# Patient Record
Sex: Female | Born: 1940 | Race: White | Hispanic: No | Marital: Single | Smoking: Never smoker
Health system: Southern US, Community
[De-identification: ages and names within clinical notes are randomized; demographics above are authoritative.]

## PROBLEM LIST (undated history)

## (undated) DIAGNOSIS — H9190 Unspecified hearing loss, unspecified ear: Secondary | ICD-10-CM

## (undated) DIAGNOSIS — R7989 Other specified abnormal findings of blood chemistry: Secondary | ICD-10-CM

## (undated) DIAGNOSIS — E785 Hyperlipidemia, unspecified: Secondary | ICD-10-CM

## (undated) DIAGNOSIS — R0609 Other forms of dyspnea: Secondary | ICD-10-CM

## (undated) DIAGNOSIS — I83813 Varicose veins of bilateral lower extremities with pain: Secondary | ICD-10-CM

## (undated) DIAGNOSIS — N183 Chronic kidney disease, stage 3 unspecified: Secondary | ICD-10-CM

## (undated) DIAGNOSIS — E559 Vitamin D deficiency, unspecified: Secondary | ICD-10-CM

## (undated) DIAGNOSIS — M8580 Other specified disorders of bone density and structure, unspecified site: Secondary | ICD-10-CM

## (undated) DIAGNOSIS — G4762 Sleep related leg cramps: Secondary | ICD-10-CM

## (undated) DIAGNOSIS — I10 Essential (primary) hypertension: Secondary | ICD-10-CM

## (undated) DIAGNOSIS — E2839 Other primary ovarian failure: Secondary | ICD-10-CM

## (undated) DIAGNOSIS — F4320 Adjustment disorder, unspecified: Secondary | ICD-10-CM

## (undated) DIAGNOSIS — G479 Sleep disorder, unspecified: Secondary | ICD-10-CM

## (undated) DIAGNOSIS — E538 Deficiency of other specified B group vitamins: Secondary | ICD-10-CM

## (undated) DIAGNOSIS — M19049 Primary osteoarthritis, unspecified hand: Secondary | ICD-10-CM

## (undated) DIAGNOSIS — Z91018 Allergy to other foods: Secondary | ICD-10-CM

## (undated) DIAGNOSIS — K219 Gastro-esophageal reflux disease without esophagitis: Secondary | ICD-10-CM

## (undated) DIAGNOSIS — K589 Irritable bowel syndrome without diarrhea: Secondary | ICD-10-CM

## (undated) DIAGNOSIS — M1990 Unspecified osteoarthritis, unspecified site: Secondary | ICD-10-CM

## (undated) DIAGNOSIS — K52831 Collagenous colitis: Secondary | ICD-10-CM

## (undated) HISTORY — DX: Hyperlipidemia, unspecified: E78.5

## (undated) HISTORY — DX: Deficiency of other specified B group vitamins: E53.8

## (undated) HISTORY — DX: Sleep related leg cramps: G47.62

## (undated) HISTORY — DX: Other forms of dyspnea: R06.09

## (undated) HISTORY — DX: Irritable bowel syndrome, unspecified: K58.9

## (undated) HISTORY — DX: Gastro-esophageal reflux disease without esophagitis: K21.9

## (undated) HISTORY — DX: Other specified abnormal findings of blood chemistry: R79.89

## (undated) HISTORY — DX: Sleep disorder, unspecified: G47.9

## (undated) HISTORY — PX: VAGINAL HYSTERECTOMY: SUR661

## (undated) HISTORY — DX: Adjustment disorder, unspecified: F43.20

## (undated) HISTORY — DX: Other primary ovarian failure: E28.39

## (undated) HISTORY — DX: Collagenous colitis: K52.831

## (undated) HISTORY — DX: Other specified disorders of bone density and structure, unspecified site: M85.80

## (undated) HISTORY — DX: Celiac disease: K90.0

## (undated) HISTORY — DX: Vitamin D deficiency, unspecified: E55.9

## (undated) HISTORY — DX: Chronic kidney disease, stage 3 unspecified: N18.30

## (undated) HISTORY — DX: Essential (primary) hypertension: I10

## (undated) HISTORY — DX: Primary osteoarthritis, unspecified hand: M19.049

## (undated) HISTORY — DX: Allergy to other foods: Z91.018

## (undated) HISTORY — DX: Varicose veins of bilateral lower extremities with pain: I83.813

## (undated) HISTORY — DX: Unspecified hearing loss, unspecified ear: H91.90

---

## 1998-03-17 ENCOUNTER — Observation Stay (HOSPITAL_COMMUNITY): Admission: RE | Admit: 1998-03-17 | Discharge: 1998-03-18 | Payer: Self-pay | Admitting: General Surgery

## 1999-02-06 HISTORY — PX: CATARACT EXTRACTION W/ INTRAOCULAR LENS  IMPLANT, BILATERAL: SHX1307

## 1999-02-06 HISTORY — PX: CHOLECYSTECTOMY: SHX55

## 2000-04-27 ENCOUNTER — Other Ambulatory Visit: Admission: RE | Admit: 2000-04-27 | Discharge: 2000-04-27 | Payer: Self-pay | Admitting: Obstetrics and Gynecology

## 2001-06-19 ENCOUNTER — Other Ambulatory Visit: Admission: RE | Admit: 2001-06-19 | Discharge: 2001-06-19 | Payer: Self-pay | Admitting: Obstetrics and Gynecology

## 2002-06-18 ENCOUNTER — Ambulatory Visit (HOSPITAL_COMMUNITY): Admission: RE | Admit: 2002-06-18 | Discharge: 2002-06-18 | Payer: Self-pay | Admitting: Obstetrics and Gynecology

## 2002-06-18 ENCOUNTER — Encounter: Payer: Self-pay | Admitting: Obstetrics and Gynecology

## 2002-06-26 ENCOUNTER — Encounter: Payer: Self-pay | Admitting: Obstetrics and Gynecology

## 2002-06-26 ENCOUNTER — Ambulatory Visit (HOSPITAL_COMMUNITY): Admission: RE | Admit: 2002-06-26 | Discharge: 2002-06-26 | Payer: Self-pay | Admitting: Obstetrics and Gynecology

## 2002-08-06 ENCOUNTER — Encounter: Payer: Self-pay | Admitting: General Surgery

## 2002-08-06 ENCOUNTER — Encounter: Payer: Self-pay | Admitting: Emergency Medicine

## 2002-08-06 ENCOUNTER — Inpatient Hospital Stay (HOSPITAL_COMMUNITY): Admission: EM | Admit: 2002-08-06 | Discharge: 2002-08-09 | Payer: Self-pay | Admitting: Emergency Medicine

## 2006-10-17 ENCOUNTER — Ambulatory Visit (HOSPITAL_COMMUNITY): Admission: RE | Admit: 2006-10-17 | Discharge: 2006-10-17 | Payer: Self-pay | Admitting: Internal Medicine

## 2007-09-04 ENCOUNTER — Ambulatory Visit: Payer: Self-pay | Admitting: Gastroenterology

## 2007-09-12 ENCOUNTER — Ambulatory Visit: Payer: Self-pay | Admitting: Internal Medicine

## 2007-10-31 ENCOUNTER — Telehealth: Payer: Self-pay | Admitting: Internal Medicine

## 2007-11-03 ENCOUNTER — Ambulatory Visit: Payer: Self-pay | Admitting: Internal Medicine

## 2007-11-03 LAB — CONVERTED CEMR LAB: BUN: 15 mg/dL (ref 6–23)

## 2007-11-06 ENCOUNTER — Ambulatory Visit: Payer: Self-pay | Admitting: Cardiovascular Disease

## 2007-11-13 ENCOUNTER — Telehealth: Payer: Self-pay | Admitting: Internal Medicine

## 2007-12-21 DIAGNOSIS — K589 Irritable bowel syndrome without diarrhea: Secondary | ICD-10-CM

## 2007-12-21 DIAGNOSIS — I10 Essential (primary) hypertension: Secondary | ICD-10-CM | POA: Insufficient documentation

## 2007-12-21 DIAGNOSIS — K5730 Diverticulosis of large intestine without perforation or abscess without bleeding: Secondary | ICD-10-CM

## 2007-12-21 DIAGNOSIS — E782 Mixed hyperlipidemia: Secondary | ICD-10-CM | POA: Insufficient documentation

## 2007-12-26 ENCOUNTER — Ambulatory Visit: Payer: Self-pay | Admitting: Internal Medicine

## 2007-12-26 LAB — HM COLONOSCOPY

## 2008-06-25 ENCOUNTER — Encounter: Payer: Self-pay | Admitting: Nurse Practitioner

## 2008-07-02 ENCOUNTER — Telehealth: Payer: Self-pay | Admitting: Internal Medicine

## 2008-07-04 ENCOUNTER — Ambulatory Visit: Payer: Self-pay | Admitting: Internal Medicine

## 2008-07-05 ENCOUNTER — Encounter: Payer: Self-pay | Admitting: Nurse Practitioner

## 2008-07-16 ENCOUNTER — Telehealth: Payer: Self-pay | Admitting: Nurse Practitioner

## 2008-07-22 ENCOUNTER — Ambulatory Visit: Payer: Self-pay | Admitting: Internal Medicine

## 2008-08-26 ENCOUNTER — Encounter: Admission: RE | Admit: 2008-08-26 | Discharge: 2008-08-26 | Payer: Self-pay | Admitting: Internal Medicine

## 2009-05-07 HISTORY — PX: ANTERIOR AND POSTERIOR REPAIR: SHX5121

## 2009-05-07 HISTORY — PX: BLADDER SUSPENSION: SHX72

## 2009-06-04 ENCOUNTER — Encounter (INDEPENDENT_AMBULATORY_CARE_PROVIDER_SITE_OTHER): Payer: Self-pay | Admitting: Obstetrics and Gynecology

## 2009-06-04 ENCOUNTER — Ambulatory Visit (HOSPITAL_COMMUNITY): Admission: RE | Admit: 2009-06-04 | Discharge: 2009-06-05 | Payer: Self-pay | Admitting: Obstetrics and Gynecology

## 2010-02-24 LAB — HM DEXA SCAN

## 2010-09-07 LAB — COMPREHENSIVE METABOLIC PANEL
CO2: 27 mEq/L (ref 19–32)
Calcium: 9.2 mg/dL (ref 8.4–10.5)
Creatinine, Ser: 0.7 mg/dL (ref 0.4–1.2)
GFR calc Af Amer: >60 mL/min (ref >60)
GFR calc non Af Amer: >60 mL/min (ref >60)
Glucose, Bld: 86 mg/dL (ref 70–99)
Total Protein: 6.4 g/dL (ref 6.0–8.3)

## 2010-09-07 LAB — DIFFERENTIAL
Lymphocytes Relative: 26 % (ref 12–46)
Lymphs Abs: 1.3 10*3/uL (ref 0.7–4.0)
Monocytes Relative: 8 % (ref 3–12)
Neutrophils Relative %: 60 % (ref 43–77)

## 2010-09-07 LAB — CBC
HCT: 31 % — ABNORMAL LOW (ref 36.0–46.0)
Hemoglobin: 10.6 g/dL — ABNORMAL LOW (ref 12.0–15.0)
Hemoglobin: 12 g/dL (ref 12.0–15.0)
MCHC: 34.7 g/dL (ref 30.0–36.0)
MCV: 96.8 fL (ref 78.0–100.0)
MCV: 99.1 fL (ref 78.0–100.0)
RBC: 3.13 MIL/uL — ABNORMAL LOW (ref 3.87–5.11)
RBC: 3.57 MIL/uL — ABNORMAL LOW (ref 3.87–5.11)
RDW: 12.6 % (ref 11.5–15.5)
WBC: 10.4 10*3/uL (ref 4.0–10.5)

## 2010-10-20 NOTE — Assessment & Plan Note (Signed)
[facility]                         GASTROENTEROLOGY OFFICE NOTE   NAME:Mayer, Ashley BANGERT                   MRN:          [id number]  DATE:09/04/2007                            DOB:          Jan 03, 1941    PRIMARY PHYSICIAN:  Lovenia Kim, D.O.   PROBLEM:  Rectal bleeding.   HISTORY:  Ashley Mayer is a pleasant 70 year old white female, known  remotely to Dr. Juanda Chance from screening colonoscopy which was apparently  done around 9 years ago.  The patient relates that this was a normal  exam.  I do not have that report available at the time of this office  visit.  The patient says she has been doing well, perhaps having an occasional  episode of urgency over the past few months and has noticed some  increase in mucus in her stools.  This past Friday, March 27th, she had  an episode of urgency followed by explosive diarrhea which was a stool  mixed with bright red blood.  She had a second episode about 1 hour  later which also contained blood but in a smaller amount.  She had no  associated pain, nausea, vomiting, diaphoresis, etcetera.  She denies  any recent rectal pain or hemorrhoid type symptoms.  She did not have a  bowel movement the following day nor yesterday.  Today, she has had one  bowel movement associated with some urgency but no obvious blood.  She  says she may have very mild lower abdominal soreness.  The patient had been taking a fairly high dose of ibuprofen the past  couple of weeks as she had some varicose vein surgery done.  She says  she was taking 6-7 ibuprofen per day but had stopped those late last  week.   CURRENT MEDICATIONS:  1. Quinapril 20 mg daily.  2. Estradiol 1 mg daily.  3. Simvastatin 20 mg daily.  4. Vitamin D supplement daily.  5. Magnesium 250 mg daily.  6. Omega-3 supplement daily.  7. Calcium plus D 1200 mg daily.  8. Something called a super shot which she is using for cholesterol      reduction.   ALLERGIES/INTOLERANCES:  No known drug allergies.   PAST MEDICAL HISTORY:  1. Hypertension.  2. Hyperlipidemia.  3. She is status post cholecystectomy 6-7 years ago.   FAMILY HISTORY:  Grandfather possibly with colon cancer.  Mother with  history of colon polyps.  Sister with diabetes.  There is heart disease  in her mother and a brother.   SOCIAL HISTORY:  The patient is divorced, has 2 children.  One of her  children is still at home.  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  She is a  nonsmoker, nondrinker.   REVIEW OF SYSTEMS:  Entirely negative other than GI as outlined above.   PHYSICAL EXAMINATION:  GENERAL:  Well developed white female in no acute  distress.  VITAL SIGNS:  Height is 5 feet 3 inches, weight is 132.6, blood pressure  148/66, pulse in the 80s.  HEENT:  Nontraumatic, normocephalic.  EOMI.  PERRLA.  Sclerae anicteric.  BACK:  She does have some  kyphosis.  CARDIOVASCULAR:  Regular rate and rhythm with S1 and S2.  No murmur,  rub, or gallop.  PULMONARY:  Clear to A&P.  ABDOMEN:  Soft.  She is minimally tender just to the right of midline in  the lower abdomen.  There is no palpable mass or hepatosplenomegaly.  RECTAL:  Negative.  Stool is Hemoccult negative.  There is no palpable  lesion.   IMPRESSION:  A 70 year old white female with recent urgency for bowel  movements and single episode of large volume hematochezia, etiology is  not clear.  She really has not had any associated pain making segmental  ischemic colitis less likely, though not impossible.  Rule out occult  colon lesion, internal hemorrhoid or polyp.   PLAN:  1. Schedule colonoscopy with Dr. Juanda Chance within the next couple of      weeks.  2. The patient has discontinued the ibuprofen and I have advised her      to remain off of this.      Mike Gip, PA-C  Electronically Signed      Rachael Fee, MD  Electronically Signed   AE/MedQ  DD: 09/04/2007  DT: 09/04/2007  Job #: [id number]    cc:   Hedwig Morton. Juanda Chance, MD

## 2010-10-23 NOTE — Discharge Summary (Signed)
NAME:  Ashley Mayer, Ashley Mayer                      ACCOUNT NO.:  [id number]   MEDICAL RECORD NO.:  [id number]                   PATIENT TYPE:  INP   LOCATION:  5016                                 FACILITY:  [facility]   PHYSICIAN:  Doug Sou, M.D.                DATE OF BIRTH:  02-22-1941   DATE OF ADMISSION:  08/06/2002  DATE OF DISCHARGE:  08/09/2002                                 DISCHARGE SUMMARY   FINAL DIAGNOSES:  1. Fall.  2. Multiple bilateral posterior rib fractures.  3. Posterior scalp laceration.  4. T9 compression fracture.  5. Bruising of the spinous process at C7.   HISTORY:  This is a 70 year old white female who was sitting on a foot  stool, lost her balance and fell striking the back of her head and her back  on a dresser.  There was no loss of consciousness.  She did __________.  She  was brought to [facility] emergency room and was worked up for back pain  after this fall in the minor side emergency room.  Several rib fractures  were discovered and subsequently trauma I __________.  She was seen by Dr.  Violeta Gelinas.  The patient complained of pain in the lower thoracic area  of her back and of slight headache.  Work up was done.  She was noted to  have a T9 mild compression fracture.  She had a posterior scalp laceration  which was closed with staples.  She had multiple posterior rib fractures.  She was subsequently hospitalized.   HOSPITAL COURSE:  The patient was hospitalized.  The following day a Jewitt  brace was ordered for the patient to use while up.  This was done.  She was  told to put this on properly.  At this point, the following day, March 4,  she was ready for discharge.  At this point she noted to have some  tenderness of the C7 spinous process.  Subsequently a C-spine was done which  was negative and then she was discharged home.   DISCHARGE MEDICATIONS:  The patient was given Percocet one to two p.o. every  four to six hours p.r.n.  pain.   FOLLOW UP:  She is to follow up with [place] on Tuesday, March 9 for removal of  the staples.   DISCHARGE INSTRUCTIONS:  She is to continue to use her brace for  approximately six to eight weeks.  We will review films on her approximately  in six weeks to check out the healing of the T9 vertebral body.   CONDITION ON DISCHARGE:  She is subsequently discharged home in satisfactory  and stable condition on August 09, 2002.     Phineas Semen, P.A.                      Doug Sou, M.D.    CL/MEDQ  D:  08/09/2002  T:  08/10/2002  Job:  [id number]   cc:   Clement Sayres, M.D.

## 2010-10-23 NOTE — H&P (Signed)
NAME:  Ashley Mayer, Ashley Mayer                      ACCOUNT NO.:  [id number]   MEDICAL RECORD NO.:  [id number]                   PATIENT TYPE:  INP   LOCATION:  1830                                 FACILITY:  [facility]   PHYSICIAN:  Gabrielle Dare. Janee Morn, M.D.             DATE OF BIRTH:  Jul 04, 1940   DATE OF ADMISSION:  08/06/2002  DATE OF DISCHARGE:                                HISTORY & PHYSICAL   CHIEF COMPLAINT:  Back pain after a fall.   HISTORY OF PRESENT ILLNESS:  The patient is a 70 year old white female who  was standing on a foot stool, lost her balance and fell striking the back of  her head and her back on the dresser. There was no LOC.  She was not a  trauma activation. She was being worked up for back pain after this fall in  the minor side of the emergency department when several rib fractures were  discovered and we were consulted.  Currently the patient complains of pain  in the lower thoracic area of her back and of a slight headache. She has no  other complaints.   PAST MEDICAL HISTORY:  She denies family history of heart disease, COPD,  strokes, or diabetes run in her family.  She also has a brother with some  bladder cancer.   PRIMARY CARE PHYSICIAN:  Her primary care physician is Dr. Marina Goodell.   CURRENT MEDICATIONS:  Aspirin 81 mg daily   PAST SURGICAL HISTORY:  Significant for a cholecystectomy.   SOCIAL HISTORY:  She does not drink alcohol or smoke cigarettes.   ALLERGIES:  No known drug allergies.   PHYSICAL EXAMINATION:  VITAL SIGNS:  Pulse is 80, blood pressure 166, 67,  respirations 24 and temperature 97.2; 98% saturation on room air.  SKIN:  Her skin is dry.  HEENT:  Demonstrate a 4-cm laceration to the posterior scalp.  This had  already been cleaned and stapled by the emergency department personnel.  Her  face was atraumatic.  Eyes demonstrated intact extraocular movements.  Pupils are 2+ and reactive bilaterally.  Ears were normal externally and  tympanic membranes were intact with no hemotympanum bilaterally.  The mouth  showed no laceration or fracture.  NECK:  Her neck is nontender with no swelling or crepitus.  The trachea was  in the midline.  LUNGS:  Lungs were clear to auscultation bilaterally.  HEART:  Heart is regular rate and rhythm.  ABDOMEN:  Abdomen is soft, nontender and nondistended.  BACK:  Back demonstrated some tenderness over her mid-to-lower T-spine and  in the lateral paraspinous area.  No step-offs were palpable.  EXTREMITIES:  She is moving all extremities; all without edema.  NEUROLOGIC:  Orientation and memory are intact.  Cranial nerves II-XII are  intact.  Facial and motor are intact in upper and lower extremities.  Vascular exam demonstrates 2+ pulses throughout.   X-RAY AND LABORATORY DATA:  Sodium was  135, potassium 4.0, chloride 104, CO2  of 24, BUN 12, creatinine 0.6, and glucose 144.  Hemoglobin 13, hematocrit  39.  __________ ultrasound demonstrated a questionable small amount of fluid  in Morrison's pouch, none in her left upper quadrant.  Chest x-ray  demonstrated bilateral pertinently T11, T12, rib fractures of L1, transverse  process fracture and T9 compression fracture.  These were better visualized  on some thoracic spine films.  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  CT scan of the  abdomen and pelvis, again, showed the T9 compression fracture, but showed no  free fluid and no solid organ injuries.  l   IMPRESSION:  1. Fall with multiple bilateral posterior rib fractures.  2. Posterior scalp laceration.  3. T9 compression fracture.   PLAN:  The plan is to admit for observation.  We are going to have a biotech  evaluate her for a T-spine brace.  I gave her some pain medicine and muscle  relaxants. I also spoke with Dr. Eulah Pont with regards to her T9 compression  fracture.                                                Gabrielle Dare Janee Morn, M.D.    BET/MEDQ  D:  08/06/2002  T:  08/06/2002  Job:  [id number]

## 2011-06-08 LAB — HM DIABETES EYE EXAM

## 2012-05-22 ENCOUNTER — Encounter: Payer: Self-pay | Admitting: *Deleted

## 2012-05-22 ENCOUNTER — Encounter: Payer: Medicare HMO | Attending: Internal Medicine | Admitting: *Deleted

## 2012-05-22 VITALS — Ht 63.0 in | Wt 130.0 lb

## 2012-05-22 DIAGNOSIS — Z91018 Allergy to other foods: Secondary | ICD-10-CM

## 2012-05-22 DIAGNOSIS — Z713 Dietary counseling and surveillance: Secondary | ICD-10-CM | POA: Insufficient documentation

## 2012-05-22 NOTE — Progress Notes (Signed)
  Medical Nutrition Therapy:  Appt start time: 0930 end time:  1030.   Assessment:  Primary concerns today: food allergies.   MEDICATIONS: see list   DIETARY INTAKE:  Usual eating pattern includes 3 meals and 0-2 snacks per day.  Everyday foods include starches, proteins, fruits, and vegetables.  Avoided foods include allergy trigger foods.    24-hr recall:  B ( AM): waffle with syrup and hot tea or sausage burrito Snk ( AM): none usually  L ( PM): grilled chicken salad with tomatoes.  Sandwich or meat and vegetables Snk ( PM): may have some cookies and tea or fruit D ( PM): peanut butter and banana sandwich on white wheat bread Snk ( PM): none usually Beverages: tea, water, rarely soda  Usual physical activity: working still. Walks at work.  Likes to be busy- doesn't sit   Estimated energy needs: 1600 calories   Progress Towards Goal(s):  In progress.   Nutritional Diagnosis:  NB-1.1 Food and nutrition-related knowledge deficit As related to wheat and dairy food alternatives.  As evidenced by self-reported knowledge deficit about food allergies    Intervention:  Nutrition counseling provided.  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  She very seldom eats shrimp, nor does she drink milk or eat cheese.  However, she is concerned about the wheat allergy and she does like tomatoes and ice cream.  Discussed wheat alternatives like oat, corn, potato, rye, and barley.  Ashley Mayer has found that wheat in small amounts doesn't bother her, so a multigrain bread or cracker wouldn't cause problems.  Discussed alternatives for her ice cream like sorbet or fruit popsicles and suggested pesto sauce instead of tomato-based pasta sauce  Handouts given during visit include:  Celiac disease nutrition therapy ([name] doesn't have CD, but the wheat alternatives listed on this handout are applicable)  Monitoring/Evaluation:  Dietary intake, exercise,  allergic reactions, and body weight prn.

## 2012-05-22 NOTE — Patient Instructions (Addendum)
Try almond milk - vanilla flavor- for cooking or baking Try calcium fortified orange juice  Breads:  Potato or oatbran or rye or maybe multigrain  Cereal: Oatmeal Grits Chex: rice or corn Corn flakes Bran flakes, raisin bran Cracklin Oat Bran Cheerios  Other starches: Rice, potato  Fiber: Fruits, vegetables, beans, nuts  Crackers: multigrain or 5 grain  Pasta sauces:  pesto with rice noodles or gluten free pasta  Ice cream alternative: Sherbet, or popsicles or sorbet (totally milk free)

## 2012-10-02 LAB — FECAL OCCULT BLOOD, GUAIAC: Fecal Occult Blood: NEGATIVE

## 2013-04-16 ENCOUNTER — Encounter: Payer: Self-pay | Admitting: Internal Medicine

## 2013-04-17 ENCOUNTER — Encounter: Payer: Self-pay | Admitting: Emergency Medicine

## 2013-04-17 ENCOUNTER — Ambulatory Visit: Payer: Commercial Managed Care - HMO | Admitting: Emergency Medicine

## 2013-04-17 VITALS — BP 132/64 | HR 56 | Temp 97.8°F | Resp 16 | Ht 61.0 in | Wt 126.0 lb

## 2013-04-17 DIAGNOSIS — R7309 Other abnormal glucose: Secondary | ICD-10-CM

## 2013-04-17 DIAGNOSIS — B351 Tinea unguium: Secondary | ICD-10-CM

## 2013-04-17 DIAGNOSIS — R5381 Other malaise: Secondary | ICD-10-CM

## 2013-04-17 DIAGNOSIS — E782 Mixed hyperlipidemia: Secondary | ICD-10-CM

## 2013-04-17 DIAGNOSIS — I10 Essential (primary) hypertension: Secondary | ICD-10-CM

## 2013-04-17 DIAGNOSIS — E559 Vitamin D deficiency, unspecified: Secondary | ICD-10-CM

## 2013-04-17 DIAGNOSIS — Z0000 Encounter for general adult medical examination without abnormal findings: Secondary | ICD-10-CM

## 2013-04-17 DIAGNOSIS — E538 Deficiency of other specified B group vitamins: Secondary | ICD-10-CM

## 2013-04-17 DIAGNOSIS — R51 Headache: Secondary | ICD-10-CM

## 2013-04-17 LAB — CBC WITH DIFFERENTIAL/PLATELET
Basophils Absolute: 0 10*3/uL (ref 0.0–0.1)
Basophils Relative: 1 % (ref 0–1)
Eosinophils Relative: 3 % (ref 0–5)
HCT: 38.2 % (ref 36.0–46.0)
Lymphocytes Relative: 25 % (ref 12–46)
Lymphs Abs: 1.4 10*3/uL (ref 0.7–4.0)
MCV: 96.7 fL (ref 78.0–100.0)
Monocytes Absolute: 0.7 10*3/uL (ref 0.1–1.0)
RBC: 3.95 MIL/uL (ref 3.87–5.11)
RDW: 13.2 % (ref 11.5–15.5)
WBC: 5.5 10*3/uL (ref 4.0–10.5)

## 2013-04-17 LAB — HEPATIC FUNCTION PANEL
ALT: 18 U/L (ref 0–35)
AST: 21 U/L (ref 0–37)
Bilirubin, Direct: 0.1 mg/dL (ref 0.0–0.3)
Total Protein: 6.3 g/dL (ref 6.0–8.3)

## 2013-04-17 LAB — BASIC METABOLIC PANEL WITH GFR
BUN: 15 mg/dL (ref 6–23)
CO2: 28 mEq/L (ref 19–32)
Calcium: 9.3 mg/dL (ref 8.4–10.5)
Chloride: 104 mEq/L (ref 96–112)
Creat: 0.74 mg/dL (ref 0.50–1.10)
GFR, Est African American: >89 mL/min
GFR, Est Non African American: 81 mL/min
Glucose, Bld: 86 mg/dL (ref 70–99)
Potassium: 4.4 mEq/L (ref 3.5–5.3)
Sodium: 138 mEq/L (ref 135–145)

## 2013-04-17 LAB — LIPID PANEL
Cholesterol: 168 mg/dL (ref 0–200)
HDL: 60 mg/dL (ref >39)
LDL Cholesterol: 88 mg/dL (ref 0–99)
Total CHOL/HDL Ratio: 2.8 Ratio
Triglycerides: 102 mg/dL (ref <150)
VLDL: 20 mg/dL (ref 0–40)

## 2013-04-17 LAB — HEMOGLOBIN A1C
Hgb A1c MFr Bld: 5.1 % (ref <5.7)
Mean Plasma Glucose: 100 mg/dL (ref <117)

## 2013-04-17 NOTE — Patient Instructions (Signed)
Allergic Rhinitis Allergic rhinitis is when the mucous membranes in the nose respond to allergens. Allergens are particles in the air that cause your body to have an allergic reaction. This causes you to release allergic antibodies. Through a chain of events, these eventually cause you to release histamine into the blood stream (hence the use of antihistamines). Although meant to be protective to the body, it is this release that causes your discomfort, such as frequent sneezing, congestion and an itchy runny nose.  CAUSES  The pollen allergens may come from grasses, trees, and weeds. This is seasonal allergic rhinitis, or "hay fever." Other allergens cause year-round allergic rhinitis (perennial allergic rhinitis) such as house dust mite allergen, pet dander and mold spores.  SYMPTOMS   Nasal stuffiness (congestion).  Runny, itchy nose with sneezing and tearing of the eyes.  There is often an itching of the mouth, eyes and ears. It cannot be cured, but it can be controlled with medications. DIAGNOSIS  If you are unable to determine the offending allergen, skin or blood testing may find it. TREATMENT   Avoid the allergen.  Medications and allergy shots (immunotherapy) can help.  Hay fever may often be treated with antihistamines in pill or nasal spray forms. Antihistamines block the effects of histamine. There are over-the-counter medicines that may help with nasal congestion and swelling around the eyes. Check with your caregiver before taking or giving this medicine. If the treatment above does not work, there are many new medications your caregiver can prescribe. Stronger medications may be used if initial measures are ineffective. Desensitizing injections can be used if medications and avoidance fails. Desensitization is when a patient is given ongoing shots until the body becomes less sensitive to the allergen. Make sure you follow up with your caregiver if problems continue. SEEK MEDICAL  CARE IF:   You develop fever (more than 100.5 F (38.1 C).  You develop a cough that does not stop easily (persistent).  You have shortness of breath.  You start wheezing.  Symptoms interfere with normal daily activities. Document Released: 02/16/2001 Document Revised: 08/16/2011 Document Reviewed: 08/28/2008 [facility] Patient Information 2014 [place], [place]. Hypertension Hypertension is another name for high blood pressure. High blood pressure may mean that your heart needs to work harder to pump blood. Blood pressure consists of two numbers, which includes a higher number over a lower number (example: 110/72). HOME CARE   Make lifestyle changes as told by your doctor. This may include weight loss and exercise.  Take your blood pressure medicine every day.  Limit how much salt you use.  Stop smoking if you smoke.  Do not use drugs.  Talk to your doctor if you are using decongestants or birth control pills. These medicines might make blood pressure higher.  Females should not drink more than 1 alcoholic drink per day. Males should not drink more than 2 alcoholic drinks per day.  See your doctor as told. GET HELP RIGHT AWAY IF:   You have a blood pressure reading with a top number of 180 or higher.  You get a very bad headache.  You get blurred or changing vision.  You feel confused.  You feel weak, numb, or faint.  You get chest or belly (abdominal) pain.  You throw up (vomit).  You cannot breathe very well. MAKE SURE YOU:   Understand these instructions.  Will watch your condition.  Will get help right away if you are not doing well or get worse. Document Released: 11/10/2007  Document Revised: 08/16/2011 Document Reviewed: 11/10/2007 [facility] Patient Information 2014 [place], [place].

## 2013-04-18 ENCOUNTER — Telehealth: Payer: Self-pay | Admitting: *Deleted

## 2013-04-18 LAB — MICROALBUMIN / CREATININE URINE RATIO
Creatinine, Urine: 185.5 mg/dL
Microalb, Ur: 1.54 mg/dL (ref 0.00–1.89)

## 2013-04-18 LAB — URINALYSIS, MICROSCOPIC ONLY
Crystals: NONE SEEN
Squamous Epithelial / LPF: NONE SEEN

## 2013-04-18 LAB — URINALYSIS, ROUTINE W REFLEX MICROSCOPIC
Bilirubin Urine: NEGATIVE
Glucose, UA: NEGATIVE mg/dL
Hgb urine dipstick: NEGATIVE
Ketones, ur: NEGATIVE mg/dL
Protein, ur: NEGATIVE mg/dL
Specific Gravity, Urine: 1.02 (ref 1.005–1.030)
Urobilinogen, UA: 1 mg/dL (ref 0.0–1.0)

## 2013-04-18 LAB — INSULIN, FASTING: Insulin fasting, serum: 8 u[IU]/mL (ref 3–28)

## 2013-04-18 LAB — VITAMIN D 25 HYDROXY (VIT D DEFICIENCY, FRACTURES): Vit D, 25-Hydroxy: 72 ng/mL (ref 30–89)

## 2013-04-18 NOTE — Progress Notes (Signed)
Subjective:    Patient ID: Ashley Mayer, female    DOB: [birth year], 72 y.o.   MRN: [id number]  HPI Comments: 72 yo female for CPE and 3 month F/U for HTN, Cholesterol, Pre-Dm, D. Deficient. She has concerns with chronic fatigue with occasional insomnia, mild ST from yard work yesterday, and tenderness in her temple region bilaterally. She is eating healthy and exercising regularly with yard/ house work. BP is good at home. She wants to get moles removed that are irritated from clothing and shaving.    Current Outpatient Prescriptions on File Prior to Visit  Medication Sig Dispense Refill  . Calcium Carbonate-Vitamin D (CALCIUM + D PO) Take by mouth.      . estradiol (ESTRACE) 1 MG tablet       . fish oil-omega-3 fatty acids 1000 MG capsule Take 2 g by mouth daily.      . Multiple Vitamin (MULTIVITAMIN) tablet Take 1 tablet by mouth daily.      . multivitamin-lutein (OCUVITE-LUTEIN) CAPS Take 1 capsule by mouth daily.      . quinapril (ACCUPRIL) 20 MG tablet       . simvastatin (ZOCOR) 20 MG tablet        No current facility-administered medications on file prior to visit.  LAmisil 250 mg QD  (Restarted recently)  ALLERGIES Codeine and Food  Past Medical History  Diagnosis Date  . Multiple food allergies   . Hyperlipidemia   . Hypertension   . GERD (gastroesophageal reflux disease)   . IBS (irritable bowel syndrome)   . Unspecified vitamin D deficiency   . Osteopenia     Past Surgical History  Procedure Laterality Date  . Cholecystectomy    . Abdominal hysterectomy    . Eye surgery      cataract with lens repl  . Bladder suspension      History   Social History  . Marital Status: Single    Spouse Name: N/A    Number of Children: N/A  . Years of Education: N/A   Social History Main Topics  . Smoking status: Never Smoker   . Smokeless tobacco: None  . Alcohol Use: No  . Drug Use: No  . Sexual Activity: None   Other Topics Concern  . None   Social  History Narrative  . None   Family History  Problem Relation Age of Onset  . Stroke Other   . Hypertension Other   . Hyperlipidemia Other   . Cancer Other   . Asthma Other   . Heart attack Other   . Stroke Mother   . Hyperlipidemia Mother   . Cancer Father     mesothelioma  . Diabetes Sister   . Heart disease Brother   . Hyperlipidemia Brother   . Hypertension Brother   . Stroke Brother   . Hemochromatosis Son   . Cancer Brother     bladder with mets to kidneys     Review of Systems  Constitutional: Positive for fatigue.  HENT: Positive for postnasal drip and sore throat.   Musculoskeletal: Negative.   Skin: Positive for rash.  Psychiatric/Behavioral: Positive for sleep disturbance.  All other systems reviewed and are negative.   BP 132/64  Pulse 56  Temp(Src) 97.8 F (36.6 C) (Temporal)  Resp 16  Ht 5\' 1"  (1.549 m)  Wt 126 lb (57.153 kg)  BMI 23.82 kg/m2     Objective:   Physical Exam  Nursing note and vitals reviewed. Constitutional: She is  oriented to person, place, and time. She appears well-developed and well-nourished.  HENT:  Head: Normocephalic and atraumatic.  Right Ear: External ear normal.  Left Ear: External ear normal.  Nose: Nose normal.  Mouth/Throat: Oropharynx is clear and moist.  + popping with open/ close of TMJ + cloudy TM's  + Cobblestones P. Pharynx  Eyes: Conjunctivae and EOM are normal. Pupils are equal, round, and reactive to light.  Dr. Emmit Pomfret yearly eval  Neck: Normal range of motion. Neck supple. No JVD present. No thyromegaly present.  Cardiovascular: Normal rate, regular rhythm, normal heart sounds and intact distal pulses.   Varicose veins Bil LE soft, NT ND  Pulmonary/Chest: Effort normal and breath sounds normal.  Abdominal: Soft. Bowel sounds are normal. She exhibits no distension and no mass. There is no tenderness.  Dr. Juanda Chance PRN  Genitourinary:  Def Dr. Lang Snow  Musculoskeletal: Normal range of motion.   Lymphadenopathy:    She has no cervical adenopathy.  Neurological: She is alert and oriented to person, place, and time. She has normal reflexes. No cranial nerve deficit. Coordination normal.  Skin: Skin is warm and dry.  Ant. Chest 4mm scaling dark Upper back 5 mm scaling erythematous mild elevated Right post. Calf 5 mm elevated scaling with irritation Bilateral great toes with thickness distally Dr. Terri Piedra yearly eval  Psychiatric: She has a normal mood and affect. Her behavior is normal. Judgment and thought content normal.    EKG NSCSPT, WNL      Assessment & Plan:  1. CPE, 3 month F/U for HTN, Cholesterol, Pre-Dm, D. Deficient check labs 2. Irregular Nevi- schedule removal 3. Allergic Rhinitis- Add allegra AD 4. 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

## 2013-04-18 NOTE — Telephone Encounter (Signed)
Message copied by Nicholaus Corolla A on Wed Apr 18, 2013 10:07 AM ------      Message from: [place], [place] R      Created: Wed Apr 18, 2013  5:59 AM       All labs WNL continue the same. Call if TMJ does not improve. ------

## 2013-04-19 NOTE — Telephone Encounter (Signed)
Spoke with pt about recent lab results.  Advised to call if TMJ symptoms do not improve.

## 2013-04-26 ENCOUNTER — Other Ambulatory Visit: Payer: Self-pay | Admitting: Emergency Medicine

## 2013-05-29 ENCOUNTER — Other Ambulatory Visit: Payer: Self-pay | Admitting: Internal Medicine

## 2013-07-04 ENCOUNTER — Encounter: Payer: Self-pay | Admitting: Physician Assistant

## 2013-07-04 ENCOUNTER — Ambulatory Visit (INDEPENDENT_AMBULATORY_CARE_PROVIDER_SITE_OTHER): Payer: Medicare HMO | Admitting: Physician Assistant

## 2013-07-04 VITALS — BP 142/78 | HR 60 | Temp 98.1°F | Resp 16 | Ht 62.5 in | Wt 129.0 lb

## 2013-07-04 DIAGNOSIS — M7710 Lateral epicondylitis, unspecified elbow: Secondary | ICD-10-CM

## 2013-07-04 DIAGNOSIS — F411 Generalized anxiety disorder: Secondary | ICD-10-CM

## 2013-07-04 DIAGNOSIS — I10 Essential (primary) hypertension: Secondary | ICD-10-CM

## 2013-07-04 MED ORDER — QUINAPRIL HCL 40 MG PO TABS: 40.0000 mg | ORAL_TABLET | Freq: Every day | ORAL | Status: DC

## 2013-07-04 MED ORDER — ALPRAZOLAM 0.5 MG PO TABS: 0.5000 mg | ORAL_TABLET | Freq: Two times a day (BID) | ORAL | Status: DC | PRN

## 2013-07-04 NOTE — Progress Notes (Signed)
Subjective:    Patient ID: Ashley Mayer, female    DOB: 11/09/1940, 73 y.o.   MRN: [id number]  HPI Patient presents for BP follow up. She states at home and at work it has been running in the high 140's/78 with the highest being 162/73. She has not been sleeping well since Friday or Saturday, she has been waking up 2-3 times in the morning. For the past 2 nights she was having headaches, left arm pain and she took 1/2 of her quinapril, 3 low dose ASA, and tylenol. She is normally very active, walks every Wednesday and denies any CP, SOB, nausea, palpiations, dizziness, sweating, or fatigue. Left arm pain, feels like a pressure or pinch at her lateral epicondial with some pain at bicep and forearm. Increasing stress with her best friend having brain cancer and she has been at the hosptial several days.   Current Outpatient Prescriptions on File Prior to Visit  Medication Sig Dispense Refill  . aspirin 81 MG tablet Take 81 mg by mouth daily.      . benzonatate (TESSALON) 100 MG capsule TAKE TWO CAPSULES BY MOUTH THREE TIMES DAILY FOR COUGH  60 capsule  0  . Calcium Carbonate-Vitamin D (CALCIUM + D PO) Take by mouth.      . estradiol (ESTRACE) 1 MG tablet TAKE 1 TABLET DAILY  90 tablet  3  . fish oil-omega-3 fatty acids 1000 MG capsule Take 2 g by mouth daily.      . Multiple Vitamin (MULTIVITAMIN) tablet Take 1 tablet by mouth daily.      . multivitamin-lutein (OCUVITE-LUTEIN) CAPS Take 1 capsule by mouth daily.      . quinapril (ACCUPRIL) 20 MG tablet TAKE 1 TABLET DAILY FOR BLOOD PRESSURE  90 tablet  1  . simvastatin (ZOCOR) 20 MG tablet TAKE 1 TABLET DAILY FOR CHOLESTEROL  90 tablet  1  . terbinafine (LAMISIL) 250 MG tablet Take 250 mg by mouth daily.       No current facility-administered medications on file prior to visit.   Past Medical History  Diagnosis Date  . Multiple food allergies   . Hyperlipidemia   . Hypertension   . GERD (gastroesophageal reflux disease)   . IBS  (irritable bowel syndrome)   . Unspecified vitamin D deficiency   . Osteopenia    Review of Systems     Objective:   Physical Exam  Constitutional: She is oriented to person, place, and time. She appears well-developed and well-nourished.  HENT:  Head: Normocephalic and atraumatic.  Right Ear: External ear normal.  Left Ear: External ear normal.  Mouth/Throat: Oropharynx is clear and moist.  Eyes: Conjunctivae and EOM are normal. Pupils are equal, round, and reactive to light.  Neck: Normal range of motion. Neck supple. No thyromegaly present.  Cardiovascular: Normal rate, regular rhythm and normal heart sounds.  Exam reveals no gallop and no friction rub.   No murmur heard. Pulmonary/Chest: Effort normal and breath sounds normal. No respiratory distress. She has no wheezes.  Abdominal: Soft. Bowel sounds are normal. She exhibits no distension and no mass. There is no tenderness. There is no rebound and no guarding.  Musculoskeletal: Normal range of motion.  Left lateral epicondyl pain, full ROM, 5/5 strength, good pulses and sensation distal.   Lymphadenopathy:    She has no cervical adenopathy.  Neurological: She is alert and oriented to person, place, and time. She displays normal reflexes. No cranial nerve deficit. Coordination normal.  Skin: Skin  is warm and dry.  Psychiatric: She has a normal mood and affect.   Filed Vitals:   07/04/13 1611  BP: 142/78  Pulse: 60  Temp: 98.1 F (36.7 C)  Resp: 16      Assessment & Plan:  Hypertension- no changes in vision/speech, ha, dizziness- increase quinapril to 40mg  daily.  Left arm pain- nonexertional, no accompaniment- likely Left epicondylitis- ice, rest, brace- if not better will get a shot and if she has any chest pain, SOB, or change in exercise capacity she will go to the ER. Stress/decreased sleep- xanax 0.5mg  1/2-1 PRN # 60 NR.

## 2013-07-04 NOTE — Patient Instructions (Signed)
DASH Diet The DASH diet stands for "Dietary Approaches to Stop Hypertension." It is a healthy eating plan that has been shown to reduce high blood pressure (hypertension) in as little as 14 days, while also possibly providing other significant health benefits. These other health benefits include reducing the risk of breast cancer after menopause and reducing the risk of type 2 diabetes, heart disease, colon cancer, and stroke. Health benefits also include weight loss and slowing kidney failure in patients with chronic kidney disease.  DIET GUIDELINES  Limit salt (sodium). Your diet should contain less than 1500 mg of sodium daily.  Limit refined or processed carbohydrates. Your diet should include mostly whole grains. Desserts and added sugars should be used sparingly.  Include small amounts of heart-healthy fats. These types of fats include nuts, oils, and tub margarine. Limit saturated and trans fats. These fats have been shown to be harmful in the body. CHOOSING FOODS  The following food groups are based on a 2000 calorie diet. See your Registered Dietitian for individual calorie needs. Grains and Grain Products (6 to 8 servings daily)  Eat More Often: Whole-wheat bread, brown rice, whole-grain or wheat pasta, quinoa, popcorn without added fat or salt (air popped).  Eat Less Often: White bread, white pasta, white rice, cornbread. Vegetables (4 to 5 servings daily)  Eat More Often: Fresh, frozen, and canned vegetables. Vegetables may be raw, steamed, roasted, or grilled with a minimal amount of fat.  Eat Less Often/Avoid: Creamed or fried vegetables. Vegetables in a cheese sauce. Fruit (4 to 5 servings daily)  Eat More Often: All fresh, canned (in natural juice), or frozen fruits. Dried fruits without added sugar. One hundred percent fruit juice ( cup [237 mL] daily).  Eat Less Often: Dried fruits with added sugar. Canned fruit in light or heavy syrup. [employer], Fish, and Poultry (2  servings or less daily. One serving is 3 to 4 oz [85-114 g]).  Eat More Often: Ninety percent or leaner ground beef, tenderloin, sirloin. Round cuts of beef, chicken breast, [place] breast. All fish. Grill, bake, or broil your meat. Nothing should be fried.  Eat Less Often/Avoid: Fatty cuts of meat, [place], or chicken leg, thigh, or wing. Fried cuts of meat or fish. Dairy (2 to 3 servings)  Eat More Often: Low-fat or fat-free milk, low-fat plain or light yogurt, reduced-fat or part-skim cheese.  Eat Less Often/Avoid: Milk (whole, 2%).Whole milk yogurt. Full-fat cheeses. Nuts, Seeds, and Legumes (4 to 5 servings per week)  Eat More Often: All without added salt.  Eat Less Often/Avoid: Salted nuts and seeds, canned beans with added salt. Fats and Sweets (limited)  Eat More Often: Vegetable oils, tub margarines without trans fats, sugar-free gelatin. Mayonnaise and salad dressings.  Eat Less Often/Avoid: Coconut oils, palm oils, butter, stick margarine, cream, half and half, cookies, candy, pie. FOR MORE INFORMATION The Dash Diet Eating Plan: www.dashdiet.org Document Released: 05/13/2011 Document Revised: 08/16/2011 Document Reviewed: 05/13/2011 [facility] Patient Information 2014 [place], [place]. Lateral Epicondylitis (Tennis Elbow) with Rehab Lateral epicondylitis involves inflammation and pain around the outer portion of the elbow. The pain is caused by inflammation of the tendons in the forearm that bring back (extend) the wrist. Lateral epicondylittis is also called tennis elbow, because it is very common in tennis players. However, it may occur in any individual who extends the wrist repetitively. If lateral epicondylitis is left untreated, it may become a chronic problem. SYMPTOMS   Pain, tenderness, and inflammation on the outer (lateral) side  of the elbow.  Pain or weakness with gripping activities.  Pain that increases with wrist twisting motions (playing tennis, using a  screwdriver, opening a door or a jar).  Pain with lifting objects, including a coffee cup. CAUSES  Lateral epicondylitis is caused by inflammation of the tendons that extend the wrist. Causes of injury may include:  Repetitive stress and strain on the muscles and tendons that extend the wrist.  Sudden change in activity level or intensity.  Incorrect grip in racquet sports.  Incorrect grip size of racquet (often too large).  Incorrect hitting position or technique (usually backhand, leading with the elbow).  Using a racket that is too heavy. RISK INCREASES WITH:  Sports or occupations that require repetitive and/or strenuous forearm and wrist movements (tennis, squash, racquetball, carpentry).  Poor wrist and forearm strength and flexibility.  Failure to warm up properly before activity.  Resuming activity before healing, rehabilitation, and conditioning are complete. PREVENTION   Warm up and stretch properly before activity.  Maintain physical fitness:  Strength, flexibility, and endurance.  Cardiovascular fitness.  Wear and use properly fitted equipment.  Learn and use proper technique and have a coach correct improper technique.  Wear a tennis elbow (counterforce) brace. PROGNOSIS  The course of this condition depends on the degree of the injury. If treated properly, acute cases (symptoms lasting less than 4 weeks) are often resolved in 2 to 6 weeks. Chronic (longer lasting cases) often resolve in 3 to 6 months, but may require physical therapy. RELATED COMPLICATIONS   Frequently recurring symptoms, resulting in a chronic problem. Properly treating the problem the first time decreases frequency of recurrence.  Chronic inflammation, scarring tendon degeneration, and partial tendon tear, requiring surgery.  Delayed healing or resolution of symptoms. TREATMENT  Treatment first involves the use of ice and medicine, to reduce pain and inflammation. Strengthening and  stretching exercises may help reduce discomfort, if performed regularly. These exercises may be performed at home, if the condition is an acute injury. Chronic cases may require a referral to a physical therapist for evaluation and treatment. Your caregiver may advise a corticosteroid injection, to help reduce inflammation. Rarely, surgery is needed. MEDICATION  If pain medicine is needed, nonsteroidal anti-inflammatory medicines (aspirin and ibuprofen), or other minor pain relievers (acetaminophen), are often advised.  Do not take pain medicine for 7 days before surgery.  Prescription pain relievers may be given, if your caregiver thinks they are needed. Use only as directed and only as much as you need.  Corticosteroid injections may be recommended. These injections should be reserved only for the most severe cases, because they can only be given a certain number of times. HEAT AND COLD  Cold treatment (icing) should be applied for 10 to 15 minutes every 2 to 3 hours for inflammation and pain, and immediately after activity that aggravates your symptoms. Use ice packs or an ice massage.  Heat treatment may be used before performing stretching and strengthening activities prescribed by your caregiver, physical therapist, or athletic trainer. Use a heat pack or a warm water soak. SEEK MEDICAL CARE IF: Symptoms get worse or do not improve in 2 weeks, despite treatment. EXERCISES  RANGE OF MOTION (ROM) AND STRETCHING EXERCISES - Epicondylitis, Lateral (Tennis Elbow) These exercises may help you when beginning to rehabilitate your injury. Your symptoms may go away with or without further involvement from your physician, physical therapist or athletic trainer. While completing these exercises, remember:   Restoring tissue flexibility helps normal motion  to return to the joints. This allows healthier, less painful movement and activity.  An effective stretch should be held for at least 30  seconds.  A stretch should never be painful. You should only feel a gentle lengthening or release in the stretched tissue. RANGE OF MOTION  Wrist Flexion, Active-Assisted  Extend your right / left elbow with your fingers pointing down.*  Gently pull the back of your hand towards you, until you feel a gentle stretch on the top of your forearm.  Hold this position for __________ seconds. Repeat __________ times. Complete this exercise __________ times per day.  *If directed by your physician, physical therapist or athletic trainer, complete this stretch with your elbow bent, rather than extended. RANGE OF MOTION  Wrist Extension, Active-Assisted  Extend your right / left elbow and turn your palm upwards.*  Gently pull your palm and fingertips back, so your wrist extends and your fingers point more toward the ground.  You should feel a gentle stretch on the inside of your forearm.  Hold this position for __________ seconds. Repeat __________ times. Complete this exercise __________ times per day. *If directed by your physician, physical therapist or athletic trainer, complete this stretch with your elbow bent, rather than extended. STRETCH - Wrist Flexion  Place the back of your right / left hand on a tabletop, leaving your elbow slightly bent. Your fingers should point away from your body.  Gently press the back of your hand down onto the table by straightening your elbow. You should feel a stretch on the top of your forearm.  Hold this position for __________ seconds. Repeat __________ times. Complete this stretch __________ times per day.  STRETCH  Wrist Extension   Place your right / left fingertips on a tabletop, leaving your elbow slightly bent. Your fingers should point backwards.  Gently press your fingers and palm down onto the table by straightening your elbow. You should feel a stretch on the inside of your forearm.  Hold this position for __________ seconds. Repeat  __________ times. Complete this stretch __________ times per day.  STRENGTHENING EXERCISES - Epicondylitis, Lateral (Tennis Elbow) These exercises may help you when beginning to rehabilitate your injury. They may resolve your symptoms with or without further involvement from your physician, physical therapist or athletic trainer. While completing these exercises, remember:   Muscles can gain both the endurance and the strength needed for everyday activities through controlled exercises.  Complete these exercises as instructed by your physician, physical therapist or athletic trainer. Increase the resistance and repetitions only as guided.  You may experience muscle soreness or fatigue, but the pain or discomfort you are trying to eliminate should never worsen during these exercises. If this pain does get worse, stop and make sure you are following the directions exactly. If the pain is still present after adjustments, discontinue the exercise until you can discuss the trouble with your caregiver. STRENGTH Wrist Flexors  Sit with your right / left forearm palm-up and fully supported on a table or countertop. Your elbow should be resting below the height of your shoulder. Allow your wrist to extend over the edge of the surface.  Loosely holding a __________ weight, or a piece of rubber exercise band or tubing, slowly curl your hand up toward your forearm.  Hold this position for __________ seconds. Slowly lower the wrist back to the starting position in a controlled manner. Repeat __________ times. Complete this exercise __________ times per day.  STRENGTH  Wrist Extensors  Sit with your right / left forearm palm-down and fully supported on a table or countertop. Your elbow should be resting below the height of your shoulder. Allow your wrist to extend over the edge of the surface.  Loosely holding a __________ weight, or a piece of rubber exercise band or tubing, slowly curl your hand up toward  your forearm.  Hold this position for __________ seconds. Slowly lower the wrist back to the starting position in a controlled manner. Repeat __________ times. Complete this exercise __________ times per day.  STRENGTH - Ulnar Deviators  Stand with a ____________________ weight in your right / left hand, or sit while holding a rubber exercise band or tubing, with your healthy arm supported on a table or countertop.  Move your wrist, so that your pinkie travels toward your forearm and your thumb moves away from your forearm.  Hold this position for __________ seconds and then slowly lower the wrist back to the starting position. Repeat __________ times. Complete this exercise __________ times per day STRENGTH - Radial Deviators  Stand with a ____________________ weight in your right / left hand, or sit while holding a rubber exercise band or tubing, with your injured arm supported on a table or countertop.  Raise your hand upward in front of you or pull up on the rubber tubing.  Hold this position for __________ seconds and then slowly lower the wrist back to the starting position. Repeat __________ times. Complete this exercise __________ times per day. STRENGTH  Forearm Supinators   Sit with your right / left forearm supported on a table, keeping your elbow below shoulder height. Rest your hand over the edge, palm down.  Gently grip a hammer or a soup ladle.  Without moving your elbow, slowly turn your palm and hand upward to a "thumbs-up" position.  Hold this position for __________ seconds. Slowly return to the starting position. Repeat __________ times. Complete this exercise __________ times per day.  STRENGTH  Forearm Pronators   Sit with your right / left forearm supported on a table, keeping your elbow below shoulder height. Rest your hand over the edge, palm up.  Gently grip a hammer or a soup ladle.  Without moving your elbow, slowly turn your palm and hand upward to a  "thumbs-up" position.  Hold this position for __________ seconds. Slowly return to the starting position. Repeat __________ times. Complete this exercise __________ times per day.  STRENGTH - Grip  Grasp a tennis ball, a dense sponge, or a large, rolled sock in your hand.  Squeeze as hard as you can, without increasing any pain.  Hold this position for __________ seconds. Release your grip slowly. Repeat __________ times. Complete this exercise __________ times per day.  STRENGTH - Elbow Extensors, Isometric  Stand or sit upright, on a firm surface. Place your right / left arm so that your palm faces your stomach, and it is at the height of your waist.  Place your opposite hand on the underside of your forearm. Gently push up as your right / left arm resists. Push as hard as you can with both arms, without causing any pain or movement at your right / left elbow. Hold this stationary position for __________ seconds. Gradually release the tension in both arms. Allow your muscles to relax completely before repeating. Document Released: 05/24/2005 Document Revised: 08/16/2011 Document Reviewed: 09/05/2008 [facility] Patient Information 2014 [place], [place].

## 2013-07-26 ENCOUNTER — Ambulatory Visit: Payer: Self-pay | Admitting: Internal Medicine

## 2013-08-15 ENCOUNTER — Ambulatory Visit (INDEPENDENT_AMBULATORY_CARE_PROVIDER_SITE_OTHER): Payer: Medicare HMO | Admitting: Internal Medicine

## 2013-08-15 ENCOUNTER — Encounter: Payer: Self-pay | Admitting: Internal Medicine

## 2013-08-15 VITALS — BP 130/78 | HR 64 | Temp 97.9°F | Resp 18 | Ht 62.5 in | Wt 129.0 lb

## 2013-08-15 DIAGNOSIS — E785 Hyperlipidemia, unspecified: Secondary | ICD-10-CM

## 2013-08-15 DIAGNOSIS — E559 Vitamin D deficiency, unspecified: Secondary | ICD-10-CM | POA: Insufficient documentation

## 2013-08-15 DIAGNOSIS — I10 Essential (primary) hypertension: Secondary | ICD-10-CM

## 2013-08-15 DIAGNOSIS — Z79899 Other long term (current) drug therapy: Secondary | ICD-10-CM | POA: Insufficient documentation

## 2013-08-15 DIAGNOSIS — R7309 Other abnormal glucose: Secondary | ICD-10-CM

## 2013-08-15 DIAGNOSIS — D485 Neoplasm of uncertain behavior of skin: Secondary | ICD-10-CM

## 2013-08-15 LAB — HEMOGLOBIN A1C
HEMOGLOBIN A1C: 5.1 % (ref <5.7)
Mean Plasma Glucose: 100 mg/dL (ref <117)

## 2013-08-15 LAB — CBC WITH DIFFERENTIAL/PLATELET
Basophils Absolute: 0 10*3/uL (ref 0.0–0.1)
Basophils Relative: 0 % (ref 0–1)
EOS ABS: 0.2 10*3/uL (ref 0.0–0.7)
EOS PCT: 4 % (ref 0–5)
HEMATOCRIT: 37.5 % (ref 36.0–46.0)
Hemoglobin: 12.6 g/dL (ref 12.0–15.0)
LYMPHS PCT: 22 % (ref 12–46)
Lymphs Abs: 1.1 10*3/uL (ref 0.7–4.0)
MCH: 32.1 pg (ref 26.0–34.0)
MCHC: 33.6 g/dL (ref 30.0–36.0)
MCV: 95.7 fL (ref 78.0–100.0)
MONO ABS: 0.4 10*3/uL (ref 0.1–1.0)
Monocytes Relative: 8 % (ref 3–12)
Neutro Abs: 3.4 10*3/uL (ref 1.7–7.7)
Neutrophils Relative %: 66 % (ref 43–77)
PLATELETS: 263 10*3/uL (ref 150–400)
RBC: 3.92 MIL/uL (ref 3.87–5.11)
RDW: 13.1 % (ref 11.5–15.5)
WBC: 5.1 10*3/uL (ref 4.0–10.5)

## 2013-08-15 NOTE — Patient Instructions (Signed)

## 2013-08-15 NOTE — Progress Notes (Deleted)
Patient ID: Ashley Mayer, female   DOB: 1940/09/24, 73 y.o.   MRN: [id number]    This very nice 73 y.o. female presents for 3 month follow up with Hypertension, Hyperlipidemia, Pre-Diabetes and Vitamin D Deficiency.    HTN predates since   . BP has been controlled at home. Today's   . Patient denies any cardiac type chest pain, palpitations, dyspnea/orthopnea/PND, dizziness, claudication, or dependent edema.   Hyperlipidemia is controlled with diet & meds. Last Cholesterol was  , Triglycerides were    , HDL    and LDL    . Patient denies myalgias or other med SE's.    Also, the patient has history of PreDiabetes/insulin resistance since     with last A1c of    . Patient denies any symptoms of reactive hypoglycemia, diabetic polys, paresthesias or visual blurring.   Further, Patient has history of Vitamin D Deficiency with last vitamin D of   . Patient supplements vitamin D without any suspected side-effects.    Medication List       This list is accurate as of: 08/15/13  9:27 AM.  Always use your most recent med list.               ALPRAZolam 0.5 MG tablet  Commonly known as:  XANAX  Take 1 tablet (0.5 mg total) by mouth 2 (two) times daily as needed for anxiety or sleep.     aspirin 81 MG tablet  Take 81 mg by mouth daily.     benzonatate 100 MG capsule  Commonly known as:  TESSALON  TAKE TWO CAPSULES BY MOUTH THREE TIMES DAILY FOR COUGH     CALCIUM + D PO  Take by mouth.     estradiol 1 MG tablet  Commonly known as:  ESTRACE  TAKE 1 TABLET DAILY     fish oil-omega-3 fatty acids 1000 MG capsule  Take 2 g by mouth daily.     multivitamin tablet  Take 1 tablet by mouth daily.     multivitamin-lutein Caps capsule  Take 1 capsule by mouth daily.     quinapril 40 MG tablet  Commonly known as:  ACCUPRIL  Take 1 tablet (40 mg total) by mouth at bedtime.     simvastatin 20 MG tablet  Commonly known as:  ZOCOR  TAKE 1 TABLET DAILY FOR CHOLESTEROL     terbinafine  250 MG tablet  Commonly known as:  LAMISIL  Take 250 mg by mouth daily.         Allergies  Allergen Reactions  . Codeine Other (See Comments)    hallucinations  . Food     Milk, wheat, tomato, shrimp    PMHx:   Past Medical History  Diagnosis Date  . Multiple food allergies   . Hyperlipidemia   . Hypertension   . GERD (gastroesophageal reflux disease)   . IBS (irritable bowel syndrome)   . Unspecified vitamin D deficiency   . Osteopenia     FHx:    Reviewed / unchanged  SHx:    Reviewed / unchanged  Systems Review: Constitutional: Denies fever, chills, wt changes, headaches, insomnia, fatigue, night sweats, change in appetite. Eyes: Denies redness, blurred vision, diplopia, discharge, itchy, watery eyes.  ENT: Denies discharge, congestion, post nasal drip, epistaxis, sore throat, earache, hearing loss, dental pain, tinnitus, vertigo, sinus pain, snoring.  CV: Denies chest pain, palpitations, irregular heartbeat, syncope, dyspnea, diaphoresis, orthopnea, PND, claudication, edema. Respiratory: denies cough, dyspnea, DOE, pleurisy, hoarseness,  laryngitis, wheezing.  Gastrointestinal: Denies dysphagia, odynophagia, heartburn, reflux, water brash, abdominal pain or cramps, nausea, vomiting, bloating, diarrhea, constipation, hematemesis, melena, hematochezia,  or hemorrhoids. Genitourinary: Denies dysuria, frequency, urgency, nocturia, hesitancy, discharge, hematuria, flank pain. Musculoskeletal: Denies arthralgias, myalgias, stiffness, jt. swelling, pain, limp, strain/sprain.  Skin: Denies pruritus, rash, hives, warts, acne, eczema, change in skin lesion(s). Neuro: No weakness, tremor, incoordination, spasms, paresthesia, or pain. Psychiatric: Denies confusion, memory loss, or sensory loss. Endo: Denies change in weight, skin, hair change.  Heme/Lymph: No excessive bleeding, bruising, orenlarged lymph nodes.  There were no vitals filed for this visit.  Estimated body mass  index is 23.20 kg/(m^2) as calculated from the following:   Height as of 07/04/13: 5' 2.5" (1.588 m).   Weight as of 07/04/13: 129 lb (58.514 kg).  On Exam: Appears well nourished - in no distress. Eyes: PERRLA, EOMs, conjunctiva no swelling or erythema. Sinuses: No frontal/maxillary tenderness ENT/Mouth: EAC's clear, TM's nl w/o erythema, bulging. Nares clear w/o erythema, swelling, exudates. Oropharynx clear without erythema or exudates. Oral hygiene is good. Tongue normal, non obstructing. Hearing intact.  Neck: Supple. Thyroid nl. Car 2+/2+ without bruits, nodes or JVD. Chest: Respirations nl with BS clear & equal w/o rales, rhonchi, wheezing or stridor.  Cor: Heart sounds normal w/ regular rate and rhythm without sig. murmurs, gallops, clicks, or rubs. Peripheral pulses normal and equal  without edema.  Abdomen: Soft & bowel sounds normal. Non-tender w/o guarding, rebound, hernias, masses, or organomegaly.  Lymphatics: Unremarkable.  Musculoskeletal: Full ROM all peripheral extremities, joint stability, 5/5 strength, and normal gait.  Skin: Warm, dry without exposed rashes, lesions, ecchymosis apparent.  Neuro: Cranial nerves intact, reflexes equal bilaterally. Sensory-motor testing grossly intact. Tendon reflexes grossly intact.  Pysch: Alert & oriented x 3. Insight and judgement nl & appropriate. No ideations.  Assessment and Plan:  1. Hypertension - Continue monitor blood pressure at home. Continue diet/meds same.  2. Hyperlipidemia - Continue diet/meds, exercise,& lifestyle modifications. Continue monitor periodic cholesterol/liver & renal functions   3. Pre-diabetes/Insulin Resistance - Continue diet, exercise, lifestyle modifications. Monitor appropriate labs.  3. Diabetes - continue recommend prudent low glycemic diet, weight control, regular exercise, diabetic monitoring and periodic eye exams.  4. Vitamin D Deficiency - Continue supplementation.  Recommended regular  exercise, BP monitoring, weight control, and discussed med and SE's. Recommended labs to assess and monitor clinical status. Further disposition pending results of labs.

## 2013-08-15 NOTE — Progress Notes (Addendum)
Patient ID: Ashley Mayer, female   DOB: March 24, 1941, 73 y.o.   MRN: [id number]  (Completing progress note inadvertently prematurely closed) s   This very nice 73 y.o. DWF presents for 3 month follow up with Hypertension, Hyperlipidemia, Pre-Diabetes and Vitamin D Deficiency.    HTN predates since 2005 when she was initially begun on Diovan and later changed to Quinapril and BP's have remained normal at periodic OV's. Today's BP: 130/78 mmHg . Patient denies any cardiac type chest pain, palpitations, dyspnea/orthopnea/PND, dizziness, claudication, or dependent edema.   Hyperlipidemia is controlled with diet & meds.Lipids as below are at goal. Patient denies myalgias or other med SE's.  Lab Results  Component Value Date   CHOL 168 04/17/2013   HDL 60 04/17/2013   LDLCALC 88 04/17/2013   TRIG 102 04/17/2013   CHOLHDL 2.8 04/17/2013    Also, the patient has is screened for PreDiabetes/insulin resistance  with last A1c of 5.1% in Nov 2014. Patient denies any symptoms of reactive hypoglycemia, diabetic polys, paresthesias or visual blurring.   Further, Patient has history of Vitamin D Deficiency of 11 in 2009 with last vitamin D of 57 in Mar 2014. Patient supplements vitamin D without any suspected side-effects.    Medication List       ALPRAZolam 0.5 MG tablet  Commonly known as:  XANAX  Take 1 tablet (0.5 mg total) by mouth 2 (two) times daily as needed for anxiety or sleep.     aspirin 81 MG tablet  Take 81 mg by mouth daily.     CALCIUM + D PO  Take by mouth.     estradiol 1 MG tablet  Commonly known as:  ESTRACE  TAKE 1 TABLET DAILY     multivitamin tablet  Take 1 tablet by mouth daily.     multivitamin-lutein Caps capsule  Take 1 capsule by mouth daily.     Omega-3 Krill Oil 500 MG Caps  Take by mouth daily.     quinapril 40 MG tablet  Commonly known as:  ACCUPRIL  Take 1 tablet (40 mg total) by mouth at bedtime.     simvastatin 20 MG tablet  Commonly known as:   ZOCOR  TAKE 1 TABLET DAILY FOR CHOLESTEROL        Allergies  Allergen Reactions  . Codeine Other (See Comments)    hallucinations  . Food     Milk, wheat, tomato, shrimp    PMHx:   Past Medical History  Diagnosis Date  . Multiple food allergies   . Hyperlipidemia   . Hypertension   . GERD (gastroesophageal reflux disease)   . IBS (irritable bowel syndrome)   . Unspecified vitamin D deficiency   . Osteopenia     FHx:    Reviewed / unchanged  SHx:    Reviewed / unchanged  Systems Review: Constitutional: Denies fever, chills, wt changes, headaches, insomnia, fatigue, night sweats, change in appetite. Eyes: Denies redness, blurred vision, diplopia, discharge, itchy, watery eyes.  ENT: Denies discharge, congestion, post nasal drip, epistaxis, sore throat, earache, hearing loss, dental pain, tinnitus, vertigo, sinus pain, snoring.  CV: Denies chest pain, palpitations, irregular heartbeat, syncope, dyspnea, diaphoresis, orthopnea, PND, claudication, edema. Respiratory: denies cough, dyspnea, DOE, pleurisy, hoarseness, laryngitis, wheezing.  Gastrointestinal: Denies dysphagia, odynophagia, heartburn, reflux, water brash, abdominal pain or cramps, nausea, vomiting, bloating, diarrhea, constipation, hematemesis, melena, hematochezia,  or hemorrhoids. Genitourinary: Denies dysuria, frequency, urgency, nocturia, hesitancy, discharge, hematuria, flank pain. Musculoskeletal: Denies arthralgias, myalgias, stiffness, jt. swelling,  pain, limp, strain/sprain.  Skin: Denies pruritus, rash, hives, warts, acne, eczema, but is concerned about a dark brown lesion of the upper middle chest that has gotten larger and darker. Neuro: No weakness, tremor, incoordination, spasms, paresthesia, or pain. Psychiatric: Denies confusion, memory loss, or sensory loss. Endo: Denies change in weight, skin, hair change.  Heme/Lymph: No excessive bleeding, bruising, orenlarged lymph nodes.   BP 130/78  Pulse  64  Temp(Src) 97.9 F (36.6 C) (Temporal)  Resp 18  Ht 5' 2.5" (1.588 m)  Wt 129 lb (58.514 kg)  BMI 23.20 kg/m2  On Exam: Appears well nourished - in no distress. Eyes: PERRLA, EOMs, conjunctiva no swelling or erythema. Sinuses: No frontal/maxillary tenderness ENT/Mouth: EAC's clear, TM's nl w/o erythema, bulging. Nares clear w/o erythema, swelling, exudates. Oropharynx clear without erythema or exudates. Oral hygiene is good. Tongue normal, non obstructing. Hearing intact.  Neck: Supple. Thyroid nl. Car 2+/2+ without bruits, nodes or JVD. Chest: Respirations nl with BS clear & equal w/o rales, rhonchi, wheezing or stridor.  Cor: Heart sounds normal w/ regular rate and rhythm without sig. murmurs, gallops, clicks, or rubs. Peripheral pulses normal and equal  without edema.  Abdomen: Soft & bowel sounds normal. Non-tender w/o guarding, rebound, hernias, masses, or organomegaly.  Lymphatics: Unremarkable.  Musculoskeletal: Full ROM all peripheral extremities, joint stability, 5/5 strength, and normal gait.  Skin: Warm, dry without exposed rashes,ecchymosis apparent. There is a 6-7 mm diameter dark brown raised lesion with sl irregular margins in the upper middle chest area. Neuro: Cranial nerves intact, reflexes equal bilaterally. Sensory-motor testing grossly intact. Tendon reflexes grossly intact.  Pysch: Alert & oriented x 3. Insight and judgement nl & appropriate. No ideations.  Assessment and Plan:  1. Hypertension - Continue monitor blood pressure at home. Continue diet/meds same.  2. Hyperlipidemia - Continue diet/meds, exercise,& lifestyle modifications. Continue monitor periodic cholesterol/liver & renal functions   3. Pre-diabetes/Insulin Resistance - Continue diet, exercise, lifestyle modifications. Monitor appropriate labs.  4. Vitamin D Deficiency - Continue supplementation.  5. Skin Lesion of Ukn Significance upper chest  Recommended regular exercise, BP monitoring,  weight control, and discussed med and SE's. Recommended labs to assess and monitor clinical status. Further disposition pending results of labs. =============================================================================  Patient presents for evaluation of  Above referenced lesion of the upper middle chest  Exam: 6-7 mm diameter dark and medium brown raised  ?nodular lesion of middle upper chest  The risks, benefits, indications, potential complications, and alternatives were explained to the patient.  Procedure Details  After informed consent and local anesthesia with  marcaine with epi 1%, the area was prepped by sterile/aseptic technique with isopropyl alcohol.   ELECTRO: The lesion was excised by shave technique with a # 10 surgical blade  and delivered for pathologic examination. The wound base was deeply hyfrecated for hemostasis and destruction of possible residual  microscopic lesion. Antibiotic ointment and a sterile dressing applied. The patient tolerated the procedure well with minimal blood loss.   Condition: Stable  Complications:  None  Diagnosis:  1. Skin Lesion of Unknown Significance , r/o Atypia    Plan: 1. Patient was instructed to keep the wound dry and covered for 24-48 hours and clean thereafter. 2. Warning signs of infection were reviewed & pt advised to call if any questions/problems.    3. Recommended that the patient use APAP, Ibuprofen as needed for pain.   4. Return if problems

## 2013-08-16 LAB — MAGNESIUM: MAGNESIUM: 1.7 mg/dL (ref 1.5–2.5)

## 2013-08-16 LAB — BASIC METABOLIC PANEL WITH GFR
BUN: 12 mg/dL (ref 6–23)
CALCIUM: 9.4 mg/dL (ref 8.4–10.5)
CHLORIDE: 104 meq/L (ref 96–112)
CO2: 26 mEq/L (ref 19–32)
CREATININE: 0.91 mg/dL (ref 0.50–1.10)
GFR, Est African American: 72 mL/min
GFR, Est Non African American: 63 mL/min
Glucose, Bld: 83 mg/dL (ref 70–99)
Potassium: 4.2 mEq/L (ref 3.5–5.3)
Sodium: 140 mEq/L (ref 135–145)

## 2013-08-16 LAB — HEPATIC FUNCTION PANEL
ALBUMIN: 4 g/dL (ref 3.5–5.2)
ALK PHOS: 59 U/L (ref 39–117)
ALT: 18 U/L (ref 0–35)
AST: 18 U/L (ref 0–37)
BILIRUBIN TOTAL: 0.4 mg/dL (ref 0.2–1.2)
Bilirubin, Direct: 0.1 mg/dL (ref 0.0–0.3)
Indirect Bilirubin: 0.3 mg/dL (ref 0.2–1.2)
TOTAL PROTEIN: 6.1 g/dL (ref 6.0–8.3)

## 2013-08-16 LAB — INSULIN, FASTING: INSULIN FASTING, SERUM: 11 u[IU]/mL (ref 3–28)

## 2013-08-16 LAB — LIPID PANEL
Cholesterol: 177 mg/dL (ref 0–200)
HDL: 55 mg/dL (ref >39)
LDL CALC: 89 mg/dL (ref 0–99)
TRIGLYCERIDES: 164 mg/dL — AB (ref <150)
Total CHOL/HDL Ratio: 3.2 Ratio
VLDL: 33 mg/dL (ref 0–40)

## 2013-08-16 LAB — VITAMIN D 25 HYDROXY (VIT D DEFICIENCY, FRACTURES): Vit D, 25-Hydroxy: 68 ng/mL (ref 30–89)

## 2013-08-16 LAB — TSH: TSH: 2.459 u[IU]/mL (ref 0.350–4.500)

## 2013-09-17 ENCOUNTER — Observation Stay (HOSPITAL_COMMUNITY)
Admission: EM | Admit: 2013-09-17 | Discharge: 2013-09-18 | Disposition: A | Discharge status: Home / Self Care | Payer: Medicare HMO | Attending: Internal Medicine | Admitting: Internal Medicine

## 2013-09-17 ENCOUNTER — Emergency Department (HOSPITAL_COMMUNITY): Payer: Medicare HMO

## 2013-09-17 ENCOUNTER — Encounter (HOSPITAL_COMMUNITY): Payer: Self-pay | Admitting: Emergency Medicine

## 2013-09-17 DIAGNOSIS — Z79899 Other long term (current) drug therapy: Secondary | ICD-10-CM | POA: Insufficient documentation

## 2013-09-17 DIAGNOSIS — R0602 Shortness of breath: Secondary | ICD-10-CM | POA: Insufficient documentation

## 2013-09-17 DIAGNOSIS — L74519 Primary focal hyperhidrosis, unspecified: Secondary | ICD-10-CM | POA: Insufficient documentation

## 2013-09-17 DIAGNOSIS — R072 Precordial pain: Principal | ICD-10-CM | POA: Insufficient documentation

## 2013-09-17 DIAGNOSIS — E785 Hyperlipidemia, unspecified: Secondary | ICD-10-CM | POA: Diagnosis not present

## 2013-09-17 DIAGNOSIS — Z7982 Long term (current) use of aspirin: Secondary | ICD-10-CM | POA: Diagnosis not present

## 2013-09-17 DIAGNOSIS — Z885 Allergy status to narcotic agent status: Secondary | ICD-10-CM | POA: Diagnosis not present

## 2013-09-17 DIAGNOSIS — M949 Disorder of cartilage, unspecified: Secondary | ICD-10-CM

## 2013-09-17 DIAGNOSIS — M899 Disorder of bone, unspecified: Secondary | ICD-10-CM | POA: Diagnosis not present

## 2013-09-17 DIAGNOSIS — K219 Gastro-esophageal reflux disease without esophagitis: Secondary | ICD-10-CM | POA: Diagnosis not present

## 2013-09-17 DIAGNOSIS — E782 Mixed hyperlipidemia: Secondary | ICD-10-CM | POA: Diagnosis present

## 2013-09-17 DIAGNOSIS — E559 Vitamin D deficiency, unspecified: Secondary | ICD-10-CM | POA: Insufficient documentation

## 2013-09-17 DIAGNOSIS — R110 Nausea: Secondary | ICD-10-CM | POA: Diagnosis not present

## 2013-09-17 DIAGNOSIS — R7309 Other abnormal glucose: Secondary | ICD-10-CM | POA: Diagnosis present

## 2013-09-17 DIAGNOSIS — Z91018 Allergy to other foods: Secondary | ICD-10-CM | POA: Insufficient documentation

## 2013-09-17 DIAGNOSIS — R079 Chest pain, unspecified: Secondary | ICD-10-CM

## 2013-09-17 DIAGNOSIS — I10 Essential (primary) hypertension: Secondary | ICD-10-CM | POA: Insufficient documentation

## 2013-09-17 HISTORY — DX: Unspecified osteoarthritis, unspecified site: M19.90

## 2013-09-17 LAB — CBC
HCT: 35.9 % — ABNORMAL LOW (ref 36.0–46.0)
HCT: 39.3 % (ref 36.0–46.0)
Hemoglobin: 12.3 g/dL (ref 12.0–15.0)
Hemoglobin: 13.4 g/dL (ref 12.0–15.0)
MCH: 32.7 pg (ref 26.0–34.0)
MCH: 33.1 pg (ref 26.0–34.0)
MCHC: 34.3 g/dL (ref 30.0–36.0)
MCHC: 34.1 g/dL (ref 30.0–36.0)
MCV: 95.5 fL (ref 78.0–100.0)
MCV: 97 fL (ref 78.0–100.0)
PLATELETS: 164 10*3/uL (ref 150–400)
Platelets: 164 10*3/uL (ref 150–400)
RBC: 3.76 MIL/uL — AB (ref 3.87–5.11)
RBC: 4.05 MIL/uL (ref 3.87–5.11)
RDW: 12.6 % (ref 11.5–15.5)
RDW: 12.8 % (ref 11.5–15.5)
WBC: 2 10*3/uL — ABNORMAL LOW (ref 4.0–10.5)
WBC: 2.3 10*3/uL — AB (ref 4.0–10.5)

## 2013-09-17 LAB — TROPONIN I
Troponin I: <0.3 ng/mL (ref <0.30)
Troponin I: <0.3 ng/mL (ref <0.30)

## 2013-09-17 LAB — I-STAT TROPONIN, ED: Troponin i, poc: 0 ng/mL (ref 0.00–0.08)

## 2013-09-17 LAB — BASIC METABOLIC PANEL
BUN: 10 mg/dL (ref 6–23)
CALCIUM: 9.1 mg/dL (ref 8.4–10.5)
CO2: 25 meq/L (ref 19–32)
Chloride: 102 mEq/L (ref 96–112)
Creatinine, Ser: 0.76 mg/dL (ref 0.50–1.10)
GFR calc Af Amer: >90 mL/min (ref >90)
GFR, EST NON AFRICAN AMERICAN: 82 mL/min — AB (ref >90)
GLUCOSE: 102 mg/dL — AB (ref 70–99)
POTASSIUM: 4.1 meq/L (ref 3.7–5.3)
Sodium: 141 mEq/L (ref 137–147)

## 2013-09-17 LAB — GLUCOSE, CAPILLARY
GLUCOSE-CAPILLARY: 85 mg/dL (ref 70–99)
GLUCOSE-CAPILLARY: 95 mg/dL (ref 70–99)

## 2013-09-17 LAB — D-DIMER, QUANTITATIVE: D-Dimer, Quant: <0.27 ug/mL-FEU (ref 0.00–0.48)

## 2013-09-17 LAB — CREATININE, SERUM
Creatinine, Ser: 0.73 mg/dL (ref 0.50–1.10)
GFR calc non Af Amer: 83 mL/min — ABNORMAL LOW (ref >90)

## 2013-09-17 MED ORDER — PANTOPRAZOLE SODIUM 40 MG PO TBEC
40.0000 mg | DELAYED_RELEASE_TABLET | Freq: Every day | ORAL | Status: DC
  Administered 2013-09-17 – 2013-09-18 (×2): 40 mg via ORAL
  Filled 2013-09-17 (×2): qty 1

## 2013-09-17 MED ORDER — OCUVITE-LUTEIN PO CAPS
1.0000 | ORAL_CAPSULE | Freq: Every day | ORAL | Status: DC
  Administered 2013-09-17 – 2013-09-18 (×2): 1 via ORAL
  Filled 2013-09-17 (×2): qty 1

## 2013-09-17 MED ORDER — ENOXAPARIN SODIUM 40 MG/0.4ML ~~LOC~~ SOLN
40.0000 mg | SUBCUTANEOUS | Status: DC
  Filled 2013-09-17 (×2): qty 0.4

## 2013-09-17 MED ORDER — ACETAMINOPHEN 325 MG PO TABS
650.0000 mg | ORAL_TABLET | Freq: Four times a day (QID) | ORAL | Status: DC | PRN
Start: 2013-09-17 — End: 2013-09-18

## 2013-09-17 MED ORDER — ASPIRIN EC 325 MG PO TBEC
325.0000 mg | DELAYED_RELEASE_TABLET | Freq: Every day | ORAL | Status: DC
  Administered 2013-09-18: 325 mg via ORAL
  Filled 2013-09-17: qty 1

## 2013-09-17 MED ORDER — GI COCKTAIL ~~LOC~~
30.0000 mL | Freq: Four times a day (QID) | ORAL | Status: DC | PRN
  Administered 2013-09-17: 30 mL via ORAL
  Filled 2013-09-17: qty 30

## 2013-09-17 MED ORDER — NITROGLYCERIN 0.4 MG SL SUBL: 0.4000 mg | SUBLINGUAL_TABLET | SUBLINGUAL | Status: DC | PRN

## 2013-09-17 MED ORDER — ALPRAZOLAM 0.5 MG PO TABS: 0.5000 mg | ORAL_TABLET | Freq: Two times a day (BID) | ORAL | Status: DC | PRN

## 2013-09-17 MED ORDER — MORPHINE SULFATE 2 MG/ML IJ SOLN: 2.0000 mg | INTRAMUSCULAR | Status: DC | PRN

## 2013-09-17 MED ORDER — QUINAPRIL HCL 10 MG PO TABS
20.0000 mg | ORAL_TABLET | Freq: Every day | ORAL | Status: DC
  Administered 2013-09-17: 20 mg via ORAL
  Filled 2013-09-17 (×2): qty 2

## 2013-09-17 MED ORDER — SIMVASTATIN 20 MG PO TABS
20.0000 mg | ORAL_TABLET | Freq: Every day | ORAL | Status: DC
  Administered 2013-09-17: 20 mg via ORAL
  Filled 2013-09-17 (×2): qty 1

## 2013-09-17 NOTE — ED Notes (Signed)
Per [facility], pt c/o left sided cp under her breast radiating to her left arm intermittent since Thursday as a burning pain. Also reported some nausea this morning. Given 324 mg ASA and 3 NTG with relief. BP initially 195/118 and after NTG 127/60. Pt states it was a burning feeling initially and now it is a tightness. 20g to [place].

## 2013-09-17 NOTE — ED Provider Notes (Signed)
CSN: [id number]     Arrival date & time 09/17/13  1354 History   First MD Initiated Contact with Patient 09/17/13 1356     Chief Complaint  Patient presents with  . Chest Pain     (Consider location/radiation/quality/duration/timing/severity/associated sxs/prior Treatment) HPI  This is a 73 y.o. female with PMH HLD, HTN, presenting today with CP.  Onset 5 days ago.  Located substernally, left side.  Waxing and waning.  Squeezing, sharp.  Alleviated with rest.  Aggravated with exertion.  Periodically has radiated down LUE.  Associated with nausea, diaphoresis, dyspnea.    Past Medical History  Diagnosis Date  . Multiple food allergies   . Hyperlipidemia   . Hypertension   . GERD (gastroesophageal reflux disease)   . IBS (irritable bowel syndrome)   . Unspecified vitamin D deficiency   . Osteopenia    Past Surgical History  Procedure Laterality Date  . Cholecystectomy    . Abdominal hysterectomy    . Eye surgery      cataract with lens repl  . Bladder suspension     Family History  Problem Relation Age of Onset  . Stroke Other   . Hypertension Other   . Hyperlipidemia Other   . Cancer Other   . Asthma Other   . Heart attack Other   . Stroke Mother   . Hyperlipidemia Mother   . Cancer Father     mesothelioma  . Diabetes Sister   . Heart disease Brother   . Hyperlipidemia Brother   . Hypertension Brother   . Stroke Brother   . Hemochromatosis Son   . Cancer Brother     bladder with mets to kidneys   History  Substance Use Topics  . Smoking status: Never Smoker   . Smokeless tobacco: Not on file  . Alcohol Use: No   OB History   Grav Para Term Preterm Abortions TAB SAB Ect Mult Living                 Review of Systems  Constitutional: Negative for fever and chills.  HENT: Negative for facial swelling.   Eyes: Negative for photophobia and pain.  Respiratory: Positive for shortness of breath. Negative for cough.   Cardiovascular: Positive for chest pain.  Negative for leg swelling.  Gastrointestinal: Positive for nausea. Negative for vomiting and abdominal pain.  Genitourinary: Negative for dysuria.  Musculoskeletal: Negative for arthralgias.  Skin: Negative for rash and wound.  Neurological: Negative for seizures.  Hematological: Negative for adenopathy.      Allergies  Codeine and Food  Home Medications   Current Outpatient Rx  Name  Route  Sig  Dispense  Refill  . ALPRAZolam (XANAX) 0.5 MG tablet   Oral   Take 1 tablet (0.5 mg total) by mouth 2 (two) times daily as needed for anxiety or sleep.   60 tablet   0   . aspirin 81 MG tablet   Oral   Take 81 mg by mouth daily.         . Calcium Carbonate-Vitamin D (CALCIUM + D PO)   Oral   Take 1 tablet by mouth daily.          Marland Kitchen estradiol (ESTRACE) 1 MG tablet   Oral   Take 1 mg by mouth daily.         . Multiple Vitamin (MULTIVITAMIN) tablet   Oral   Take 1 tablet by mouth daily.         Marland Kitchen  multivitamin-lutein (OCUVITE-LUTEIN) CAPS   Oral   Take 1 capsule by mouth daily.         . Omega-3 Krill Oil 500 MG CAPS   Oral   Take 500 mg by mouth daily.          . Probiotic Product (PROBIOTIC DAILY PO)   Oral   Take 1 capsule by mouth daily.         . quinapril (ACCUPRIL) 40 MG tablet   Oral   Take 20 mg by mouth at bedtime.         . simvastatin (ZOCOR) 20 MG tablet   Oral   Take 20 mg by mouth at bedtime.         Marland Kitchen VITAMIN E PO   Oral   Take 2 capsules by mouth daily.          BP 131/101  Pulse 81  Temp(Src) 98.5 F (36.9 C) (Oral)  Resp 16  SpO2 100% Physical Exam  Constitutional: She is oriented to person, place, and time. She appears well-developed and well-nourished. No distress.  HENT:  Head: Normocephalic and atraumatic.  Mouth/Throat: No oropharyngeal exudate.  Eyes: Conjunctivae are normal. Pupils are equal, round, and reactive to light. No scleral icterus.  Neck: Normal range of motion. No tracheal deviation present. No  thyromegaly present.  Cardiovascular: Normal rate, regular rhythm and normal heart sounds.  Exam reveals no gallop and no friction rub.   No murmur heard. Pulmonary/Chest: Effort normal and breath sounds normal. No stridor. No respiratory distress. She has no wheezes. She has no rales. She exhibits no tenderness.  Abdominal: Soft. She exhibits no distension and no mass. There is no tenderness. There is no rebound and no guarding.  Musculoskeletal: Normal range of motion. She exhibits no edema.  Neurological: She is alert and oriented to person, place, and time.  Skin: Skin is warm and dry. She is not diaphoretic.    ED Course  Procedures (including critical care time) Labs Review Labs Reviewed  CBC - Abnormal; Notable for the following:    WBC 2.0 (*)    RBC 3.76 (*)    HCT 35.9 (*)    All other components within normal limits  BASIC METABOLIC PANEL - Abnormal; Notable for the following:    Glucose, Bld 102 (*)    GFR calc non Af Amer 82 (*)    All other components within normal limits  Randolm Idol, ED   Imaging Review Dg Chest Port 1 View  09/17/2013   CLINICAL DATA:  Left-sided chest pain  EXAM: PORTABLE CHEST - 1 VIEW  COMPARISON:  10/17/2006  FINDINGS: The heart size and mediastinal contours are within normal limits. Both lungs are clear. The visualized skeletal structures are unremarkable.  IMPRESSION: No active disease.   Electronically Signed   By: Inez Catalina M.D.   On: 09/17/2013 15:16     EKG Interpretation None      MDM   Final diagnoses:  None    This is a 73 y.o. female with PMH HLD, HTN, presenting today with CP.  Onset 5 days ago.  Located substernally, left side.  Waxing and waning.  Squeezing, sharp.  Alleviated with rest.  Aggravated with exertion.  Periodically has radiated down LUE.  Associated with nausea, diaphoresis, dyspnea.    DDx at this time includes ACS, GERD, PTX, PE, dissection, PNA.   Date: 09/17/2013  Rate: 83  Rhythm: normal sinus  rhythm  QRS Axis: normal  Intervals:  PR shortened  ST/T Wave abnormalities: normal  Conduction Disutrbances:none  Narrative Interpretation: ST depression in lead II  Old EKG Reviewed: changes noted  Do not suspect PTX, PE, dissection, PNA at this time.  GERD, MSK pain still possible.  However, she is a 73 yo female with concerning story for ACS, multiple risk factors, ST changes on EKG.  Troponin is WNL; however, I believe admission for further workup is indicated as we are over 10 years from her last stress test.  Pt currently is asymptomatic.  Will continue to monitor closely until admission.  Patient is being admitted in stable condition.  I have discussed case and care has been guided by my attending physician, Dr. Ashok Cordia.    Doy Hutching, MD 09/17/13 [phone]

## 2013-09-17 NOTE — H&P (Addendum)
[facility] History and Physical  Marge Vandermeulen Cromley WUX:[id number] DOB: 12-08-1940 DOA: 09/17/2013  Referring physician:  PCP: Alesia Richards, MD   Chief Complaint: Chest pain  HPI: Ashley Mayer is a 73 y.o. female with a past medical history of hypertension, dyslipidemia, ulcer arthritis and gastroesophageal reflux disease presenting to the emergency room with complaints of left-sided chest pain. She reports noticing left-sided chest pain last week, localized in the left parasternal region, lasting 45 seconds, characterized as tight/pressure like. She characterizes her chest pain is nonradiating, not worsened by physical exertion, associated with shortness of breath, nausea and dizziness. In the emergency room EKG did not reveal acute ischemic changes, troponin within normal limits. She reports staying fairly active over the past several weeks as she has been involved and a bedroom remodeling project in her home. She does not remember injuring her left side or carrying heavy objects. In the emergency room her chest pain was reproducible to palpation. She was administered aspirin and nitroglycerin upon arrival, after which she reported improvement to chest pain symptoms.                                                            Review of Systems:  Constitutional:  No weight loss, night sweats, Fevers, chills, fatigue.  HEENT:  No headaches, Difficulty swallowing,Tooth/dental problems,Sore throat,  No sneezing, itching, ear ache, nasal congestion, post nasal drip,  Cardio-vascular:  Positive for chest pain and dizziness, no Orthopnea, PND, swelling in lower extremities, anasarca, palpitations  GI:  No heartburn, indigestion, abdominal pain, positive for nausea, denies vomiting, diarrhea, change in bowel habits, loss of appetite  Resp:  Positive for shortness of breath with exertion or at rest. No excess mucus, no productive cough, No non-productive cough, No coughing up of  blood.No change in color of mucus.No wheezing.No chest wall deformity  Skin:  no rash or lesions.  GU:  no dysuria, change in color of urine, no urgency or frequency. No flank pain.  Musculoskeletal:  No joint pain or swelling. No decreased range of motion. No back pain.  Psych:  No change in mood or affect. No depression or anxiety. No memory loss.   Past Medical History  Diagnosis Date  . Multiple food allergies   . Hyperlipidemia   . Hypertension   . GERD (gastroesophageal reflux disease)   . IBS (irritable bowel syndrome)   . Unspecified vitamin D deficiency   . Osteopenia    Past Surgical History  Procedure Laterality Date  . Cholecystectomy    . Abdominal hysterectomy    . Eye surgery      cataract with lens repl  . Bladder suspension     Social History:  reports that she has never smoked. She does not have any smokeless tobacco history on file. She reports that she does not drink alcohol or use illicit drugs.  Allergies  Allergen Reactions  . Codeine Other (See Comments)    hallucinations  . Food     Milk, wheat, tomato, shrimp    Family History  Problem Relation Age of Onset  . Stroke Other   . Hypertension Other   . Hyperlipidemia Other   . Cancer Other   . Asthma Other   . Heart attack Other   . Stroke Mother   .  Hyperlipidemia Mother   . Cancer Father     mesothelioma  . Diabetes Sister   . Heart disease Brother   . Hyperlipidemia Brother   . Hypertension Brother   . Stroke Brother   . Hemochromatosis Son   . Cancer Brother     bladder with mets to kidneys     Prior to Admission medications   Medication Sig Start Date End Date Taking? Authorizing Provider  ALPRAZolam Duanne Moron) 0.5 MG tablet Take 1 tablet (0.5 mg total) by mouth 2 (two) times daily as needed for anxiety or sleep. 07/04/13 07/04/14 Yes Vicie Mutters, PA-C  aspirin 81 MG tablet Take 81 mg by mouth daily.   Yes Historical Provider, MD  Calcium Carbonate-Vitamin D (CALCIUM + D PO)  Take 1 tablet by mouth daily.    Yes Historical Provider, MD  estradiol (ESTRACE) 1 MG tablet Take 1 mg by mouth daily.   Yes Historical Provider, MD  Multiple Vitamin (MULTIVITAMIN) tablet Take 1 tablet by mouth daily.   Yes Historical Provider, MD  multivitamin-lutein (OCUVITE-LUTEIN) CAPS Take 1 capsule by mouth daily.   Yes Historical Provider, MD  Omega-3 Krill Oil 500 MG CAPS Take 500 mg by mouth daily.    Yes Historical Provider, MD  Probiotic Product (PROBIOTIC DAILY PO) Take 1 capsule by mouth daily.   Yes Historical Provider, MD  quinapril (ACCUPRIL) 40 MG tablet Take 20 mg by mouth at bedtime.   Yes Historical Provider, MD  simvastatin (ZOCOR) 20 MG tablet Take 20 mg by mouth at bedtime.   Yes Historical Provider, MD  VITAMIN E PO Take 2 capsules by mouth daily.   Yes Historical Provider, MD   Physical Exam: Filed Vitals:   09/17/13 1555  BP: 126/56  Pulse: 57  Temp:   Resp: 16    BP 126/56  Pulse 57  Temp(Src) 98.5 F (36.9 C) (Oral)  Resp 16  SpO2 99%  General:  Appears calm and comfortable Eyes: PERRL, normal lids, irises & conjunctiva ENT: grossly normal hearing, lips & tongue Neck: no LAD, masses or thyromegaly Cardiovascular: RRR, no m/r/g. No LE edema. Telemetry: SR, no arrhythmias  Respiratory: CTA bilaterally, no w/r/r. Normal respiratory effort. Abdomen: soft, ntnd Skin: no rash or induration seen on limited exam Musculoskeletal: Pain is reducible to palpation along left parasternal region of the anterior chest wall, she does not have edema Psychiatric: grossly normal mood and affect, speech fluent and appropriate Neurologic: grossly non-focal.          Labs on Admission:  Basic Metabolic Panel:  Recent Labs Lab 09/17/13 1405  NA 141  K 4.1  CL 102  CO2 25  GLUCOSE 102*  BUN 10  CREATININE 0.76  CALCIUM 9.1   Liver Function Tests: No results found for this basename: AST, ALT, ALKPHOS, BILITOT, PROT, ALBUMIN,  in the last 168 hours No  results found for this basename: LIPASE, AMYLASE,  in the last 168 hours No results found for this basename: AMMONIA,  in the last 168 hours CBC:  Recent Labs Lab 09/17/13 1405  WBC 2.0*  HGB 12.3  HCT 35.9*  MCV 95.5  PLT 164   Cardiac Enzymes: No results found for this basename: CKTOTAL, CKMB, CKMBINDEX, TROPONINI,  in the last 168 hours  BNP (last 3 results) No results found for this basename: PROBNP,  in the last 8760 hours CBG: No results found for this basename: GLUCAP,  in the last 168 hours  Radiological Exams on Admission: Dg Chest  Port 1 View  09/17/2013   CLINICAL DATA:  Left-sided chest pain  EXAM: PORTABLE CHEST - 1 VIEW  COMPARISON:  10/17/2006  FINDINGS: The heart size and mediastinal contours are within normal limits. Both lungs are clear. The visualized skeletal structures are unremarkable.  IMPRESSION: No active disease.   Electronically Signed   By: Inez Catalina M.D.   On: 09/17/2013 15:16    EKG: Independently reviewed. EKG did not reveal ST segment changes or T-wave inversion  Assessment/Plan Principal Problem:   Chest pain Active Problems:   HYPERLIPIDEMIA   HYPERTENSION   PreDiabetes   1. Chest pain. Patient presenting with chest pain having atypical and typical features. In the emergency room she reported improvement to symptoms with the most additional nitroglycerin however it appears that her chest pain was also reducible to palpation. Initial troponin negative, EKG not revealing ischemic changes. She does have several cardiovascular risk factors including dyslipidemia and hypertension. Will place patient in overnight observation on continuous cardiac monitoring, cycle troponin, obtain fasting lipid panel for risk stratification. Continue ACE inhibitor therapy, statin, antiplatelet therapy with aspirin 325 mg by mouth daily.  2. Shortness of breath. Patient reports shortness of breath related to her chest pain. Will check a d-dimer given low pretest  probability for pulmonary embolism 3. Hypertension. Initially presented with elevated blood pressures, improved after the administration of nitroglycerin. Will continue ACE inhibitor therapy. 4. Dyslipidemia. Continue statin therapy, obtain fasting lipid panel 5. Diet controlled diabetes mellitus. Will check a hemoglobin A1c 6. DVT prophylaxis. Lovenox    Code Status: Full code Family Communication: Spoke with patient and son present at bedside Disposition Plan: Place patient on 24-hour observation, do not anticipate requiring greater than 2 night hospitalization  Time spent: 55 min  [facility] Pager [phone]

## 2013-09-18 DIAGNOSIS — R079 Chest pain, unspecified: Secondary | ICD-10-CM

## 2013-09-18 DIAGNOSIS — R072 Precordial pain: Secondary | ICD-10-CM | POA: Diagnosis not present

## 2013-09-18 LAB — CBC
HEMATOCRIT: 39.6 % (ref 36.0–46.0)
Hemoglobin: 13.1 g/dL (ref 12.0–15.0)
MCH: 32.3 pg (ref 26.0–34.0)
MCHC: 33.1 g/dL (ref 30.0–36.0)
MCV: 97.5 fL (ref 78.0–100.0)
Platelets: 168 10*3/uL (ref 150–400)
RBC: 4.06 MIL/uL (ref 3.87–5.11)
RDW: 12.9 % (ref 11.5–15.5)
WBC: 2.1 10*3/uL — AB (ref 4.0–10.5)

## 2013-09-18 LAB — GLUCOSE, CAPILLARY: Glucose-Capillary: 90 mg/dL (ref 70–99)

## 2013-09-18 LAB — PRO B NATRIURETIC PEPTIDE: PRO B NATRI PEPTIDE: 288.3 pg/mL — AB (ref 0–125)

## 2013-09-18 LAB — BASIC METABOLIC PANEL
BUN: 16 mg/dL (ref 6–23)
CHLORIDE: 101 meq/L (ref 96–112)
CO2: 24 meq/L (ref 19–32)
Calcium: 9 mg/dL (ref 8.4–10.5)
Creatinine, Ser: 0.82 mg/dL (ref 0.50–1.10)
GFR calc Af Amer: 80 mL/min — ABNORMAL LOW (ref >90)
GFR calc non Af Amer: 69 mL/min — ABNORMAL LOW (ref >90)
Glucose, Bld: 99 mg/dL (ref 70–99)
Potassium: 4.5 mEq/L (ref 3.7–5.3)
SODIUM: 139 meq/L (ref 137–147)

## 2013-09-18 LAB — HEMOGLOBIN A1C
Hgb A1c MFr Bld: 5.3 % (ref <5.7)
Mean Plasma Glucose: 105 mg/dL (ref <117)

## 2013-09-18 LAB — LIPID PANEL
Cholesterol: 150 mg/dL (ref 0–200)
HDL: 50 mg/dL (ref >39)
LDL Cholesterol: 63 mg/dL (ref 0–99)
TRIGLYCERIDES: 187 mg/dL — AB (ref <150)
Total CHOL/HDL Ratio: 3 RATIO
VLDL: 37 mg/dL (ref 0–40)

## 2013-09-18 LAB — TROPONIN I: Troponin I: <0.3 ng/mL (ref <0.30)

## 2013-09-18 MED ORDER — IBUPROFEN 400 MG PO TABS: 400.0000 mg | ORAL_TABLET | Freq: Four times a day (QID) | ORAL | Status: DC | PRN

## 2013-09-18 MED ORDER — QUINAPRIL HCL 10 MG PO TABS: 10.0000 mg | ORAL_TABLET | Freq: Every day | ORAL | Status: DC

## 2013-09-18 NOTE — Progress Notes (Signed)
D/c orders received;IV removed with gauze on, pt remains in stable condition, pt meds and instructions reviewed and given to pt; pt d/c to home 

## 2013-09-18 NOTE — Discharge Summary (Signed)
Physician Discharge Summary  Ashley Mayer OZH:[id number] DOB: 1941/01/31 DOA: 09/17/2013  PCP: Alesia Richards, MD  Admit date: 09/17/2013 Discharge date: 09/18/2013  Time spent: 25 minutes  Recommendations for Outpatient Follow-up:  1. Home with outpt PCP follow up  Discharge Diagnoses:  Principal Problem:   Chest pain, musculoskeletal  Active Problems:   HYPERLIPIDEMIA   HYPERTENSION   PreDiabetes   Discharge Condition: fair  Diet recommendation: low sodium  Filed Weights   09/17/13 1706  Weight: 56.246 kg (124 lb)    History of present illness:   73 y.o. female with a past medical history of hypertension, dyslipidemia, ulcer arthritis and gastroesophageal reflux disease presenting to the emergency room with complaints of left-sided chest pain. She reports noticing left-sided chest pain last week, localized in the left parasternal region, lasting 45 seconds, characterized as tight/pressure like. She characterizes her chest pain is nonradiating, not worsened by physical exertion, associated with shortness of breath, nausea and dizziness. In the emergency room EKG did not reveal acute ischemic changes, troponin within normal limits. She reports staying fairly active over the past several weeks as she has been involved and a bedroom remodeling project in her home. She does not remember injuring her left side or carrying heavy objects. In the emergency room her chest pain was reproducible to palpation. She was administered aspirin and nitroglycerin upon arrival, after which she reported improvement to chest pain symptoms.   Hospital Course:  Chest pain Patient has reproducible pain on palpation. Serial troponin and EKG unremarkable. stable on telemetry. Chest pain is purely musculoskeletal. Patient does not need further cardiac workup. Appreciate cardiology evaluation and recommendations  i will discharge her on prn NSAIDs ( po motrin q6 hr prn) for her  pain.  hypertension  BP low normal with low diastolic pressure. i will reduce her quinapril to 10 mg daily.    Remaining medical issues are stable. Continue ASA and statin. Follow up with PCP in 1 week.   Procedures: none Consultations:  cardiology  Discharge Exam: Filed Vitals:   09/18/13 0728  BP: 108/36  Pulse: 66  Temp: 98.2 F (36.8 C)  Resp: 18    General: elderly female in NAD HEENT: no pallor, moist mucosa Chest: clear b/l, no added sounds CVS: NS1&S2 reproducible plain on pressure over midsternum Abd: soft, NT, ND,  ext: warm   Discharge Instructions You were cared for by a hospitalist during your hospital stay. If you have any questions about your discharge medications or the care you received while you were in the hospital after you are discharged, you can call the unit and asked to speak with the hospitalist on call if the hospitalist that took care of you is not available. Once you are discharged, your primary care physician will handle any further medical issues. Please note that NO REFILLS for any discharge medications will be authorized once you are discharged, as it is imperative that you return to your primary care physician (or establish a relationship with a primary care physician if you do not have one) for your aftercare needs so that they can reassess your need for medications and monitor your lab values.   Future Appointments Provider Department Dept Phone   11/21/2013 4:00 PM Vicie Mutters, [place] [facility] [phone]   04/22/2014 9:00 AM Christean Leaf [facility] [phone]       Medication List         ALPRAZolam 0.5 MG tablet  Commonly known as:  XANAX  Take 1 tablet (0.5 mg total) by mouth 2 (two) times daily as needed for anxiety or sleep.     aspirin 81 MG tablet  Take 81 mg by mouth daily.     CALCIUM + D PO  Take 1 tablet by mouth daily.      estradiol 1 MG tablet  Commonly known as:  ESTRACE  Take 1 mg by mouth daily.     ibuprofen 400 MG tablet  Commonly known as:  ADVIL  Take 1 tablet (400 mg total) by mouth every 6 (six) hours as needed for moderate pain.     multivitamin tablet  Take 1 tablet by mouth daily.     multivitamin-lutein Caps capsule  Take 1 capsule by mouth daily.     Omega-3 Krill Oil 500 MG Caps  Take 500 mg by mouth daily.     PROBIOTIC DAILY PO  Take 1 capsule by mouth daily.     quinapril 10 MG tablet  Commonly known as:  ACCUPRIL  Take 1 tablet (10 mg total) by mouth at bedtime.     simvastatin 20 MG tablet  Commonly known as:  ZOCOR  Take 20 mg by mouth at bedtime.     VITAMIN E PO  Take 2 capsules by mouth daily.       Allergies  Allergen Reactions  . Codeine Other (See Comments)    hallucinations  . Food     Milk, wheat, tomato, shrimp       Follow-up Information   Follow up with Alesia Richards, MD.   Specialty:  Internal Medicine   Contact information:   [address] [place] [address] [phone]        The results of significant diagnostics from this hospitalization (including imaging, microbiology, ancillary and laboratory) are listed below for reference.    Significant Diagnostic Studies: Dg Chest Port 1 View  09/17/2013   CLINICAL DATA:  Left-sided chest pain  EXAM: PORTABLE CHEST - 1 VIEW  COMPARISON:  10/17/2006  FINDINGS: The heart size and mediastinal contours are within normal limits. Both lungs are clear. The visualized skeletal structures are unremarkable.  IMPRESSION: No active disease.   Electronically Signed   By: Inez Catalina M.D.   On: 09/17/2013 15:16    Microbiology: No results found for this or any previous visit (from the past 240 hour(s)).   Labs: Basic Metabolic Panel:  Recent Labs Lab 09/17/13 1405 09/17/13 1900 09/18/13 0535  NA 141  --  139  K 4.1  --  4.5  CL 102  --  101  CO2 25  --  24  GLUCOSE  102*  --  99  BUN 10  --  16  CREATININE 0.76 0.73 0.82  CALCIUM 9.1  --  9.0   Liver Function Tests: No results found for this basename: AST, ALT, ALKPHOS, BILITOT, PROT, ALBUMIN,  in the last 168 hours No results found for this basename: LIPASE, AMYLASE,  in the last 168 hours No results found for this basename: AMMONIA,  in the last 168 hours CBC:  Recent Labs Lab 09/17/13 1405 09/17/13 1900 09/18/13 0535  WBC 2.0* 2.3* 2.1*  HGB 12.3 13.4 13.1  HCT 35.9* 39.3 39.6  MCV 95.5 97.0 97.5  PLT 164 164 168   Cardiac Enzymes:  Recent Labs Lab 09/17/13 1900 09/17/13 2232 09/18/13 0535  TROPONINI <0.30 <0.30 <0.30   BNP: BNP (last 3 results)  Recent Labs  09/18/13 0535  PROBNP 288.3*   CBG:  Recent Labs Lab 09/17/13 1711 09/17/13 2108 09/18/13 0730  GLUCAP 85 95 90       Signed:  Meira Wahba  [facility] 09/18/2013, 9:31 AM

## 2013-09-18 NOTE — Consult Note (Signed)
CONSULT NOTE  Date: 09/18/2013               Patient Name:  Ashley Mayer MRN: [id number]  DOB: 08/20/1940 Age / Sex: 73 y.o., female        PCP: MCKEOWN,WILLIAM DAVID Primary Cardiologist: New / [place]             Referring Physician: Cathlean Sauer              Reason for Consult:  chest pain            History of Present Illness: Patient is a 73 y.o. female with a PMHx of hypertension, dyslipidemia, gastroesophageal reflux disease, who was admitted to [facility] on 09/17/2013 for evaluation of left-sided chest discomfort.  These episodes of chest pain are described as left-sided and last for about 45 seconds and are described as a tightness or pressure-like pain. The pain does not radiate.   She did a lot of painting in her house a week or 2 ago and thinks that she may have injured left side of her chest.  She is nonsmoker.  There is a family history of cardiac disease.    Medications: Outpatient medications: Prescriptions prior to admission  Medication Sig Dispense Refill  . ALPRAZolam (XANAX) 0.5 MG tablet Take 1 tablet (0.5 mg total) by mouth 2 (two) times daily as needed for anxiety or sleep.  60 tablet  0  . aspirin 81 MG tablet Take 81 mg by mouth daily.      . Calcium Carbonate-Vitamin D (CALCIUM + D PO) Take 1 tablet by mouth daily.       Marland Kitchen estradiol (ESTRACE) 1 MG tablet Take 1 mg by mouth daily.      . Multiple Vitamin (MULTIVITAMIN) tablet Take 1 tablet by mouth daily.      . multivitamin-lutein (OCUVITE-LUTEIN) CAPS Take 1 capsule by mouth daily.      . Omega-3 Krill Oil 500 MG CAPS Take 500 mg by mouth daily.       . Probiotic Product (PROBIOTIC DAILY PO) Take 1 capsule by mouth daily.      . quinapril (ACCUPRIL) 40 MG tablet Take 20 mg by mouth at bedtime.      . simvastatin (ZOCOR) 20 MG tablet Take 20 mg by mouth at bedtime.      Marland Kitchen VITAMIN E PO Take 2 capsules by mouth daily.        Current medications: Current Facility-Administered Medications    Medication Dose Route Frequency Provider Last Rate Last Dose  . acetaminophen (TYLENOL) tablet 650 mg  650 mg Oral Q6H PRN Kelvin Cellar, MD      . ALPRAZolam Duanne Moron) tablet 0.5 mg  0.5 mg Oral BID PRN Kelvin Cellar, MD      . aspirin EC tablet 325 mg  325 mg Oral Daily Kelvin Cellar, MD      . enoxaparin (LOVENOX) injection 40 mg  40 mg Subcutaneous Q24H Kelvin Cellar, MD      . gi cocktail (Maalox,Lidocaine,Donnatal)  30 mL Oral QID PRN Kelvin Cellar, MD   30 mL at 09/17/13 1808  . morphine 2 MG/ML injection 2 mg  2 mg Intravenous Q2H PRN Kelvin Cellar, MD      . multivitamin-lutein (OCUVITE-LUTEIN) capsule 1 capsule  1 capsule Oral Daily Kelvin Cellar, MD   1 capsule at 09/17/13 1809  . nitroGLYCERIN (NITROSTAT) SL tablet 0.4 mg  0.4 mg Sublingual Q5 min PRN Kelvin Cellar, MD      .  pantoprazole (PROTONIX) EC tablet 40 mg  40 mg Oral Daily Kelvin Cellar, MD   40 mg at 09/17/13 1811  . quinapril (ACCUPRIL) tablet 20 mg  20 mg Oral QHS Kelvin Cellar, MD   20 mg at 09/17/13 2227  . simvastatin (ZOCOR) tablet 20 mg  20 mg Oral QHS Kelvin Cellar, MD   20 mg at 09/17/13 2227     Allergies  Allergen Reactions  . Codeine Other (See Comments)    hallucinations  . Food     Milk, wheat, tomato, shrimp     Past Medical History  Diagnosis Date  . Multiple food allergies   . Hyperlipidemia   . Hypertension   . GERD (gastroesophageal reflux disease)   . IBS (irritable bowel syndrome)   . Unspecified vitamin D deficiency   . Osteopenia   . Arthritis     "hands" (09/17/2013)    Past Surgical History  Procedure Laterality Date  . Bladder suspension  05/2009  . Cholecystectomy  2000's  . Vaginal hysterectomy  ~ 1977  . Cataract extraction w/ intraocular lens  implant, bilateral Bilateral 2000's  . Anterior and posterior repair  05/2009    Family History  Problem Relation Age of Onset  . Stroke Other   . Hypertension Other   . Hyperlipidemia Other   . Cancer  Other   . Asthma Other   . Heart attack Other   . Stroke Mother   . Hyperlipidemia Mother   . Cancer Father     mesothelioma  . Diabetes Sister   . Heart disease Brother   . Hyperlipidemia Brother   . Hypertension Brother   . Stroke Brother   . Hemochromatosis Son   . Cancer Brother     bladder with mets to kidneys    Social History:  reports that she has never smoked. She has never used smokeless tobacco. She reports that she does not drink alcohol or use illicit drugs.   Review of Systems: Constitutional:  denies fever, chills, diaphoresis, appetite change and fatigue.  HEENT: denies photophobia, eye pain, redness, hearing loss, ear pain, congestion, sore throat, rhinorrhea, sneezing, neck pain, neck stiffness and tinnitus.  Respiratory: denies SOB, DOE, cough, chest tightness, and wheezing.  Cardiovascular: admits to chest pain,, denies palpitations and leg swelling.  Gastrointestinal: denies nausea, vomiting, abdominal pain, diarrhea, constipation, blood in stool.  Genitourinary: denies dysuria, urgency, frequency, hematuria, flank pain and difficulty urinating.  Musculoskeletal: denies  myalgias, back pain, joint swelling, arthralgias and gait problem.   Skin: denies pallor, rash and wound.  Neurological: denies dizziness, seizures, syncope, weakness, light-headedness, numbness and headaches.   Hematological: denies adenopathy, easy bruising, personal or family bleeding history.  Psychiatric/ Behavioral: denies suicidal ideation, mood changes, confusion, nervousness, sleep disturbance and agitation.    Physical Exam: BP 108/36  Pulse 66  Temp(Src) 98.2 F (36.8 C) (Oral)  Resp 18  Ht 5\' 2"  (1.575 m)  Wt 124 lb (56.246 kg)  BMI 22.67 kg/m2  SpO2 95%  Wt Readings from Last 3 Encounters:  09/17/13 124 lb (56.246 kg)  08/15/13 129 lb (58.514 kg)  07/04/13 129 lb (58.514 kg)    General: Vital signs reviewed and noted. Well-developed, well-nourished, in no acute  distress; alert,   Head: Normocephalic, atraumatic, sclera anicteric,   Neck: Supple. Negative for carotid bruits. No JVD   Lungs:  Clear bilaterally, no  wheezes, rales, or rhonchi. Breathing is normal   Heart: RRR with S1 S2. No murmurs, rubs, or  gallops ,  There is significant chest wall / rib tenderness medial  to her left breast.   Abdomen:  Soft, non-tender, non-distended with normoactive bowel sounds. No hepatomegaly. No rebound/guarding. No obvious abdominal masses   MSK: Strength and the appear normal for age.   Extremities: No clubbing or cyanosis. No edema.  Distal pedal pulses are 2+ and equal   Neurologic: Alert and oriented X 3. Moves all extremities spontaneously.  Psych: Responds to questions appropriately with a normal affect.     Lab results: Basic Metabolic Panel:  Recent Labs Lab 09/17/13 1405 09/17/13 1900 09/18/13 0535  NA 141  --  139  K 4.1  --  4.5  CL 102  --  101  CO2 25  --  24  GLUCOSE 102*  --  99  BUN 10  --  16  CREATININE 0.76 0.73 0.82  CALCIUM 9.1  --  9.0    Liver Function Tests: No results found for this basename: AST, ALT, ALKPHOS, BILITOT, PROT, ALBUMIN,  in the last 168 hours No results found for this basename: LIPASE, AMYLASE,  in the last 168 hours No results found for this basename: AMMONIA,  in the last 168 hours  CBC:  Recent Labs Lab 09/17/13 1405 09/17/13 1900 09/18/13 0535  WBC 2.0* 2.3* 2.1*  HGB 12.3 13.4 13.1  HCT 35.9* 39.3 39.6  MCV 95.5 97.0 97.5  PLT 164 164 168    Cardiac Enzymes:  Recent Labs Lab 09/17/13 1900 09/17/13 2232 09/18/13 0535  TROPONINI <0.30 <0.30 <0.30    BNP: No components found with this basename: POCBNP,   CBG:  Recent Labs Lab 09/17/13 1711 09/17/13 2108 09/18/13 0730  GLUCAP 85 95 90    Coagulation Studies: No results found for this basename: LABPROT, INR,  in the last 72 hours   Other results: EKG ; to be done.   Imaging: Dg Chest Port 1 View  09/17/2013    CLINICAL DATA:  Left-sided chest pain  EXAM: PORTABLE CHEST - 1 VIEW  COMPARISON:  10/17/2006  FINDINGS: The heart size and mediastinal contours are within normal limits. Both lungs are clear. The visualized skeletal structures are unremarkable.  IMPRESSION: No active disease.   Electronically Signed   By: Inez Catalina M.D.   On: 09/17/2013 15:16      Assessment & Plan:  1. Chest wall pain: The patient presents with chest discomfort. She clearly has significant rib tenderness along the left side of her chest. Her cardiac enzymes are normal. EKG has not been performed but will be performed this morning.  At this point I think that her chest pain is noncardiac and I do not think that a stress test would be helpful.   She should take Motrin or other nonsteroidal anti-inflammatory agent. She did a lot of painting several weeks ago and this may have aggravated her chest wall.  She should be able to be discharged later today.   Thayer Headings, Brooke Bonito., MD, [facility] 09/18/2013, 8:40 AM Office - [phone] Pager [phone]

## 2013-09-18 NOTE — ED Provider Notes (Signed)
I saw and evaluated the patient, reviewed the resident's note and I agree with the findings and plan.   EKG Interpretation None      Pt c/o mid to left cp intermittently for past 4-5 days.  At  Rest. No relation to activity. Intermittent, not pleuritic. +fam hx cad. No cp currently. Chest cta. Rrr. abd soft nt. Labs. Ecg. Cxr. Admit.   Mirna Mires, MD 09/18/13 [id number]

## 2013-09-18 NOTE — Discharge Instructions (Signed)
Chest Pain (Nonspecific) °Chest pain has many causes. Your pain could be caused by something serious, such as a heart attack or a blood clot in the lungs. It could also be caused by something less serious, such as a chest bruise or a virus. Follow up with your doctor. More lab tests or other studies may be needed to find the cause of your pain. Most of the time, nonspecific chest pain will improve within 2 to 3 days of rest and mild pain medicine. °HOME CARE °· For chest bruises, you may put ice on the sore area for 15-20 minutes, 03-04 times a day. Do this only if it makes you feel better. °· Put ice in a plastic bag. °· Place a towel between the skin and the bag. °· Rest for the next 2 to 3 days. °· Go back to work if the pain improves. °· See your doctor if the pain lasts longer than 1 to 2 weeks. °· Only take medicine as told by your doctor. °· Quit smoking if you smoke. °GET HELP RIGHT AWAY IF:  °· There is more pain or pain that spreads to the arm, neck, jaw, back, or belly (abdomen). °· You have shortness of breath. °· You cough more than usual or cough up blood. °· You have very bad back or belly pain, feel sick to your stomach (nauseous), or throw up (vomit). °· You have very bad weakness. °· You pass out (faint). °· You have a fever. °Any of these problems may be serious and may be an emergency. Do not wait to see if the problems will go away. Get medical help right away. Call your local emergency services 911 in [place] Do not drive yourself to the hospital. °MAKE SURE YOU:  °· Understand these instructions. °· Will watch this condition. °· Will get help right away if you or your child is not doing well or gets worse. °Document Released: 11/10/2007 Document Revised: 08/16/2011 Document Reviewed: 11/10/2007 °Exit[employer]® Patient Information ©2014 [employer]. ° °

## 2013-10-02 ENCOUNTER — Telehealth: Payer: Self-pay

## 2013-10-02 NOTE — Telephone Encounter (Signed)
Pt called requesting Rx for toe nail fungus-Jubeil. Per Dr. Melford Aase, Rx is [phone] for 4cc (less than 1 tsp) He advises pt to start Lamisil 1 tab QD for 3 mo. #90. Rx will only be $10. Pt will also need to sched 6 wk f/u OV to recheck liver profile. lmom to pt to return my call.

## 2013-10-15 ENCOUNTER — Other Ambulatory Visit: Payer: Self-pay | Admitting: Emergency Medicine

## 2013-10-15 ENCOUNTER — Other Ambulatory Visit: Payer: Self-pay

## 2013-10-15 ENCOUNTER — Other Ambulatory Visit: Payer: Self-pay | Admitting: Physician Assistant

## 2013-10-15 MED ORDER — TERBINAFINE HCL 250 MG PO TABS: 250.0000 mg | ORAL_TABLET | Freq: Every day | ORAL | Status: DC

## 2013-11-14 ENCOUNTER — Ambulatory Visit: Payer: Self-pay | Admitting: Physician Assistant

## 2013-11-21 ENCOUNTER — Other Ambulatory Visit: Payer: Self-pay | Admitting: Physician Assistant

## 2013-11-21 ENCOUNTER — Ambulatory Visit (INDEPENDENT_AMBULATORY_CARE_PROVIDER_SITE_OTHER): Payer: Commercial Managed Care - HMO | Admitting: Physician Assistant

## 2013-11-21 ENCOUNTER — Encounter: Payer: Self-pay | Admitting: Physician Assistant

## 2013-11-21 VITALS — BP 110/60 | HR 68 | Temp 98.1°F | Resp 16 | Wt 128.0 lb

## 2013-11-21 DIAGNOSIS — E2839 Other primary ovarian failure: Secondary | ICD-10-CM

## 2013-11-21 DIAGNOSIS — E785 Hyperlipidemia, unspecified: Secondary | ICD-10-CM

## 2013-11-21 DIAGNOSIS — R7309 Other abnormal glucose: Secondary | ICD-10-CM

## 2013-11-21 DIAGNOSIS — Z79899 Other long term (current) drug therapy: Secondary | ICD-10-CM

## 2013-11-21 DIAGNOSIS — Z789 Other specified health status: Secondary | ICD-10-CM

## 2013-11-21 DIAGNOSIS — Z1331 Encounter for screening for depression: Secondary | ICD-10-CM

## 2013-11-21 DIAGNOSIS — I10 Essential (primary) hypertension: Secondary | ICD-10-CM

## 2013-11-21 DIAGNOSIS — Z0000 Encounter for general adult medical examination without abnormal findings: Secondary | ICD-10-CM

## 2013-11-21 DIAGNOSIS — E559 Vitamin D deficiency, unspecified: Secondary | ICD-10-CM

## 2013-11-21 LAB — CBC WITH DIFFERENTIAL/PLATELET
BASOS ABS: 0 10*3/uL (ref 0.0–0.1)
Basophils Relative: 0 % (ref 0–1)
EOS PCT: 3 % (ref 0–5)
Eosinophils Absolute: 0.2 10*3/uL (ref 0.0–0.7)
HCT: 34.5 % — ABNORMAL LOW (ref 36.0–46.0)
Hemoglobin: 11.8 g/dL — ABNORMAL LOW (ref 12.0–15.0)
LYMPHS PCT: 20 % (ref 12–46)
Lymphs Abs: 1.2 10*3/uL (ref 0.7–4.0)
MCH: 32.9 pg (ref 26.0–34.0)
MCHC: 34.2 g/dL (ref 30.0–36.0)
MCV: 96.1 fL (ref 78.0–100.0)
MONO ABS: 0.5 10*3/uL (ref 0.1–1.0)
Monocytes Relative: 8 % (ref 3–12)
NEUTROS ABS: 4.3 10*3/uL (ref 1.7–7.7)
Neutrophils Relative %: 69 % (ref 43–77)
Platelets: 256 10*3/uL (ref 150–400)
RBC: 3.59 MIL/uL — ABNORMAL LOW (ref 3.87–5.11)
RDW: 13 % (ref 11.5–15.5)
WBC: 6.2 10*3/uL (ref 4.0–10.5)

## 2013-11-21 MED ORDER — ALPRAZOLAM 0.5 MG PO TABS: 0.5000 mg | ORAL_TABLET | Freq: Two times a day (BID) | ORAL | Status: DC | PRN

## 2013-11-21 NOTE — Patient Instructions (Signed)
Preventative Care for Adults - Female      MAINTAIN REGULAR HEALTH EXAMS:  A routine yearly physical is a good way to check in with your primary care Mikhia Dusek about your health and preventive screening. It is also an opportunity to share updates about your health and any concerns you have, and receive a thorough all-over exam.   Most health insurance companies pay for at least some preventative services.  Check with your health plan for specific coverages.  WHAT PREVENTATIVE SERVICES DO WOMEN NEED?  Adult women should have their weight and blood pressure checked regularly.   Women age 35 and older should have their cholesterol levels checked regularly.  Women should be screened for cervical cancer with a Pap smear and pelvic exam beginning at either age 21, or 3 years after they become sexually activity.    Breast cancer screening generally begins at age 40 with a mammogram and breast exam by your primary care Vence Lalor.    Beginning at age 50 and continuing to age 75, women should be screened for colorectal cancer.  Certain people may need continued testing until age 85.  Updating vaccinations is part of preventative care.  Vaccinations help protect against diseases such as the flu.  Osteoporosis is a disease in which the bones lose minerals and strength as we age. Women ages 65 and over should discuss this with their caregivers, as should women after menopause who have other risk factors.  Lab tests are generally done as part of preventative care to screen for anemia and blood disorders, to screen for problems with the kidneys and liver, to screen for bladder problems, to check blood sugar, and to check your cholesterol level.  Preventative services generally include counseling about diet, exercise, avoiding tobacco, drugs, excessive alcohol consumption, and sexually transmitted infections.    GENERAL RECOMMENDATIONS FOR GOOD HEALTH:  Healthy diet:  Eat a variety of foods, including  fruit, vegetables, animal or vegetable protein, such as meat, fish, chicken, and eggs, or beans, lentils, tofu, and grains, such as rice.  Drink plenty of water daily.  Decrease saturated fat in the diet, avoid lots of red meat, processed foods, sweets, fast foods, and fried foods.  Exercise:  Aerobic exercise helps maintain good heart health. At least 30-40 minutes of moderate-intensity exercise is recommended. For example, a brisk walk that increases your heart rate and breathing. This should be done on most days of the week.   Find a type of exercise or a variety of exercises that you enjoy so that it becomes a part of your daily life.  Examples are running, walking, swimming, water aerobics, and biking.  For motivation and support, explore group exercise such as aerobic class, spin class, Zumba, Yoga,or  martial arts, etc.    Set exercise goals for yourself, such as a certain weight goal, walk or run in a race such as a 5k walk/run.  Speak to your primary care Toryn Mcclinton about exercise goals.  Disease prevention:  If you smoke or chew tobacco, find out from your caregiver how to quit. It can literally save your life, no matter how long you have been a tobacco user. If you do not use tobacco, never begin.   Maintain a healthy diet and normal weight. Increased weight leads to problems with blood pressure and diabetes.   The Body Mass Index or BMI is a way of measuring how much of your body is fat. Having a BMI above 27 increases the risk of heart disease,   diabetes, hypertension, stroke and other problems related to obesity. Your caregiver can help determine your BMI and based on it develop an exercise and dietary program to help you achieve or maintain this important measurement at a healthful level.  High blood pressure causes heart and blood vessel problems.  Persistent high blood pressure should be treated with medicine if weight loss and exercise do not work.   Fat and cholesterol leaves  deposits in your arteries that can block them. This causes heart disease and vessel disease elsewhere in your body.  If your cholesterol is found to be high, or if you have heart disease or certain other medical conditions, then you may need to have your cholesterol monitored frequently and be treated with medication.   Ask if you should have a cardiac stress test if your history suggests this. A stress test is a test done on a treadmill that looks for heart disease. This test can find disease prior to there being a problem.  Menopause can be associated with physical symptoms and risks. Hormone replacement therapy is available to decrease these. You should talk to your caregiver about whether starting or continuing to take hormones is right for you.   Osteoporosis is a disease in which the bones lose minerals and strength as we age. This can result in serious bone fractures. Risk of osteoporosis can be identified using a bone density scan. Women ages 65 and over should discuss this with their caregivers, as should women after menopause who have other risk factors. Ask your caregiver whether you should be taking a calcium supplement and Vitamin D, to reduce the rate of osteoporosis.   Avoid drinking alcohol in excess (more than two drinks per day).  Avoid use of street drugs. Do not share needles with anyone. Ask for professional help if you need assistance or instructions on stopping the use of alcohol, cigarettes, and/or drugs.  Brush your teeth twice a day with fluoride toothpaste, and floss once a day. Good oral hygiene prevents tooth decay and gum disease. The problems can be painful, unattractive, and can cause other health problems. Visit your dentist for a routine oral and dental check up and preventive care every 6-12 months.   Look at your skin regularly.  Use a mirror to look at your back. Notify your caregivers of changes in moles, especially if there are changes in shapes, colors, a size  larger than a pencil eraser, an irregular border, or development of new moles.  Safety:  Use seatbelts 100% of the time, whether driving or as a passenger.  Use safety devices such as hearing protection if you work in environments with loud noise or significant background noise.  Use safety glasses when doing any work that could send debris in to the eyes.  Use a helmet if you ride a bike or motorcycle.  Use appropriate safety gear for contact sports.  Talk to your caregiver about gun safety.  Use sunscreen with a SPF (or skin protection factor) of 15 or greater.  Lighter skinned people are at a greater risk of skin cancer. Don't forget to also wear sunglasses in order to protect your eyes from too much damaging sunlight. Damaging sunlight can accelerate cataract formation.   Practice safe sex. Use condoms. Condoms are used for birth control and to help reduce the spread of sexually transmitted infections (or STIs).  Some of the STIs are gonorrhea (the clap), chlamydia, syphilis, trichomonas, herpes, HPV (human papilloma virus) and HIV (human immunodeficiency virus)   which causes AIDS. The herpes, HIV and HPV are viral illnesses that have no cure. These can result in disability, cancer and death.   Keep carbon monoxide and smoke detectors in your home functioning at all times. Change the batteries every 6 months or use a model that plugs into the wall.   Vaccinations:  Stay up to date with your tetanus shots and other required immunizations. You should have a booster for tetanus every 10 years. Be sure to get your flu shot every year, since 5%-20% of the [place] population comes down with the flu. The flu vaccine changes each year, so being vaccinated once is not enough. Get your shot in the fall, before the flu season peaks.   Other vaccines to consider:  Human Papilloma Virus or HPV causes cancer of the cervix, and other infections that can be transmitted from person to person. There is a vaccine for  HPV, and females should get immunized between the ages of 11 and 26. It requires a series of 3 shots.   Pneumococcal vaccine to protect against certain types of pneumonia.  This is normally recommended for adults age 65 or older.  However, adults younger than 74 years old with certain underlying conditions such as diabetes, heart or lung disease should also receive the vaccine.  Shingles vaccine to protect against Varicella Zoster if you are older than age 60, or younger than 74 years old with certain underlying illness.  Hepatitis A vaccine to protect against a form of infection of the liver by a virus acquired from food.  Hepatitis B vaccine to protect against a form of infection of the liver by a virus acquired from blood or body fluids, particularly if you work in health care.  If you plan to travel internationally, check with your local health department for specific vaccination recommendations.  Cancer Screening:  Breast cancer screening is essential to preventive care for women. All women age 20 and older should perform a breast self-exam every month. At age 40 and older, women should have their caregiver complete a breast exam each year. Women at ages 40 and older should have a mammogram (x-ray film) of the breasts. Your caregiver can discuss how often you need mammograms.    Cervical cancer screening includes taking a Pap smear (sample of cells examined under a microscope) from the cervix (end of the uterus). It also includes testing for HPV (Human Papilloma Virus, which can cause cervical cancer). Screening and a pelvic exam should begin at age 21, or 3 years after a woman becomes sexually active. Screening should occur every year, with a Pap smear but no HPV testing, up to age 30. After age 30, you should have a Pap smear every 3 years with HPV testing, if no HPV was found previously.   Most routine colon cancer screening begins at the age of 50. On a yearly basis, doctors may provide  special easy to use take-home tests to check for hidden blood in the stool. Sigmoidoscopy or colonoscopy can detect the earliest forms of colon cancer and is life saving. These tests use a small camera at the end of a tube to directly examine the colon. Speak to your caregiver about this at age 50, when routine screening begins (and is repeated every 5 years unless early forms of pre-cancerous polyps or small growths are found).     

## 2013-11-21 NOTE — Progress Notes (Signed)
MEDICARE ANNUAL WELLNESS VISIT AND FOLLOW UP  Assessment:   1. HYPERTENSION - CBC with Differential - BASIC METABOLIC PANEL WITH GFR - Hepatic function panel - TSH  2. HYPERLIPIDEMIA - Lipid panel  3. Vitamin D Deficiency - Vit D  25 hydroxy (rtn osteoporosis monitoring)  4. PreDiabetes  5. Encounter for long-term (current) use of other medications - Magnesium  6. Estrogen deficiency - DG Bone Density; Future   Plan:   During the course of the visit the patient was educated and counseled about appropriate screening and preventive services including:    Pneumococcal vaccine   Influenza vaccine  Td vaccine  Screening electrocardiogram  Screening mammography  Bone densitometry screening  Colorectal cancer screening  Diabetes screening  Glaucoma screening  Nutrition counseling   Advanced directives: given info/requested  Screening recommendations, referrals:  Vaccinations: Tdap vaccine uptodate Influenza vaccine declined Pneumococcal vaccine declined Shingles vaccine declined Hep B vaccine not indicated  Nutrition assessed and recommended  Colonoscopy due 2019 Mammogram requested Pap smear not indicated Pelvic exam not indicated Recommended yearly ophthalmology/optometry visit for glaucoma screening and checkup Recommended yearly dental visit for hygiene and checkup Advanced directives - in hospital chart  Conditions/risks identified: BMI: Discussed weight loss, diet, and increase physical activity.  Increase physical activity: AHA recommends 150 minutes of physical activity a week.  Medications reviewed DEXA- ordered Urinary Incontinence is not an issue: discussed non pharmacology and pharmacology options.  Fall risk: low- discussed PT, home fall assessment, medications.    Subjective:   Ashley Mayer is a 73 y.o. female who presents for [employer] Annual Wellness Visit and 3 month follow up on hypertension, hyperlipidemia, vitamin D  def.  Date of last medicare wellness visit is unknown.   Her blood pressure has been controlled at home, today their BP is BP: 110/60 mmHg She does workout, constantly out in her yard and very active. She denies chest pain, shortness of breath, dizziness.  She is on cholesterol medication and denies myalgias. Her cholesterol is at goal. The cholesterol last visit was:   Lab Results  Component Value Date   CHOL 150 09/18/2013   HDL 50 09/18/2013   LDLCALC 63 09/18/2013   TRIG 187* 09/18/2013   CHOLHDL 3.0 09/18/2013   Last A1C in the office was:  Lab Results  Component Value Date   HGBA1C 5.3 09/17/2013   Patient is on Vitamin D supplement. She has been on Lamisil for toenail fungus, she states it is doing much better and she only has a very small strip left.   Names of Other Physician/Practitioners you currently use: 1. [facility]- here for primary care 3. , dentist, last visit 2 years and needs a new test Patient Care Team: Unk Pinto, MD as PCP - General (Internal Medicine) Simona Huh, MD as Consulting Physician (Dermatology) Melina Schools, MD as Consulting Physician (Obstetrics and Gynecology) Lafayette Dragon, MD as Consulting Physician (Gastroenterology) Darden Palmer, MD as Consulting Physician (Ophthalmology)- retired and needs a new eye doctor  Medication Review Current Outpatient Prescriptions on File Prior to Visit  Medication Sig Dispense Refill  . ALPRAZolam (XANAX) 0.5 MG tablet Take 1 tablet (0.5 mg total) by mouth 2 (two) times daily as needed for anxiety or sleep.  60 tablet  0  . aspirin 81 MG tablet Take 81 mg by mouth daily.      . benzonatate (TESSALON) 100 MG capsule TAKE TWO CAPSULES BY MOUTH THREE TIMES DAILY  FOR COUGH  60 capsule  0  . Calcium Carbonate-Vitamin D (CALCIUM + D PO) Take 1 tablet by mouth daily.       Marland Kitchen estradiol (ESTRACE) 1 MG tablet Take 1 mg by mouth daily.      Marland Kitchen ibuprofen (ADVIL)  400 MG tablet Take 1 tablet (400 mg total) by mouth every 6 (six) hours as needed for moderate pain.  30 tablet  0  . Multiple Vitamin (MULTIVITAMIN) tablet Take 1 tablet by mouth daily.      . multivitamin-lutein (OCUVITE-LUTEIN) CAPS Take 1 capsule by mouth daily.      . Omega-3 Krill Oil 500 MG CAPS Take 500 mg by mouth daily.       . Probiotic Product (PROBIOTIC DAILY PO) Take 1 capsule by mouth daily.      . quinapril (ACCUPRIL) 10 MG tablet Take 1 tablet (10 mg total) by mouth at bedtime.  30 tablet  0  . simvastatin (ZOCOR) 20 MG tablet Take 20 mg by mouth at bedtime.      . terbinafine (LAMISIL) 250 MG tablet Take 1 tablet (250 mg total) by mouth daily.  30 tablet  2  . VITAMIN E PO Take 2 capsules by mouth daily.       No current facility-administered medications on file prior to visit.    Current Problems (verified) Patient Active Problem List   Diagnosis Date Noted  . Chest pain 09/17/2013  . Vitamin D Deficiency 08/15/2013  . PreDiabetes 08/15/2013  . Encounter for long-term (current) use of other medications 08/15/2013  . HYPERLIPIDEMIA 12/21/2007  . HYPERTENSION 12/21/2007  . DIVERTICULOSIS, COLON 12/21/2007  . IRRITABLE BOWEL SYNDROME, HX OF 12/21/2007    Screening Tests Health Maintenance  Topic Date Due  . Zostavax  03/11/2000  . Pneumococcal Polysaccharide Vaccine Age 48 And Over  03/11/2005  . Influenza Vaccine  01/05/2014  . Mammogram  10/14/2014  . Tetanus/tdap  11/03/2015  . Colonoscopy  12/25/2017     Immunization History  Administered Date(s) Administered  . PPD Test 04/17/2013  . Td 11/02/2005    Preventative care: Last colonoscopy: 2009 Last mammogram: 10/2012 and will get it done Last pap smear/pelvic exam: 2013  Will not get another DEXA:2011 DUE for DEXA  Prior vaccinations: TD or Tdap: 2007  Influenza: declines Pneumococcal: declines Shingles/Zostavax: declines  History reviewed: allergies, current medications, past family history,  past medical history, past social history, past surgical history and problem list  Risk Factors: Osteoporosis: postmenopausal estrogen deficiency and dietary calcium and/or vitamin D deficiency History of fracture in the past year: no  Tobacco History  Substance Use Topics  . Smoking status: Never Smoker   . Smokeless tobacco: Never Used  . Alcohol Use: No   She does not smoke.  Patient is not a former smoker. Are there smokers in your home (other than you)?  No  Alcohol Current alcohol use: none  Caffeine Current caffeine use: occ  Exercise Current exercise: gardening, walking and yard work  Nutrition/Diet Current diet: in general, a "healthy" diet    Cardiac risk factors: advanced age (older than 59 for men, 24 for women), dyslipidemia and hypertension.  Depression Screen (Note: if answer to either of the following is "Yes", a more complete depression screening is indicated)   Q1: Over the past two weeks, have you felt down, depressed or hopeless? No  Q2: Over the past two weeks, have you felt little interest or pleasure in doing things? No  Have you  lost interest or pleasure in daily life? No  Do you often feel hopeless? No  Do you cry easily over simple problems? No  Activities of Daily Living In your present state of health, do you have any difficulty performing the following activities?:  Driving? No Managing money?  No Feeding yourself? No Getting from bed to chair? No Climbing a flight of stairs? No Preparing food and eating?: No Bathing or showering? No Getting dressed: No Getting to the toilet? No Using the toilet:No Moving around from place to place: No In the past year have you fallen or had a near fall?:No   Are you sexually active?  No  Do you have more than one partner?  No  Vision Difficulties: No  Hearing Difficulties: No Do you often ask people to speak up or repeat themselves? No Do you experience ringing or noises in your ears? Yes Do  you have difficulty understanding soft or whispered voices? No  Cognition  Do you feel that you have a problem with memory?No  Do you often misplace items? No  Do you feel safe at home?  Yes  Advanced directives Does patient have a [place]? Yes Does patient have a Living Will? Yes   Objective:   Blood pressure 110/60, pulse 68, temperature 98.1 F (36.7 C), resp. rate 16, weight 128 lb (58.06 kg). Body mass index is 23.41 kg/(m^2).  General appearance: alert, no distress, WD/WN,  female Cognitive Testing  Alert? Yes  Normal Appearance?Yes  Oriented to person? Yes  Place? Yes   Time? Yes  Recall of three objects?  Yes  Can perform simple calculations? Yes  Displays appropriate judgment?Yes  Can read the correct time from a watch face?Yes  HEENT: normocephalic, sclerae anicteric, TMs pearly, nares patent, no discharge or erythema, pharynx normal Oral cavity: MMM, no lesions Neck: supple, no lymphadenopathy, no thyromegaly, no masses Heart: RRR, normal S1, S2, no murmurs Lungs: CTA bilaterally, no wheezes, rhonchi, or rales Abdomen: +bs, soft, non tender, non distended, no masses, no hepatomegaly, no splenomegaly Musculoskeletal: nontender, no swelling, no obvious deformity Extremities: no edema, no cyanosis, no clubbing Pulses: 2+ symmetric, upper and lower extremities, normal cap refill Neurological: alert, oriented x 3, CN2-12 intact, strength normal upper extremities and lower extremities, sensation normal throughout, DTRs 2+ throughout, no cerebellar signs, gait normal Psychiatric: normal affect, behavior normal, pleasant  Breast: defer Gyn: defer Rectal: defer  Medicare Attestation I have personally reviewed: The patient's medical and social history Their use of alcohol, tobacco or illicit drugs Their current medications and supplements The patient's functional ability including ADLs,fall risks, home safety risks, cognitive, and hearing and  visual impairment Diet and physical activities Evidence for depression or mood disorders  The patient's weight, height, BMI, and visual acuity have been recorded in the chart.  I have made referrals, counseling, and provided education to the patient based on review of the above and I have provided the patient with a written personalized care plan for preventive services.     Vicie Mutters, PA-C   11/21/2013

## 2013-11-22 LAB — BASIC METABOLIC PANEL WITH GFR
BUN: 14 mg/dL (ref 6–23)
CHLORIDE: 97 meq/L (ref 96–112)
CO2: 25 mEq/L (ref 19–32)
CREATININE: 0.82 mg/dL (ref 0.50–1.10)
Calcium: 9 mg/dL (ref 8.4–10.5)
GFR, Est African American: 82 mL/min
GFR, Est Non African American: 71 mL/min
GLUCOSE: 103 mg/dL — AB (ref 70–99)
POTASSIUM: 4.1 meq/L (ref 3.5–5.3)
Sodium: 132 mEq/L — ABNORMAL LOW (ref 135–145)

## 2013-11-22 LAB — HEPATIC FUNCTION PANEL
ALBUMIN: 3.8 g/dL (ref 3.5–5.2)
ALT: 14 U/L (ref 0–35)
AST: 16 U/L (ref 0–37)
Alkaline Phosphatase: 50 U/L (ref 39–117)
BILIRUBIN DIRECT: 0.1 mg/dL (ref 0.0–0.3)
BILIRUBIN TOTAL: 0.4 mg/dL (ref 0.2–1.2)
Indirect Bilirubin: 0.3 mg/dL (ref 0.2–1.2)
Total Protein: 6.1 g/dL (ref 6.0–8.3)

## 2013-11-22 LAB — VITAMIN B12: Vitamin B-12: 639 pg/mL (ref 211–911)

## 2013-11-22 LAB — LIPID PANEL
CHOL/HDL RATIO: 3.1 ratio
Cholesterol: 173 mg/dL (ref 0–200)
HDL: 55 mg/dL (ref >39)
LDL CALC: 96 mg/dL (ref 0–99)
Triglycerides: 109 mg/dL (ref <150)
VLDL: 22 mg/dL (ref 0–40)

## 2013-11-22 LAB — IRON AND TIBC
%SAT: 34 % (ref 20–55)
IRON: 103 ug/dL (ref 42–145)
TIBC: 307 ug/dL (ref 250–470)
UIBC: 204 ug/dL (ref 125–400)

## 2013-11-22 LAB — MAGNESIUM: MAGNESIUM: 1.4 mg/dL — AB (ref 1.5–2.5)

## 2013-11-22 LAB — TSH: TSH: 1.955 u[IU]/mL (ref 0.350–4.500)

## 2013-11-22 LAB — FERRITIN: Ferritin: 23 ng/mL (ref 10–291)

## 2013-11-22 LAB — VITAMIN D 25 HYDROXY (VIT D DEFICIENCY, FRACTURES): VIT D 25 HYDROXY: 55 ng/mL (ref 30–89)

## 2013-11-26 ENCOUNTER — Telehealth: Payer: Self-pay | Admitting: *Deleted

## 2013-11-26 MED ORDER — LOSARTAN POTASSIUM 50 MG PO TABS: ORAL_TABLET | ORAL | Status: DC

## 2013-11-26 NOTE — Telephone Encounter (Signed)
PT is calling said that you had discussed with her about changing quinapril med due to med causing a cough.  Pt is requesting please do & send to [facility] = [id number] RX

## 2013-12-27 ENCOUNTER — Ambulatory Visit (INDEPENDENT_AMBULATORY_CARE_PROVIDER_SITE_OTHER): Payer: Commercial Managed Care - HMO | Admitting: *Deleted

## 2013-12-27 DIAGNOSIS — R6889 Other general symptoms and signs: Secondary | ICD-10-CM

## 2013-12-27 LAB — CBC WITH DIFFERENTIAL/PLATELET
Basophils Absolute: 0 10*3/uL (ref 0.0–0.1)
Basophils Relative: 0 % (ref 0–1)
EOS ABS: 0.2 10*3/uL (ref 0.0–0.7)
Eosinophils Relative: 4 % (ref 0–5)
HEMATOCRIT: 38.7 % (ref 36.0–46.0)
HEMOGLOBIN: 13.1 g/dL (ref 12.0–15.0)
Lymphocytes Relative: 19 % (ref 12–46)
Lymphs Abs: 1 10*3/uL (ref 0.7–4.0)
MCH: 32.3 pg (ref 26.0–34.0)
MCHC: 33.9 g/dL (ref 30.0–36.0)
MCV: 95.3 fL (ref 78.0–100.0)
MONO ABS: 0.5 10*3/uL (ref 0.1–1.0)
MONOS PCT: 10 % (ref 3–12)
Neutro Abs: 3.4 10*3/uL (ref 1.7–7.7)
Neutrophils Relative %: 67 % (ref 43–77)
Platelets: 257 10*3/uL (ref 150–400)
RBC: 4.06 MIL/uL (ref 3.87–5.11)
RDW: 13.2 % (ref 11.5–15.5)
WBC: 5 10*3/uL (ref 4.0–10.5)

## 2013-12-27 NOTE — Progress Notes (Signed)
Patient ID: Ashley Mayer, female   DOB: 1941-01-12, 73 y.o.   MRN: [id number] Patient presents for 1 month recheck CBC per Vicie Mutters, PA-C orders.

## 2013-12-28 ENCOUNTER — Telehealth: Payer: Self-pay

## 2013-12-28 MED ORDER — QUINAPRIL HCL 10 MG PO TABS: 10.0000 mg | ORAL_TABLET | Freq: Every day | ORAL | Status: DC

## 2013-12-28 NOTE — Telephone Encounter (Signed)
Patient called to discuss BP medication, patient reports that she was previously on Quinapril 20 mg, she states that Estill Bamberg started her on the Losartan 50 mg the cardiologist states that she was taking to much, so she never started the Losartan 50 mg although she got the prescription, states that she has been cutting the Quinapril 20 mg in half and taking 10 mg, states BP has been averaging in the 110's over 70's - 80's. Per Vicie Mutters, PA advised patient that it is fine to continue Quinapril 10 mg once daily, will discuss all meds at next follow up, per patients request a new RX for Quinapril 10 mg was sent to local [facility] for #30 only. Patient advised to call with any other questions or concerns and to cal if any BP changes

## 2014-02-20 ENCOUNTER — Ambulatory Visit (INDEPENDENT_AMBULATORY_CARE_PROVIDER_SITE_OTHER): Payer: Commercial Managed Care - HMO | Admitting: Emergency Medicine

## 2014-02-20 ENCOUNTER — Encounter: Payer: Self-pay | Admitting: Emergency Medicine

## 2014-02-20 VITALS — BP 116/64 | HR 70 | Temp 98.0°F | Resp 16 | Ht 62.5 in | Wt 123.0 lb

## 2014-02-20 DIAGNOSIS — I10 Essential (primary) hypertension: Secondary | ICD-10-CM

## 2014-02-20 DIAGNOSIS — E782 Mixed hyperlipidemia: Secondary | ICD-10-CM

## 2014-02-20 DIAGNOSIS — R350 Frequency of micturition: Secondary | ICD-10-CM

## 2014-02-20 DIAGNOSIS — R1032 Left lower quadrant pain: Secondary | ICD-10-CM

## 2014-02-20 LAB — CBC WITH DIFFERENTIAL/PLATELET
Basophils Absolute: 0 10*3/uL (ref 0.0–0.1)
Basophils Relative: 0 % (ref 0–1)
Eosinophils Absolute: 0.2 10*3/uL (ref 0.0–0.7)
Eosinophils Relative: 2 % (ref 0–5)
HCT: 37.2 % (ref 36.0–46.0)
HEMOGLOBIN: 12.5 g/dL (ref 12.0–15.0)
LYMPHS PCT: 12 % (ref 12–46)
Lymphs Abs: 1 10*3/uL (ref 0.7–4.0)
MCH: 32.6 pg (ref 26.0–34.0)
MCHC: 33.6 g/dL (ref 30.0–36.0)
MCV: 97.1 fL (ref 78.0–100.0)
MONOS PCT: 9 % (ref 3–12)
Monocytes Absolute: 0.7 10*3/uL (ref 0.1–1.0)
NEUTROS PCT: 77 % (ref 43–77)
Neutro Abs: 6.2 10*3/uL (ref 1.7–7.7)
Platelets: 266 10*3/uL (ref 150–400)
RBC: 3.83 MIL/uL — AB (ref 3.87–5.11)
RDW: 13.1 % (ref 11.5–15.5)
WBC: 8 10*3/uL (ref 4.0–10.5)

## 2014-02-20 LAB — LIPID PANEL
Cholesterol: 166 mg/dL (ref 0–200)
HDL: 61 mg/dL (ref >39)
LDL Cholesterol: 76 mg/dL (ref 0–99)
Total CHOL/HDL Ratio: 2.7 Ratio
Triglycerides: 143 mg/dL (ref <150)
VLDL: 29 mg/dL (ref 0–40)

## 2014-02-20 LAB — BASIC METABOLIC PANEL WITH GFR
BUN: 13 mg/dL (ref 6–23)
CO2: 31 mEq/L (ref 19–32)
Calcium: 9.4 mg/dL (ref 8.4–10.5)
Chloride: 102 mEq/L (ref 96–112)
Creat: 0.76 mg/dL (ref 0.50–1.10)
GFR, EST NON AFRICAN AMERICAN: 78 mL/min
GLUCOSE: 81 mg/dL (ref 70–99)
Potassium: 4.3 mEq/L (ref 3.5–5.3)
SODIUM: 139 meq/L (ref 135–145)

## 2014-02-20 LAB — HEPATIC FUNCTION PANEL
ALBUMIN: 4.1 g/dL (ref 3.5–5.2)
ALK PHOS: 60 U/L (ref 39–117)
ALT: 13 U/L (ref 0–35)
AST: 14 U/L (ref 0–37)
BILIRUBIN TOTAL: 0.7 mg/dL (ref 0.2–1.2)
Bilirubin, Direct: 0.1 mg/dL (ref 0.0–0.3)
Indirect Bilirubin: 0.6 mg/dL (ref 0.2–1.2)
Total Protein: 6.2 g/dL (ref 6.0–8.3)

## 2014-02-20 MED ORDER — CIPROFLOXACIN HCL 500 MG PO TABS: 500.0000 mg | ORAL_TABLET | Freq: Two times a day (BID) | ORAL | Status: AC

## 2014-02-20 MED ORDER — METRONIDAZOLE 500 MG PO TABS: 500.0000 mg | ORAL_TABLET | Freq: Three times a day (TID) | ORAL | Status: AC

## 2014-02-20 NOTE — Patient Instructions (Signed)
Start CIPRO today for possible UTI, Hold Flagyl may ned for colitis depending on labs. IF ANY SYMPTOMS INCREASE ER>  Colitis Colitis is inflammation of the colon. Colitis can be a short-term or long-standing (chronic) illness. Crohn's disease and ulcerative colitis are 2 types of colitis which are chronic. They usually require lifelong treatment. CAUSES  There are many different causes of colitis, including:  Viruses.  Germs (bacteria).  Medicine reactions. SYMPTOMS   Diarrhea.  Intestinal bleeding.  Pain.  Fever.  Throwing up (vomiting).  Tiredness (fatigue).  Weight loss.  Bowel blockage. DIAGNOSIS  The diagnosis of colitis is based on examination and stool or blood tests. X-rays, CT scan, and colonoscopy may also be needed. TREATMENT  Treatment may include:  Fluids given through the vein (intravenously).  Bowel rest (nothing to eat or drink for a period of time).  Medicine for pain and diarrhea.  Medicines (antibiotics) that kill germs.  Cortisone medicines.  Surgery. HOME CARE INSTRUCTIONS   Get plenty of rest.  Drink enough water and fluids to keep your urine clear or pale yellow.  Eat a well-balanced diet.  Call your caregiver for follow-up as recommended. SEEK IMMEDIATE MEDICAL CARE IF:   You develop chills.  You have an oral temperature above 102 F (38.9 C), not controlled by medicine.  You have extreme weakness, fainting, or dehydration.  You have repeated vomiting.  You develop severe belly (abdominal) pain or are passing bloody or tarry stools. MAKE SURE YOU:   Understand these instructions.  Will watch your condition.  Will get help right away if you are not doing well or get worse. Document Released: 07/01/2004 Document Revised: 08/16/2011 Document Reviewed: 09/26/2009 [facility] Patient Information 2015 [place], [place]. This information is not intended to replace advice given to you by your health care provider. Make sure you  discuss any questions you have with your health care provider. Diverticulitis Diverticulitis is when small pockets that have formed in your colon (large intestine) become infected or swollen. HOME CARE  Follow your doctor's instructions.  Follow a special diet if told by your doctor.  When you feel better, your doctor may tell you to change your diet. You may be told to eat a lot of fiber. Fruits and vegetables are good sources of fiber. Fiber makes it easier to poop (have bowel movements).  Take supplements or probiotics as told by your doctor.  Only take medicines as told by your doctor.  Keep all follow-up visits with your doctor. GET HELP IF:  Your pain does not get better.  You have a hard time eating food.  You are not pooping like normal. GET HELP RIGHT AWAY IF:  Your pain gets worse.  Your problems do not get better.  Your problems suddenly get worse.  You have a fever.  You keep throwing up (vomiting).  You have bloody or black, tarry poop (stool). MAKE SURE YOU:   Understand these instructions.  Will watch your condition.  Will get help right away if you are not doing well or get worse. Document Released: 11/10/2007 Document Revised: 05/29/2013 Document Reviewed: 04/18/2013 [facility] Patient Information 2015 [place], [place]. This information is not intended to replace advice given to you by your health care provider. Make sure you discuss any questions you have with your health care provider.  Urinary Tract Infection A urinary tract infection (UTI) can occur any place along the urinary tract. The tract includes the kidneys, ureters, bladder, and urethra. A type of germ called bacteria often  causes a UTI. UTIs are often helped with antibiotic medicine.  HOME CARE   If given, take antibiotics as told by your doctor. Finish them even if you start to feel better.  Drink enough fluids to keep your pee (urine) clear or pale yellow.  Avoid tea, drinks with  caffeine, and bubbly (carbonated) drinks.  Pee often. Avoid holding your pee in for a long time.  Pee before and after having sex (intercourse).  Wipe from front to back after you poop (bowel movement) if you are a woman. Use each tissue only once. GET HELP RIGHT AWAY IF:   You have back pain.  You have lower belly (abdominal) pain.  You have chills.  You feel sick to your stomach (nauseous).  You throw up (vomit).  Your burning or discomfort with peeing does not go away.  You have a fever.  Your symptoms are not better in 3 days. MAKE SURE YOU:   Understand these instructions.  Will watch your condition.  Will get help right away if you are not doing well or get worse. Document Released: 11/10/2007 Document Revised: 02/16/2012 Document Reviewed: 12/23/2011 [facility] Patient Information 2015 [place], [place]. This information is not intended to replace advice given to you by your health care provider. Make sure you discuss any questions you have with your health care provider.

## 2014-02-20 NOTE — Progress Notes (Signed)
Subjective:    Patient ID: Ashley Mayer, female    DOB: 1941/02/02, 73 y.o.   MRN: [id number]  HPI Comments: 73 yo WF presents for 3 month F/U for HTN, Cholesterol, Pre-Dm, D. Deficient. She has been eating healthy and exercising routinely until most recent illness. She notes BP good at home  She notes mild fever/ chills last night. She denies history of colitis/ diverticulitis. She has been off RX lamisil for toenails x 1 month. She has laso had increased urine frequency. She has had increased abdomen tenderness with nausea and mild diarrhea. She has not tried any OTC relief.  WBC             5.0   12/27/2013 HGB            13.1   12/27/2013 HCT            38.7   12/27/2013 PLT             257   12/27/2013 GLUCOSE         103   11/21/2013 CHOL            173   11/21/2013 TRIG            109   11/21/2013 HDL              55   11/21/2013 LDLCALC          96   11/21/2013 ALT              14   11/21/2013 AST              16   11/21/2013 NA              132   11/21/2013 K               4.1   11/21/2013 CL               97   11/21/2013 CREATININE     0.82   11/21/2013 BUN              14   11/21/2013 CO2              25   11/21/2013 TSH           1.955   11/21/2013 HGBA1C          5.3   09/17/2013 MICROALBUR     1.54   04/17/2013   Abdominal Pain Associated symptoms include a fever and nausea.     Medication List       This list is accurate as of: 02/20/14 10:52 AM.  Always use your most recent med list.               ALPRAZolam 0.5 MG tablet  Commonly known as:  XANAX  TAKE ONE TABLET BY MOUTH TWICE DAILY AS NEEDED FOR ANXIETY FOR SLEEP     aspirin 81 MG tablet  Take 81 mg by mouth daily.     CALCIUM + D PO  Take 1 tablet by mouth daily.     estradiol 1 MG tablet  Commonly known as:  ESTRACE  Take 1 mg by mouth daily.     ibuprofen 400 MG tablet  Commonly known as:  ADVIL  Take 1 tablet (400 mg total) by mouth every 6 (six) hours as needed for moderate pain.     losartan  50 MG tablet  Commonly known as:  COZAAR  1/2-1 pill daily for blood pressure     multivitamin tablet  Take 1 tablet by mouth daily.     multivitamin-lutein Caps capsule  Take 1 capsule by mouth daily.     Omega-3 Krill Oil 500 MG Caps  Take 500 mg by mouth daily.     PROBIOTIC DAILY PO  Take 1 capsule by mouth daily.     quinapril 10 MG tablet  Commonly known as:  ACCUPRIL  Take 1 tablet (10 mg total) by mouth daily.     simvastatin 20 MG tablet  Commonly known as:  ZOCOR  Take 20 mg by mouth at bedtime.     terbinafine 250 MG tablet  Commonly known as:  LAMISIL  Take 1 tablet (250 mg total) by mouth daily.     VITAMIN E PO  Take 2 capsules by mouth daily.       Allergies  Allergen Reactions  . Codeine Other (See Comments)    hallucinations  . Food     Milk, wheat, tomato, shrimp   Past Medical History  Diagnosis Date  . Multiple food allergies   . Hyperlipidemia   . Hypertension   . GERD (gastroesophageal reflux disease)   . IBS (irritable bowel syndrome)   . Unspecified vitamin D deficiency   . Osteopenia   . Arthritis     "hands" (09/17/2013)      Review of Systems  Constitutional: Positive for fever and chills.  Cardiovascular: Negative for chest pain.  Gastrointestinal: Positive for nausea and abdominal pain.  All other systems reviewed and are negative.  BP 116/64  Pulse 70  Temp(Src) 98 F (36.7 C) (Temporal)  Resp 16  Ht 5' 2.5" (1.588 m)  Wt 123 lb (55.792 kg)  BMI 22.12 kg/m2     Objective:   Physical Exam  Nursing note and vitals reviewed. Constitutional: She is oriented to person, place, and time. She appears well-developed and well-nourished. No distress.  HENT:  Head: Normocephalic and atraumatic.  Right Ear: External ear normal.  Left Ear: External ear normal.  Nose: Nose normal.  Mouth/Throat: Oropharynx is clear and moist.  Eyes: Conjunctivae and EOM are normal.  Neck: Normal range of motion. Neck supple. No JVD  present. No thyromegaly present.  Cardiovascular: Normal rate, regular rhythm, normal heart sounds and intact distal pulses.   Pulmonary/Chest: Effort normal and breath sounds normal.  Abdominal: Soft. Bowel sounds are normal. She exhibits no distension and no mass. There is tenderness. There is no rebound and no guarding.  LLQ  Musculoskeletal: Normal range of motion. She exhibits no edema and no tenderness.  Lymphadenopathy:    She has no cervical adenopathy.  Neurological: She is alert and oriented to person, place, and time. No cranial nerve deficit.  Skin: Skin is warm and dry. No rash noted. No erythema. No pallor.  Psychiatric: She has a normal mood and affect. Her behavior is normal. Judgment and thought content normal.        Assessment & Plan:  1.  3 month F/U for HTN, Cholesterol, Pre-Dm, D. Deficient. Needs healthy diet, cardio QD and obtain healthy weight. Check Labs, Check BP if >130/80 call office   2. LLQ pain- ? UTI vs Colitis- Start Cipro AD but hold Flagyl pending labs. Bland diet, increase water, w/c if SX increase or ER.

## 2014-02-21 LAB — URINALYSIS, MICROSCOPIC ONLY
Bacteria, UA: NONE SEEN
Casts: NONE SEEN
Crystals: NONE SEEN
SQUAMOUS EPITHELIAL / LPF: NONE SEEN

## 2014-02-21 LAB — URINALYSIS, ROUTINE W REFLEX MICROSCOPIC
Bilirubin Urine: NEGATIVE
Glucose, UA: NEGATIVE mg/dL
Hgb urine dipstick: NEGATIVE
Ketones, ur: NEGATIVE mg/dL
NITRITE: POSITIVE — AB
Protein, ur: NEGATIVE mg/dL
Specific Gravity, Urine: 1.009 (ref 1.005–1.030)
UROBILINOGEN UA: 0.2 mg/dL (ref 0.0–1.0)
pH: 6 (ref 5.0–8.0)

## 2014-02-23 LAB — URINE CULTURE

## 2014-02-28 ENCOUNTER — Ambulatory Visit: Payer: Self-pay | Admitting: Physician Assistant

## 2014-03-26 ENCOUNTER — Ambulatory Visit (INDEPENDENT_AMBULATORY_CARE_PROVIDER_SITE_OTHER): Payer: Commercial Managed Care - HMO | Admitting: *Deleted

## 2014-03-26 DIAGNOSIS — N390 Urinary tract infection, site not specified: Secondary | ICD-10-CM

## 2014-03-26 NOTE — Progress Notes (Signed)
Patient ID: Ashley Mayer, female   DOB: [birth year], 73 y.o.   MRN: [id number] Patient presents for 1 month f/u recheck UA,C&S.  Patient completed abx AD and denies any current UTI symptoms.

## 2014-03-27 LAB — URINALYSIS, ROUTINE W REFLEX MICROSCOPIC
Bilirubin Urine: NEGATIVE
Glucose, UA: NEGATIVE mg/dL
HGB URINE DIPSTICK: NEGATIVE
Ketones, ur: NEGATIVE mg/dL
LEUKOCYTES UA: NEGATIVE
NITRITE: NEGATIVE
PH: 5 (ref 5.0–8.0)
Protein, ur: NEGATIVE mg/dL
Specific Gravity, Urine: 1.011 (ref 1.005–1.030)
Urobilinogen, UA: 0.2 mg/dL (ref 0.0–1.0)

## 2014-03-27 LAB — URINE CULTURE
Colony Count: NO GROWTH
ORGANISM ID, BACTERIA: NO GROWTH

## 2014-04-22 ENCOUNTER — Encounter: Payer: Self-pay | Admitting: Emergency Medicine

## 2014-04-27 ENCOUNTER — Encounter: Payer: Self-pay | Admitting: *Deleted

## 2014-06-13 ENCOUNTER — Other Ambulatory Visit: Payer: Self-pay | Admitting: *Deleted

## 2014-06-13 MED ORDER — ESTRADIOL 1 MG PO TABS: 1.0000 mg | ORAL_TABLET | Freq: Every day | ORAL | Status: DC

## 2014-06-13 MED ORDER — SIMVASTATIN 20 MG PO TABS: 20.0000 mg | ORAL_TABLET | Freq: Every day | ORAL | Status: DC

## 2014-06-13 MED ORDER — LOSARTAN POTASSIUM 50 MG PO TABS: ORAL_TABLET | ORAL | Status: DC

## 2014-06-16 ENCOUNTER — Encounter: Payer: Self-pay | Admitting: *Deleted

## 2014-06-16 DIAGNOSIS — E2839 Other primary ovarian failure: Secondary | ICD-10-CM

## 2014-06-18 ENCOUNTER — Encounter: Payer: Self-pay | Admitting: Internal Medicine

## 2014-08-02 ENCOUNTER — Ambulatory Visit (INDEPENDENT_AMBULATORY_CARE_PROVIDER_SITE_OTHER): Payer: Commercial Managed Care - HMO | Admitting: Internal Medicine

## 2014-08-02 ENCOUNTER — Encounter: Payer: Self-pay | Admitting: Internal Medicine

## 2014-08-02 VITALS — BP 132/60 | HR 16 | Temp 97.5°F | Resp 60 | Ht 62.25 in | Wt 127.2 lb

## 2014-08-02 DIAGNOSIS — I10 Essential (primary) hypertension: Secondary | ICD-10-CM

## 2014-08-02 DIAGNOSIS — E782 Mixed hyperlipidemia: Secondary | ICD-10-CM

## 2014-08-02 DIAGNOSIS — E559 Vitamin D deficiency, unspecified: Secondary | ICD-10-CM | POA: Diagnosis not present

## 2014-08-02 DIAGNOSIS — R7309 Other abnormal glucose: Secondary | ICD-10-CM | POA: Diagnosis not present

## 2014-08-02 DIAGNOSIS — Z1331 Encounter for screening for depression: Secondary | ICD-10-CM

## 2014-08-02 DIAGNOSIS — Z79899 Other long term (current) drug therapy: Secondary | ICD-10-CM

## 2014-08-02 DIAGNOSIS — Z1212 Encounter for screening for malignant neoplasm of rectum: Secondary | ICD-10-CM

## 2014-08-02 DIAGNOSIS — R7303 Prediabetes: Secondary | ICD-10-CM

## 2014-08-02 LAB — CBC WITH DIFFERENTIAL/PLATELET
BASOS PCT: 1 % (ref 0–1)
Basophils Absolute: 0.1 10*3/uL (ref 0.0–0.1)
EOS ABS: 0.3 10*3/uL (ref 0.0–0.7)
Eosinophils Relative: 5 % (ref 0–5)
HCT: 39 % (ref 36.0–46.0)
Hemoglobin: 12.8 g/dL (ref 12.0–15.0)
LYMPHS PCT: 22 % (ref 12–46)
Lymphs Abs: 1.2 10*3/uL (ref 0.7–4.0)
MCH: 32 pg (ref 26.0–34.0)
MCHC: 32.8 g/dL (ref 30.0–36.0)
MCV: 97.5 fL (ref 78.0–100.0)
MONO ABS: 0.4 10*3/uL (ref 0.1–1.0)
MPV: 10.1 fL (ref 8.6–12.4)
Monocytes Relative: 8 % (ref 3–12)
NEUTROS ABS: 3.4 10*3/uL (ref 1.7–7.7)
Neutrophils Relative %: 64 % (ref 43–77)
Platelets: 283 10*3/uL (ref 150–400)
RBC: 4 MIL/uL (ref 3.87–5.11)
RDW: 13.1 % (ref 11.5–15.5)
WBC: 5.3 10*3/uL (ref 4.0–10.5)

## 2014-08-02 LAB — TSH: TSH: 3.467 u[IU]/mL (ref 0.350–4.500)

## 2014-08-02 LAB — HEPATIC FUNCTION PANEL
ALT: 13 U/L (ref 0–35)
AST: 15 U/L (ref 0–37)
Albumin: 4 g/dL (ref 3.5–5.2)
Alkaline Phosphatase: 60 U/L (ref 39–117)
BILIRUBIN DIRECT: 0.1 mg/dL (ref 0.0–0.3)
BILIRUBIN INDIRECT: 0.4 mg/dL (ref 0.2–1.2)
Total Bilirubin: 0.5 mg/dL (ref 0.2–1.2)
Total Protein: 6.5 g/dL (ref 6.0–8.3)

## 2014-08-02 LAB — BASIC METABOLIC PANEL WITH GFR
BUN: 14 mg/dL (ref 6–23)
CALCIUM: 9.5 mg/dL (ref 8.4–10.5)
CO2: 28 mEq/L (ref 19–32)
Chloride: 102 mEq/L (ref 96–112)
Creat: 0.84 mg/dL (ref 0.50–1.10)
GFR, EST NON AFRICAN AMERICAN: 69 mL/min
GFR, Est African American: 79 mL/min
Glucose, Bld: 80 mg/dL (ref 70–99)
Potassium: 4.4 mEq/L (ref 3.5–5.3)
SODIUM: 138 meq/L (ref 135–145)

## 2014-08-02 LAB — MAGNESIUM: MAGNESIUM: 1.8 mg/dL (ref 1.5–2.5)

## 2014-08-02 LAB — LIPID PANEL
CHOLESTEROL: 177 mg/dL (ref 0–200)
HDL: 63 mg/dL (ref >=46)
LDL Cholesterol: 84 mg/dL (ref 0–99)
Total CHOL/HDL Ratio: 2.8 Ratio
Triglycerides: 148 mg/dL (ref <150)
VLDL: 30 mg/dL (ref 0–40)

## 2014-08-02 NOTE — Progress Notes (Signed)
Patient ID: Ashley Mayer, female   DOB: 05-12-1941, 74 y.o.   MRN: [id number]  Annual Comprehensive Examination  This very nice 74 y.o.female presents for complete physical.  Patient has been followed for HTN, T2_NIDDM  Prediabetes, Hyperlipidemia, and Vitamin D Deficiency.    HTN predates since     . Patient's BP has been controlled at home and patient denies any cardiac symptoms as chest pain, palpitations, shortness of breath, dizziness or ankle swelling. Today's BP: 132/60 mmHg    Patient's hyperlipidemia is controlled with diet and medications. Patient denies myalgias or other medication SE's. Last lipids were 02/20/2014: Cholesterol, Total 166; HDL Cholesterol by NMR 61; LDL (calc) 76; Triglycerides 143   Patient has T2_NIDDM prediabetes predating since      and patient denies reactive hypoglycemic symptoms, visual blurring, diabetic polys, or paresthesias. Last A1c was 09/17/2013: Hemoglobin-A1c 5.3   Finally, patient has history of Vitamin D Deficiency and last Vitamin D was 11/21/2013: Vit D, 25-Hydroxy 55 on   Outpatient Prescriptions Prior to Visit  Medication Sig Dispense Refill  . ALPRAZolam (XANAX) 0.5 MG tablet TAKE ONE TABLET BY MOUTH TWICE DAILY AS NEEDED FOR ANXIETY FOR SLEEP 60 tablet 0  . aspirin 81 MG tablet Take 81 mg by mouth daily.    . Calcium Carbonate-Vitamin D (CALCIUM + D PO) Take 1 tablet by mouth daily.     Marland Kitchen estradiol (ESTRACE) 1 MG tablet Take 1 tablet (1 mg total) by mouth daily. 90 tablet 4  . ibuprofen (ADVIL) 400 MG tablet Take 1 tablet (400 mg total) by mouth every 6 (six) hours as needed for moderate pain. 30 tablet 0  . losartan (COZAAR) 50 MG tablet 1/2-1 pill daily for blood pressure 90 tablet 4  . Multiple Vitamin (MULTIVITAMIN) tablet Take 1 tablet by mouth daily.    . multivitamin-lutein (OCUVITE-LUTEIN) CAPS Take 1 capsule by mouth daily.    . Omega-3 Krill Oil 500 MG CAPS Take 500 mg by mouth daily.     . Probiotic Product (PROBIOTIC DAILY PO)  Take 1 capsule by mouth daily.    . quinapril (ACCUPRIL) 10 MG tablet Take 1 tablet (10 mg total) by mouth daily. 30 tablet 3  . simvastatin (ZOCOR) 20 MG tablet Take 1 tablet (20 mg total) by mouth at bedtime. 90 tablet 4  . VITAMIN E PO Take 2 capsules by mouth daily.    Marland Kitchen terbinafine (LAMISIL) 250 MG tablet Take 1 tablet (250 mg total) by mouth daily. (Patient not taking: Reported on 08/02/2014) 30 tablet 2   No facility-administered medications prior to visit.   Allergies  Allergen Reactions  . Codeine Other (See Comments)    hallucinations  . Food     Milk, wheat, tomato, shrimp   Past Medical History  Diagnosis Date  . Multiple food allergies   . Hyperlipidemia   . Hypertension   . GERD (gastroesophageal reflux disease)   . IBS (irritable bowel syndrome)   . Unspecified vitamin D deficiency   . Osteopenia   . Arthritis     "hands" (09/17/2013)   Health Maintenance  Topic Date Due  . ZOSTAVAX  03/11/2000  . PNEUMOCOCCAL POLYSACCHARIDE VACCINE AGE 63 AND OVER  03/11/2005  . INFLUENZA VACCINE  01/05/2014  . TETANUS/TDAP  11/03/2015  . MAMMOGRAM  04/22/2016  . COLONOSCOPY  12/25/2017  . DEXA SCAN  Completed   Immunization History  Administered Date(s) Administered  . PPD Test 04/17/2013  . Td 11/02/2005   Past Surgical  History  Procedure Laterality Date  . Bladder suspension  05/2009  . Cholecystectomy  2000's  . Vaginal hysterectomy  ~ 1977  . Cataract extraction w/ intraocular lens  implant, bilateral Bilateral 2000's  . Anterior and posterior repair  05/2009   Family History  Problem Relation Age of Onset  . Stroke Other   . Hypertension Other   . Hyperlipidemia Other   . Cancer Other   . Asthma Other   . Heart attack Other   . Stroke Mother   . Hyperlipidemia Mother   . Cancer Father     mesothelioma  . Diabetes Sister   . Heart disease Brother   . Hyperlipidemia Brother   . Hypertension Brother   . Stroke Brother   . Hemochromatosis Son   .  Cancer Brother     bladder with mets to kidneys   History  Substance Use Topics  . Smoking status: Never Smoker   . Smokeless tobacco: Never Used  . Alcohol Use: No    ROS Constitutional: Denies fever, chills, weight loss/gain, headaches, insomnia, fatigue, night sweats, and change in appetite. Eyes: Denies redness, blurred vision, diplopia, discharge, itchy, watery eyes.  ENT: Denies discharge, congestion, post nasal drip, epistaxis, sore throat, earache, hearing loss, dental pain, Tinnitus, Vertigo, Sinus pain, snoring.  Cardio: Denies chest pain, palpitations, irregular heartbeat, syncope, dyspnea, diaphoresis, orthopnea, PND, claudication, edema Respiratory: denies cough, dyspnea, DOE, pleurisy, hoarseness, laryngitis, wheezing.  Gastrointestinal: Denies dysphagia, heartburn, reflux, water brash, pain, cramps, nausea, vomiting, bloating, diarrhea, constipation, hematemesis, melena, hematochezia, jaundice, hemorrhoids Genitourinary: Denies dysuria, frequency, urgency, nocturia, hesitancy, discharge, hematuria, flank pain Breast: Breast lumps, nipple discharge, bleeding.  Musculoskeletal: Denies arthralgia, myalgia, stiffness, Jt. Swelling, pain, limp, and strain/sprain. Denies falls. Skin: Denies puritis, rash, hives, warts, acne, eczema, changing in skin lesion Neuro: No weakness, tremor, incoordination, spasms, paresthesia, pain Psychiatric: Denies confusion, memory loss, sensory loss. Denies Depression. Endocrine: Denies change in weight, skin, hair change, nocturia, and paresthesia, diabetic polys, visual blurring, hyper / hypo glycemic episodes.  Heme/Lymph: No excessive bleeding, bruising, enlarged lymph nodes.  Physical Exam  BP 132/60 mmHg  Pulse 16  Temp(Src) 97.5 F (36.4 C)  Resp 60  Ht 5' 2.25" (1.581 m)  Wt 127 lb 3.2 oz (57.698 kg)  BMI 23.08 kg/m2  General Appearance: Well nourished and in no apparent distress. Eyes: PERRLA, EOMs, conjunctiva no swelling or  erythema, normal fundi and vessels. Sinuses: No frontal/maxillary tenderness ENT/Mouth: EACs patent / TMs  nl. Nares clear without erythema, swelling, mucoid exudates. Oral hygiene is good. No erythema, swelling, or exudate. Tongue normal, non-obstructing. Tonsils not swollen or erythematous. Hearing normal.  Neck: Supple, thyroid normal. No bruits, nodes or JVD. Respiratory: Respiratory effort normal.  BS equal and clear bilateral without rales, rhonci, wheezing or stridor. Cardio: Heart sounds are normal with regular rate and rhythm and no murmurs, rubs or gallops. Peripheral pulses are normal and equal bilaterally without edema. No aortic or femoral bruits. Chest: symmetric with normal excursions and percussion. Breasts: Symmetric, without lumps, nipple discharge, retractions, or fibrocystic changes.  Abdomen: Flat, soft, with bowl sounds. Nontender, no guarding, rebound, hernias, masses, or organomegaly.  Lymphatics: Non tender without lymphadenopathy.  Genitourinary:  Musculoskeletal: Full ROM all peripheral extremities, joint stability, 5/5 strength, and normal gait. Skin: Warm and dry without rashes, lesions, cyanosis, clubbing or  ecchymosis.  Neuro: Cranial nerves intact, reflexes equal bilaterally. Normal muscle tone, no cerebellar symptoms. Sensation intact.  Pysch: Awake and oriented X 3, normal affect,  Insight and Judgment appropriate.   Assessment and Plan  1. Essential hypertension  - Microalbumin / creatinine urine ratio - EKG 12-Lead - [place], RETROPERITNL ABD,  LTD - TSH  2. Mixed hyperlipidemia  - Lipid panel  3. Prediabetes  - Hemoglobin A1c - Insulin, fasting  4. Vitamin D deficiency  - Vit D  25 hydroxy (rtn osteoporosis monitoring)  5. Medication management  - Urine Microscopic - CBC with Differential/Platelet - BASIC METABOLIC PANEL WITH GFR - Hepatic function panel - Magnesium  6. Screening for rectal cancer  - POC Hemoccult Bld/Stl (3-Cd Home  Screen); Future  7. Depression screen   Continue prudent diet as discussed, weight control, BP monitoring, regular exercise, and medications. Discussed med's effects and SE's. Screening labs and tests as requested with regular follow-up as recommended. ROV 3 mo OV w/ Courtney & 6 mo OV w/ McK

## 2014-08-02 NOTE — Patient Instructions (Signed)

## 2014-08-03 LAB — VITAMIN D 25 HYDROXY (VIT D DEFICIENCY, FRACTURES): Vit D, 25-Hydroxy: 36 ng/mL (ref 30–100)

## 2014-08-03 LAB — URINALYSIS, MICROSCOPIC ONLY
CASTS: NONE SEEN
CRYSTALS: NONE SEEN
Squamous Epithelial / LPF: NONE SEEN

## 2014-08-03 LAB — HEMOGLOBIN A1C
Hgb A1c MFr Bld: 5.1 % (ref <5.7)
Mean Plasma Glucose: 100 mg/dL (ref <117)

## 2014-08-03 LAB — MICROALBUMIN / CREATININE URINE RATIO
Creatinine, Urine: 43.7 mg/dL
MICROALB UR: 0.3 mg/dL (ref <2.0)
Microalb Creat Ratio: 6.9 mg/g (ref 0.0–30.0)

## 2014-08-03 LAB — INSULIN, FASTING: Insulin fasting, serum: 5.3 u[IU]/mL (ref 2.0–19.6)

## 2014-08-04 ENCOUNTER — Encounter: Payer: Self-pay | Admitting: Internal Medicine

## 2014-08-08 ENCOUNTER — Other Ambulatory Visit: Payer: Self-pay | Admitting: Physician Assistant

## 2014-08-08 DIAGNOSIS — F411 Generalized anxiety disorder: Secondary | ICD-10-CM

## 2014-08-08 MED ORDER — BENZONATATE 100 MG PO CAPS: 100.0000 mg | ORAL_CAPSULE | Freq: Four times a day (QID) | ORAL | Status: DC | PRN

## 2014-08-08 MED ORDER — ALPRAZOLAM 0.5 MG PO TABS: ORAL_TABLET | ORAL | Status: DC

## 2014-08-21 ENCOUNTER — Other Ambulatory Visit: Payer: Self-pay | Admitting: *Deleted

## 2014-08-21 DIAGNOSIS — Z1212 Encounter for screening for malignant neoplasm of rectum: Secondary | ICD-10-CM

## 2014-08-21 LAB — POC HEMOCCULT BLD/STL (HOME/3-CARD/SCREEN)
Card #3 Fecal Occult Blood, POC: NEGATIVE
FECAL OCCULT BLD: NEGATIVE
FECAL OCCULT BLD: NEGATIVE

## 2014-09-06 DIAGNOSIS — D649 Anemia, unspecified: Secondary | ICD-10-CM | POA: Diagnosis not present

## 2014-09-06 DIAGNOSIS — Z01419 Encounter for gynecological examination (general) (routine) without abnormal findings: Secondary | ICD-10-CM | POA: Diagnosis not present

## 2014-09-06 DIAGNOSIS — Z1389 Encounter for screening for other disorder: Secondary | ICD-10-CM | POA: Diagnosis not present

## 2014-10-11 ENCOUNTER — Emergency Department (HOSPITAL_COMMUNITY): Payer: No Typology Code available for payment source

## 2014-10-11 ENCOUNTER — Encounter (HOSPITAL_COMMUNITY): Payer: Self-pay | Admitting: Emergency Medicine

## 2014-10-11 ENCOUNTER — Observation Stay (HOSPITAL_COMMUNITY)
Admission: EM | Admit: 2014-10-11 | Discharge: 2014-10-13 | Disposition: A | Discharge status: Home / Self Care | Payer: No Typology Code available for payment source | Attending: Surgery | Admitting: Surgery

## 2014-10-11 DIAGNOSIS — S2241XA Multiple fractures of ribs, right side, initial encounter for closed fracture: Secondary | ICD-10-CM

## 2014-10-11 DIAGNOSIS — Y92410 Unspecified street and highway as the place of occurrence of the external cause: Secondary | ICD-10-CM | POA: Insufficient documentation

## 2014-10-11 DIAGNOSIS — S2249XA Multiple fractures of ribs, unspecified side, initial encounter for closed fracture: Secondary | ICD-10-CM | POA: Diagnosis not present

## 2014-10-11 DIAGNOSIS — S069X0A Unspecified intracranial injury without loss of consciousness, initial encounter: Principal | ICD-10-CM | POA: Insufficient documentation

## 2014-10-11 DIAGNOSIS — E785 Hyperlipidemia, unspecified: Secondary | ICD-10-CM | POA: Diagnosis not present

## 2014-10-11 DIAGNOSIS — W19XXXA Unspecified fall, initial encounter: Secondary | ICD-10-CM

## 2014-10-11 DIAGNOSIS — S2232XA Fracture of one rib, left side, initial encounter for closed fracture: Secondary | ICD-10-CM

## 2014-10-11 DIAGNOSIS — I10 Essential (primary) hypertension: Secondary | ICD-10-CM | POA: Diagnosis not present

## 2014-10-11 DIAGNOSIS — K219 Gastro-esophageal reflux disease without esophagitis: Secondary | ICD-10-CM | POA: Diagnosis not present

## 2014-10-11 DIAGNOSIS — S2242XA Multiple fractures of ribs, left side, initial encounter for closed fracture: Secondary | ICD-10-CM | POA: Diagnosis not present

## 2014-10-11 DIAGNOSIS — S069XAA Unspecified intracranial injury with loss of consciousness status unknown, initial encounter: Secondary | ICD-10-CM

## 2014-10-11 DIAGNOSIS — S0003XA Contusion of scalp, initial encounter: Secondary | ICD-10-CM | POA: Diagnosis not present

## 2014-10-11 DIAGNOSIS — S069X9A Unspecified intracranial injury with loss of consciousness of unspecified duration, initial encounter: Secondary | ICD-10-CM

## 2014-10-11 DIAGNOSIS — S066X0A Traumatic subarachnoid hemorrhage without loss of consciousness, initial encounter: Secondary | ICD-10-CM | POA: Diagnosis not present

## 2014-10-11 DIAGNOSIS — S066X9A Traumatic subarachnoid hemorrhage with loss of consciousness of unspecified duration, initial encounter: Secondary | ICD-10-CM | POA: Diagnosis not present

## 2014-10-11 DIAGNOSIS — I609 Nontraumatic subarachnoid hemorrhage, unspecified: Secondary | ICD-10-CM

## 2014-10-11 DIAGNOSIS — Z885 Allergy status to narcotic agent status: Secondary | ICD-10-CM | POA: Diagnosis not present

## 2014-10-11 DIAGNOSIS — Z91018 Allergy to other foods: Secondary | ICD-10-CM | POA: Insufficient documentation

## 2014-10-11 DIAGNOSIS — E782 Mixed hyperlipidemia: Secondary | ICD-10-CM | POA: Insufficient documentation

## 2014-10-11 DIAGNOSIS — R4182 Altered mental status, unspecified: Secondary | ICD-10-CM | POA: Diagnosis not present

## 2014-10-11 DIAGNOSIS — K589 Irritable bowel syndrome without diarrhea: Secondary | ICD-10-CM | POA: Insufficient documentation

## 2014-10-11 DIAGNOSIS — Z91011 Allergy to milk products: Secondary | ICD-10-CM | POA: Insufficient documentation

## 2014-10-11 DIAGNOSIS — Z87820 Personal history of traumatic brain injury: Secondary | ICD-10-CM | POA: Diagnosis present

## 2014-10-11 DIAGNOSIS — S06899A Other specified intracranial injury with loss of consciousness of unspecified duration, initial encounter: Secondary | ICD-10-CM | POA: Diagnosis not present

## 2014-10-11 LAB — MRSA PCR SCREENING: MRSA BY PCR: POSITIVE — AB

## 2014-10-11 MED ORDER — POTASSIUM CHLORIDE IN NACL 20-0.45 MEQ/L-% IV SOLN
INTRAVENOUS | Status: DC
  Administered 2014-10-11: 18:00:00 via INTRAVENOUS
  Filled 2014-10-11 (×3): qty 1000

## 2014-10-11 MED ORDER — POLYETHYLENE GLYCOL 3350 17 G PO PACK
17.0000 g | PACK | Freq: Every day | ORAL | Status: DC
  Administered 2014-10-11 – 2014-10-13 (×2): 17 g via ORAL
  Filled 2014-10-11 (×3): qty 1

## 2014-10-11 MED ORDER — TRAMADOL HCL 50 MG PO TABS
50.0000 mg | ORAL_TABLET | Freq: Four times a day (QID) | ORAL | Status: DC | PRN
Start: 2014-10-11 — End: 2014-10-13
  Administered 2014-10-12: 50 mg via ORAL
  Administered 2014-10-12 – 2014-10-13 (×3): 100 mg via ORAL
  Administered 2014-10-13: 50 mg via ORAL
  Filled 2014-10-11 (×4): qty 2
  Filled 2014-10-11: qty 1

## 2014-10-11 MED ORDER — IBUPROFEN 200 MG PO TABS: 400.0000 mg | ORAL_TABLET | Freq: Four times a day (QID) | ORAL | Status: DC | PRN

## 2014-10-11 MED ORDER — ESTRADIOL 1 MG PO TABS
1.0000 mg | ORAL_TABLET | Freq: Every day | ORAL | Status: DC
  Administered 2014-10-12: 1 mg via ORAL
  Filled 2014-10-11 (×3): qty 1

## 2014-10-11 MED ORDER — PANTOPRAZOLE SODIUM 40 MG PO TBEC
40.0000 mg | DELAYED_RELEASE_TABLET | Freq: Every day | ORAL | Status: DC
  Administered 2014-10-12 – 2014-10-13 (×2): 40 mg via ORAL
  Filled 2014-10-11 (×3): qty 1

## 2014-10-11 MED ORDER — LOSARTAN POTASSIUM 50 MG PO TABS
50.0000 mg | ORAL_TABLET | Freq: Every day | ORAL | Status: DC
  Administered 2014-10-11: 50 mg via ORAL
  Filled 2014-10-11: qty 1

## 2014-10-11 MED ORDER — PANTOPRAZOLE SODIUM 40 MG IV SOLR
40.0000 mg | Freq: Every day | INTRAVENOUS | Status: DC
  Administered 2014-10-11: 40 mg via INTRAVENOUS
  Filled 2014-10-11 (×2): qty 40

## 2014-10-11 MED ORDER — ALPRAZOLAM 0.5 MG PO TABS
0.5000 mg | ORAL_TABLET | Freq: Two times a day (BID) | ORAL | Status: DC | PRN
  Administered 2014-10-13: 0.5 mg via ORAL
  Filled 2014-10-11: qty 1

## 2014-10-11 MED ORDER — MUPIROCIN 2 % EX OINT
1.0000 "application " | TOPICAL_OINTMENT | Freq: Two times a day (BID) | CUTANEOUS | Status: DC
  Administered 2014-10-11 – 2014-10-13 (×4): 1 via NASAL
  Filled 2014-10-11: qty 22

## 2014-10-11 MED ORDER — MORPHINE SULFATE 2 MG/ML IJ SOLN: 2.0000 mg | INTRAMUSCULAR | Status: DC | PRN

## 2014-10-11 MED ORDER — LOSARTAN POTASSIUM 50 MG PO TABS
25.0000 mg | ORAL_TABLET | Freq: Every day | ORAL | Status: DC
  Administered 2014-10-12: 25 mg via ORAL
  Filled 2014-10-11 (×2): qty 1

## 2014-10-11 MED ORDER — BENZONATATE 100 MG PO CAPS
100.0000 mg | ORAL_CAPSULE | Freq: Four times a day (QID) | ORAL | Status: DC | PRN
Start: 2014-10-11 — End: 2014-10-13
  Filled 2014-10-11: qty 1

## 2014-10-11 MED ORDER — TERBINAFINE HCL 250 MG PO TABS
250.0000 mg | ORAL_TABLET | Freq: Every day | ORAL | Status: DC
  Administered 2014-10-11 – 2014-10-12 (×2): 250 mg via ORAL
  Filled 2014-10-11 (×4): qty 1

## 2014-10-11 MED ORDER — ONDANSETRON HCL 4 MG/2ML IJ SOLN
4.0000 mg | Freq: Four times a day (QID) | INTRAMUSCULAR | Status: DC | PRN
Start: 2014-10-11 — End: 2014-10-13
  Administered 2014-10-12: 4 mg via INTRAVENOUS
  Filled 2014-10-11: qty 2

## 2014-10-11 MED ORDER — ONDANSETRON HCL 4 MG PO TABS: 4.0000 mg | ORAL_TABLET | Freq: Four times a day (QID) | ORAL | Status: DC | PRN

## 2014-10-11 MED ORDER — LISINOPRIL 10 MG PO TABS
10.0000 mg | ORAL_TABLET | Freq: Every day | ORAL | Status: DC
  Administered 2014-10-11: 10 mg via ORAL
  Filled 2014-10-11: qty 1

## 2014-10-11 MED ORDER — CHLORHEXIDINE GLUCONATE CLOTH 2 % EX PADS
6.0000 | MEDICATED_PAD | Freq: Every day | CUTANEOUS | Status: DC
  Administered 2014-10-12 – 2014-10-13 (×2): 6 via TOPICAL

## 2014-10-11 MED ORDER — SIMVASTATIN 20 MG PO TABS
20.0000 mg | ORAL_TABLET | Freq: Every day | ORAL | Status: DC
  Administered 2014-10-11 – 2014-10-12 (×2): 20 mg via ORAL
  Filled 2014-10-11 (×3): qty 1

## 2014-10-11 MED ORDER — DOCUSATE SODIUM 100 MG PO CAPS
100.0000 mg | ORAL_CAPSULE | Freq: Two times a day (BID) | ORAL | Status: DC
  Administered 2014-10-12 – 2014-10-13 (×2): 100 mg via ORAL
  Filled 2014-10-11 (×4): qty 1

## 2014-10-11 NOTE — ED Notes (Signed)
Neurosurgeon at bedside °
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
## 2014-10-11 NOTE — ED Notes (Signed)
Pt transported to CT by this RN, pt in nad.
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
## 2014-10-11 NOTE — ED Notes (Signed)
Called [place] PA in regards to pt neuro assessment change. Stat head CT order placed. CT called and notified. CT states will call when bed is ready.
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
## 2014-10-12 ENCOUNTER — Observation Stay (HOSPITAL_COMMUNITY): Payer: No Typology Code available for payment source

## 2014-10-12 DIAGNOSIS — K589 Irritable bowel syndrome without diarrhea: Secondary | ICD-10-CM | POA: Diagnosis not present

## 2014-10-12 DIAGNOSIS — S066X0A Traumatic subarachnoid hemorrhage without loss of consciousness, initial encounter: Secondary | ICD-10-CM | POA: Diagnosis not present

## 2014-10-12 DIAGNOSIS — S0003XA Contusion of scalp, initial encounter: Secondary | ICD-10-CM | POA: Diagnosis not present

## 2014-10-12 DIAGNOSIS — E785 Hyperlipidemia, unspecified: Secondary | ICD-10-CM | POA: Diagnosis not present

## 2014-10-12 DIAGNOSIS — S2231XA Fracture of one rib, right side, initial encounter for closed fracture: Secondary | ICD-10-CM | POA: Diagnosis not present

## 2014-10-12 DIAGNOSIS — S2241XA Multiple fractures of ribs, right side, initial encounter for closed fracture: Secondary | ICD-10-CM | POA: Diagnosis not present

## 2014-10-12 DIAGNOSIS — S2249XA Multiple fractures of ribs, unspecified side, initial encounter for closed fracture: Secondary | ICD-10-CM | POA: Diagnosis not present

## 2014-10-12 DIAGNOSIS — S06899A Other specified intracranial injury with loss of consciousness of unspecified duration, initial encounter: Secondary | ICD-10-CM | POA: Diagnosis not present

## 2014-10-12 DIAGNOSIS — K219 Gastro-esophageal reflux disease without esophagitis: Secondary | ICD-10-CM | POA: Diagnosis not present

## 2014-10-12 DIAGNOSIS — S069X0A Unspecified intracranial injury without loss of consciousness, initial encounter: Secondary | ICD-10-CM | POA: Diagnosis not present

## 2014-10-12 DIAGNOSIS — I10 Essential (primary) hypertension: Secondary | ICD-10-CM | POA: Diagnosis not present

## 2014-10-12 DIAGNOSIS — E782 Mixed hyperlipidemia: Secondary | ICD-10-CM | POA: Diagnosis not present

## 2014-10-12 LAB — BASIC METABOLIC PANEL
Anion gap: 9 (ref 5–15)
BUN: 14 mg/dL (ref 6–20)
CALCIUM: 8.9 mg/dL (ref 8.9–10.3)
CO2: 24 mmol/L (ref 22–32)
CREATININE: 0.77 mg/dL (ref 0.44–1.00)
Chloride: 104 mmol/L (ref 101–111)
GFR calc Af Amer: >60 mL/min (ref >60)
GLUCOSE: 94 mg/dL (ref 70–99)
POTASSIUM: 3.9 mmol/L (ref 3.5–5.1)
Sodium: 137 mmol/L (ref 135–145)

## 2014-10-12 LAB — CBC
HEMATOCRIT: 34.9 % — AB (ref 36.0–46.0)
HEMOGLOBIN: 12 g/dL (ref 12.0–15.0)
MCH: 32.6 pg (ref 26.0–34.0)
MCHC: 34.4 g/dL (ref 30.0–36.0)
MCV: 94.8 fL (ref 78.0–100.0)
Platelets: 244 10*3/uL (ref 150–400)
RBC: 3.68 MIL/uL — ABNORMAL LOW (ref 3.87–5.11)
RDW: 12.8 % (ref 11.5–15.5)
WBC: 5.9 10*3/uL (ref 4.0–10.5)

## 2014-10-12 MED ORDER — OXYCODONE-ACETAMINOPHEN 5-325 MG PO TABS
1.0000 | ORAL_TABLET | ORAL | Status: DC | PRN
  Administered 2014-10-12: 2 via ORAL
  Administered 2014-10-13: 1 via ORAL
  Filled 2014-10-12: qty 2
  Filled 2014-10-12: qty 1

## 2014-10-12 NOTE — Progress Notes (Signed)
Attempted to call report

## 2014-10-12 NOTE — Progress Notes (Signed)
Report called to [facility] on 4N. Pt transferred to 4N22. Family at bedside and aware of pt transfer.

## 2014-10-12 NOTE — Progress Notes (Signed)
UR completed 

## 2014-10-12 NOTE — Progress Notes (Signed)
Pt arrived via wheelchair from [place] to 4N22 at 1200. Pt A&O x 4, c/o 3/10 left side, V/S taken.Contact precautions for MRSA. Pt without distress.Daughter at the bedside. Advised of safety precautions, bed alarm intact and call bell within reach. Will continue to monitor.
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
## 2014-10-13 DIAGNOSIS — S0003XA Contusion of scalp, initial encounter: Secondary | ICD-10-CM | POA: Diagnosis not present

## 2014-10-13 DIAGNOSIS — K589 Irritable bowel syndrome without diarrhea: Secondary | ICD-10-CM | POA: Diagnosis not present

## 2014-10-13 DIAGNOSIS — S069X0A Unspecified intracranial injury without loss of consciousness, initial encounter: Secondary | ICD-10-CM | POA: Diagnosis not present

## 2014-10-13 DIAGNOSIS — S2241XA Multiple fractures of ribs, right side, initial encounter for closed fracture: Secondary | ICD-10-CM | POA: Diagnosis not present

## 2014-10-13 DIAGNOSIS — I10 Essential (primary) hypertension: Secondary | ICD-10-CM | POA: Diagnosis not present

## 2014-10-13 DIAGNOSIS — E785 Hyperlipidemia, unspecified: Secondary | ICD-10-CM | POA: Diagnosis not present

## 2014-10-13 DIAGNOSIS — E782 Mixed hyperlipidemia: Secondary | ICD-10-CM | POA: Diagnosis not present

## 2014-10-13 DIAGNOSIS — K219 Gastro-esophageal reflux disease without esophagitis: Secondary | ICD-10-CM | POA: Diagnosis not present

## 2014-10-13 DIAGNOSIS — S066X0A Traumatic subarachnoid hemorrhage without loss of consciousness, initial encounter: Secondary | ICD-10-CM | POA: Diagnosis not present

## 2014-10-13 MED ORDER — BACLOFEN 10 MG PO TABS
10.0000 mg | ORAL_TABLET | Freq: Three times a day (TID) | ORAL | Status: DC
Start: 2014-10-13 — End: 2015-03-14

## 2014-10-13 MED ORDER — TRAMADOL HCL 50 MG PO TABS: 50.0000 mg | ORAL_TABLET | Freq: Four times a day (QID) | ORAL | Status: DC | PRN

## 2014-10-13 MED ORDER — OXYCODONE-ACETAMINOPHEN 5-325 MG PO TABS: 1.0000 | ORAL_TABLET | ORAL | Status: DC | PRN

## 2014-10-13 MED ORDER — MUPIROCIN 2 % EX OINT: 1.0000 "application " | TOPICAL_OINTMENT | Freq: Two times a day (BID) | CUTANEOUS | Status: DC
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
## 2014-10-15 DIAGNOSIS — E782 Mixed hyperlipidemia: Secondary | ICD-10-CM | POA: Diagnosis not present

## 2014-10-15 DIAGNOSIS — M8580 Other specified disorders of bone density and structure, unspecified site: Secondary | ICD-10-CM | POA: Diagnosis not present

## 2014-10-15 DIAGNOSIS — S066X9D Traumatic subarachnoid hemorrhage with loss of consciousness of unspecified duration, subsequent encounter: Secondary | ICD-10-CM

## 2014-10-15 DIAGNOSIS — M19049 Primary osteoarthritis, unspecified hand: Secondary | ICD-10-CM | POA: Diagnosis not present

## 2014-10-15 DIAGNOSIS — S2242XD Multiple fractures of ribs, left side, subsequent encounter for fracture with routine healing: Secondary | ICD-10-CM

## 2014-10-15 DIAGNOSIS — K219 Gastro-esophageal reflux disease without esophagitis: Secondary | ICD-10-CM | POA: Diagnosis not present

## 2014-10-15 DIAGNOSIS — E559 Vitamin D deficiency, unspecified: Secondary | ICD-10-CM | POA: Diagnosis not present

## 2014-10-15 DIAGNOSIS — S0003XD Contusion of scalp, subsequent encounter: Secondary | ICD-10-CM | POA: Diagnosis not present

## 2014-10-17 DIAGNOSIS — E782 Mixed hyperlipidemia: Secondary | ICD-10-CM | POA: Diagnosis not present

## 2014-10-17 DIAGNOSIS — M19049 Primary osteoarthritis, unspecified hand: Secondary | ICD-10-CM | POA: Diagnosis not present

## 2014-10-17 DIAGNOSIS — E559 Vitamin D deficiency, unspecified: Secondary | ICD-10-CM | POA: Diagnosis not present

## 2014-10-17 DIAGNOSIS — S0003XD Contusion of scalp, subsequent encounter: Secondary | ICD-10-CM | POA: Diagnosis not present

## 2014-10-17 DIAGNOSIS — M8580 Other specified disorders of bone density and structure, unspecified site: Secondary | ICD-10-CM | POA: Diagnosis not present

## 2014-10-17 DIAGNOSIS — K219 Gastro-esophageal reflux disease without esophagitis: Secondary | ICD-10-CM | POA: Diagnosis not present

## 2014-10-17 DIAGNOSIS — S2242XD Multiple fractures of ribs, left side, subsequent encounter for fracture with routine healing: Secondary | ICD-10-CM | POA: Diagnosis not present

## 2014-10-17 DIAGNOSIS — S066X9D Traumatic subarachnoid hemorrhage with loss of consciousness of unspecified duration, subsequent encounter: Secondary | ICD-10-CM | POA: Diagnosis not present

## 2014-10-18 ENCOUNTER — Ambulatory Visit (INDEPENDENT_AMBULATORY_CARE_PROVIDER_SITE_OTHER): Payer: Commercial Managed Care - HMO | Admitting: Internal Medicine

## 2014-10-18 ENCOUNTER — Encounter: Payer: Self-pay | Admitting: Internal Medicine

## 2014-10-18 VITALS — BP 132/60 | HR 64 | Temp 97.8°F | Resp 16 | Ht 62.25 in | Wt 127.0 lb

## 2014-10-18 DIAGNOSIS — S2239XD Fracture of one rib, unspecified side, subsequent encounter for fracture with routine healing: Secondary | ICD-10-CM

## 2014-10-18 DIAGNOSIS — I609 Nontraumatic subarachnoid hemorrhage, unspecified: Secondary | ICD-10-CM | POA: Diagnosis not present

## 2014-10-18 DIAGNOSIS — S0003XA Contusion of scalp, initial encounter: Secondary | ICD-10-CM
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
## 2014-10-18 NOTE — Patient Instructions (Signed)
Hematoma A hematoma is a collection of blood under the skin, in an organ, in a body space, in a joint space, or in other tissue. The blood can clot to form a lump that you can see and feel. The lump is often firm and may sometimes become sore and tender. Most hematomas get better in a few days to weeks. However, some hematomas may be serious and require medical care. Hematomas can range in size from very small to very large. CAUSES  A hematoma can be caused by a blunt or penetrating injury. It can also be caused by spontaneous leakage from a blood vessel under the skin. Spontaneous leakage from a blood vessel is more likely to occur in older people, especially those taking blood thinners. Sometimes, a hematoma can develop after certain medical procedures. SIGNS AND SYMPTOMS   A firm lump on the body.  Possible pain and tenderness in the area.  Bruising.Blue, dark blue, purple-red, or yellowish skin may appear at the site of the hematoma if the hematoma is close to the surface of the skin. For hematomas in deeper tissues or body spaces, the signs and symptoms may be subtle. For example, an intra-abdominal hematoma may cause abdominal pain, weakness, fainting, and shortness of breath. An intracranial hematoma may cause a headache or symptoms such as weakness, trouble speaking, or a change in consciousness. DIAGNOSIS  A hematoma can usually be diagnosed based on your medical history and a physical exam. Imaging tests may be needed if your health care provider suspects a hematoma in deeper tissues or body spaces, such as the abdomen, head, or chest. These tests may include ultrasonography or a CT scan.  TREATMENT  Hematomas usually go away on their own over time. Rarely does the blood need to be drained out of the body. Large hematomas or those that may affect vital organs will sometimes need surgical drainage or monitoring. HOME CARE INSTRUCTIONS   Apply ice to the injured area:   Put ice in a  plastic bag.   Place a towel between your skin and the bag.   Leave the ice on for 20 minutes, 2-3 times a day for the first 1 to 2 days.   After the first 2 days, switch to using warm compresses on the hematoma.   Elevate the injured area to help decrease pain and swelling. Wrapping the area with an elastic bandage may also be helpful. Compression helps to reduce swelling and promotes shrinking of the hematoma. Make sure the bandage is not wrapped too tight.   If your hematoma is on a lower extremity and is painful, crutches may be helpful for a couple days.   Only take over-the-counter or prescription medicines as directed by your health care provider. SEEK IMMEDIATE MEDICAL CARE IF:   You have increasing pain, or your pain is not controlled with medicine.   You have a fever.   You have worsening swelling or discoloration.   Your skin over the hematoma breaks or starts bleeding.   Your hematoma is in your chest or abdomen and you have weakness, shortness of breath, or a change in consciousness.  Your hematoma is on your scalp (caused by a fall or injury) and you have a worsening headache or a change in alertness or consciousness. MAKE SURE YOU:   Understand these instructions.  Will watch your condition.  Will get help right away if you are not doing well or get worse. Document Released: 01/06/2004 Document Revised: 01/24/2013 Document Reviewed: 11/01/2012   [employer] Patient Information 2015 [place]. This information is not intended to replace advice given to you by your health care provider. Make sure you discuss any questions you have with your health care provider.  Concussion A concussion, or closed-head injury, is a brain injury caused by a direct blow to the head or by a quick and sudden movement (jolt) of the head or neck. Concussions are usually not life-threatening. Even so, the effects of a concussion can be serious. If you have had a concussion  before, you are more likely to experience concussion-like symptoms after a direct blow to the head.  CAUSES  Direct blow to the head, such as from running into another player during a soccer game, being hit in a fight, or hitting your head on a hard surface.  A jolt of the head or neck that causes the brain to move back and forth inside the skull, such as in a car crash. SIGNS AND SYMPTOMS The signs of a concussion can be hard to notice. Early on, they may be missed by you, family members, and health care providers. You may look fine but act or feel differently. Symptoms are usually temporary, but they may last for days, weeks, or even longer. Some symptoms may appear right away while others may not show up for hours or days. Every head injury is different. Symptoms include:  Mild to moderate headaches that will not go away.  A feeling of pressure inside your head.  Having more trouble than usual:  Learning or remembering things you have heard.  Answering questions.  Paying attention or concentrating.  Organizing daily tasks.  Making decisions and solving problems.  Slowness in thinking, acting or reacting, speaking, or reading.  Getting lost or being easily confused.  Feeling tired all the time or lacking energy (fatigued).  Feeling drowsy.  Sleep disturbances.  Sleeping more than usual.  Sleeping less than usual.  Trouble falling asleep.  Trouble sleeping (insomnia).  Loss of balance or feeling lightheaded or dizzy.  Nausea or vomiting.  Numbness or tingling.  Increased sensitivity to:  Sounds.  Lights.  Distractions.  Vision problems or eyes that tire easily.  Diminished sense of taste or smell.  Ringing in the ears.  Mood changes such as feeling sad or anxious.  Becoming easily irritated or angry for little or no reason.  Lack of motivation.  Seeing or hearing things other people do not see or hear (hallucinations). DIAGNOSIS Your health  care provider can usually diagnose a concussion based on a description of your injury and symptoms. He or she will ask whether you passed out (lost consciousness) and whether you are having trouble remembering events that happened right before and during your injury. Your evaluation might include:  A brain scan to look for signs of injury to the brain. Even if the test shows no injury, you may still have a concussion.  Blood tests to be sure other problems are not present. TREATMENT  Concussions are usually treated in an emergency department, in urgent care, or at a clinic. You may need to stay in the hospital overnight for further treatment.  Tell your health care provider if you are taking any medicines, including prescription medicines, over-the-counter medicines, and natural remedies. Some medicines, such as blood thinners (anticoagulants) and aspirin, may increase the chance of complications. Also tell your health care provider whether you have had alcohol or are taking illegal drugs. This information may affect treatment.  Your health care provider will  send you home with important instructions to follow.  How fast you will recover from a concussion depends on many factors. These factors include how severe your concussion is, what part of your brain was injured, your age, and how healthy you were before the concussion.  Most people with mild injuries recover fully. Recovery can take time. In general, recovery is slower in older persons. Also, persons who have had a concussion in the past or have other medical problems may find that it takes longer to recover from their current injury. HOME CARE INSTRUCTIONS General Instructions  Carefully follow the directions your health care provider gave you.  Only take over-the-counter or prescription medicines for pain, discomfort, or fever as directed by your health care provider.  Take only those medicines that your health care provider has  approved.  Do not drink alcohol until your health care provider says you are well enough to do so. Alcohol and certain other drugs may slow your recovery and can put you at risk of further injury.  If it is harder than usual to remember things, write them down.  If you are easily distracted, try to do one thing at a time. For example, do not try to watch TV while fixing dinner.  Talk with family members or close friends when making important decisions.  Keep all follow-up appointments. Repeated evaluation of your symptoms is recommended for your recovery.  Watch your symptoms and tell others to do the same. Complications sometimes occur after a concussion. Older adults with a brain injury may have a higher risk of serious complications, such as a blood clot on the brain.  Tell your teachers, school nurse, school counselor, coach, athletic trainer, or work Freight forwarder about your injury, symptoms, and restrictions. Tell them about what you can or cannot do. They should watch for:  Increased problems with attention or concentration.  Increased difficulty remembering or learning new information.  Increased time needed to complete tasks or assignments.  Increased irritability or decreased ability to cope with stress.  Increased symptoms.  Rest. Rest helps the brain to heal. Make sure you:  Get plenty of sleep at night. Avoid staying up late at night.  Keep the same bedtime hours on weekends and weekdays.  Rest during the day. Take daytime naps or rest breaks when you feel tired.  Limit activities that require a lot of thought or concentration. These include:  Doing homework or job-related work.  Watching TV.  Working on the computer.  Avoid any situation where there is potential for another head injury (football, hockey, soccer, basketball, martial arts, downhill snow sports and horseback riding). Your condition will get worse every time you experience a concussion. You should avoid  these activities until you are evaluated by the appropriate follow-up health care providers. Returning To Your Regular Activities You will need to return to your normal activities slowly, not all at once. You must give your body and brain enough time for recovery.  Do not return to sports or other athletic activities until your health care provider tells you it is safe to do so.  Ask your health care provider when you can drive, ride a bicycle, or operate heavy machinery. Your ability to react may be slower after a brain injury. Never do these activities if you are dizzy.  Ask your health care provider about when you can return to work or school. Preventing Another Concussion It is very important to avoid another brain injury, especially before you have recovered.  In rare cases, another injury can lead to permanent brain damage, brain swelling, or death. The risk of this is greatest during the first 7-10 days after a head injury. Avoid injuries by:  Wearing a seat belt when riding in a car.  Drinking alcohol only in moderation.  Wearing a helmet when biking, skiing, skateboarding, skating, or doing similar activities.  Avoiding activities that could lead to a second concussion, such as contact or recreational sports, until your health care provider says it is okay.  Taking safety measures in your home.  Remove clutter and tripping hazards from floors and stairways.  Use grab bars in bathrooms and handrails by stairs.  Place non-slip mats on floors and in bathtubs.  Improve lighting in dim areas. SEEK MEDICAL CARE IF:  You have increased problems paying attention or concentrating.  You have increased difficulty remembering or learning new information.  You need more time to complete tasks or assignments than before.  You have increased irritability or decreased ability to cope with stress.  You have more symptoms than before. Seek medical care if you have any of the following  symptoms for more than 2 weeks after your injury:  Lasting (chronic) headaches.  Dizziness or balance problems.  Nausea.  Vision problems.  Increased sensitivity to noise or light.  Depression or mood swings.  Anxiety or irritability.  Memory problems.  Difficulty concentrating or paying attention.  Sleep problems.  Feeling tired all the time. SEEK IMMEDIATE MEDICAL CARE IF:  You have severe or worsening headaches. These may be a sign of a blood clot in the brain.  You have weakness (even if only in one hand, leg, or part of the face).  You have numbness.  You have decreased coordination.  You vomit repeatedly.  You have increased sleepiness.  One pupil is larger than the other.  You have convulsions.  You have slurred speech.  You have increased confusion. This may be a sign of a blood clot in the brain.  You have increased restlessness, agitation, or irritability.  You are unable to recognize people or places.  You have neck pain.  It is difficult to wake you up.  You have unusual behavior changes.  You lose consciousness. MAKE SURE YOU:  Understand these instructions.  Will watch your condition.  Will get help right away if you are not doing well or get worse. Document Released: 08/14/2003 Document Revised: 05/29/2013 Document Reviewed: 12/14/2012 [facility] Patient Information 2015 [place], [place]. This information is not intended to replace advice given to you by your health care provider. Make sure you discuss any questions you have with your health care provider.

## 2014-10-23 ENCOUNTER — Encounter: Payer: Self-pay | Admitting: Internal Medicine

## 2014-10-24 DIAGNOSIS — M19049 Primary osteoarthritis, unspecified hand: Secondary | ICD-10-CM | POA: Diagnosis not present

## 2014-10-24 DIAGNOSIS — M8580 Other specified disorders of bone density and structure, unspecified site: Secondary | ICD-10-CM | POA: Diagnosis not present

## 2014-10-24 DIAGNOSIS — S066X9D Traumatic subarachnoid hemorrhage with loss of consciousness of unspecified duration, subsequent encounter: Secondary | ICD-10-CM | POA: Diagnosis not present

## 2014-10-24 DIAGNOSIS — S2242XD Multiple fractures of ribs, left side, subsequent encounter for fracture with routine healing: Secondary | ICD-10-CM | POA: Diagnosis not present

## 2014-10-24 DIAGNOSIS — S0003XD Contusion of scalp, subsequent encounter: Secondary | ICD-10-CM | POA: Diagnosis not present

## 2014-10-24 DIAGNOSIS — K219 Gastro-esophageal reflux disease without esophagitis: Secondary | ICD-10-CM | POA: Diagnosis not present

## 2014-10-24 DIAGNOSIS — E782 Mixed hyperlipidemia: Secondary | ICD-10-CM | POA: Diagnosis not present

## 2014-10-24 DIAGNOSIS — E559 Vitamin D deficiency, unspecified: Secondary | ICD-10-CM | POA: Diagnosis not present

## 2014-10-28 ENCOUNTER — Ambulatory Visit (INDEPENDENT_AMBULATORY_CARE_PROVIDER_SITE_OTHER): Payer: Commercial Managed Care - HMO | Admitting: Internal Medicine

## 2014-10-28 ENCOUNTER — Encounter: Payer: Self-pay | Admitting: Internal Medicine

## 2014-10-28 VITALS — BP 126/70 | HR 56 | Temp 98.2°F | Resp 16 | Ht 62.25 in | Wt 130.0 lb

## 2014-10-28 DIAGNOSIS — S0003XD Contusion of scalp, subsequent encounter: Secondary | ICD-10-CM | POA: Diagnosis not present

## 2014-10-28 DIAGNOSIS — S2239XD Fracture of one rib, unspecified side, subsequent encounter for fracture with routine healing: Secondary | ICD-10-CM | POA: Diagnosis not present

## 2014-10-28 NOTE — Patient Instructions (Signed)
Rib Fracture °A rib fracture is a break or crack in one of the bones of the ribs. The ribs are like a cage that goes around your upper chest. A broken or cracked rib is often painful, but most do not cause other problems. Most rib fractures heal on their own in 1-3 months. °HOME CARE °· Avoid activities that cause pain to the injured area. Protect your injured area. °· Slowly increase activity as told by your doctor. °· Take medicine as told by your doctor. °· Put ice on the injured area for the first 1-2 days after you have been treated or as told by your doctor. °¨ Put ice in a plastic bag. °¨ Place a towel between your skin and the bag. °¨ Leave the ice on for 15-20 minutes at a time, every 2 hours while you are awake. °· Do deep breathing as told by your doctor. You may be told to: °¨ Take deep breaths many times a day. °¨ Cough many times a day while hugging a pillow. °¨ Use a device (incentive spirometer) to perform deep breathing many times a day. °· Drink enough fluids to keep your pee (urine) clear or pale yellow.   °· Do not wear a rib belt or binder. These do not allow you to breathe deeply. °GET HELP RIGHT AWAY IF:  °· You have a fever. °· You have trouble breathing.   °· You cannot stop coughing. °· You cough up thick or bloody spit (mucus).   °· You feel sick to your stomach (nauseous), throw up (vomit), or have belly (abdominal) pain.   °· Your pain gets worse and medicine does not help.   °MAKE SURE YOU:  °· Understand these instructions. °· Will watch your condition. °· Will get help right away if you are not doing well or get worse. °Document Released: 03/02/2008 Document Revised: 09/18/2012 Document Reviewed: 07/26/2012 °Exit[employer]® Patient Information ©2015 [employer]. This information is not intended to replace advice given to you by your health care provider. Make sure you discuss any questions you have with your health care provider. ° °
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
## 2014-12-26 ENCOUNTER — Other Ambulatory Visit: Payer: Self-pay | Admitting: Internal Medicine

## 2015-02-07 ENCOUNTER — Ambulatory Visit: Payer: Self-pay | Admitting: Internal Medicine

## 2015-03-14 ENCOUNTER — Encounter: Payer: Self-pay | Admitting: Internal Medicine

## 2015-03-14 ENCOUNTER — Ambulatory Visit (INDEPENDENT_AMBULATORY_CARE_PROVIDER_SITE_OTHER): Payer: Commercial Managed Care - HMO | Admitting: Internal Medicine

## 2015-03-14 ENCOUNTER — Other Ambulatory Visit: Payer: Self-pay | Admitting: Internal Medicine

## 2015-03-14 ENCOUNTER — Other Ambulatory Visit: Payer: Self-pay | Admitting: *Deleted

## 2015-03-14 VITALS — BP 106/56 | HR 60 | Temp 97.7°F | Resp 16 | Ht 62.5 in | Wt 130.8 lb

## 2015-03-14 DIAGNOSIS — Z79899 Other long term (current) drug therapy: Secondary | ICD-10-CM

## 2015-03-14 DIAGNOSIS — R7303 Prediabetes: Secondary | ICD-10-CM

## 2015-03-14 DIAGNOSIS — Z6823 Body mass index (BMI) 23.0-23.9, adult: Secondary | ICD-10-CM

## 2015-03-14 DIAGNOSIS — Z1331 Encounter for screening for depression: Secondary | ICD-10-CM

## 2015-03-14 DIAGNOSIS — E559 Vitamin D deficiency, unspecified: Secondary | ICD-10-CM | POA: Diagnosis not present

## 2015-03-14 DIAGNOSIS — R7309 Other abnormal glucose: Secondary | ICD-10-CM | POA: Diagnosis not present

## 2015-03-14 DIAGNOSIS — E782 Mixed hyperlipidemia: Secondary | ICD-10-CM | POA: Diagnosis not present

## 2015-03-14 DIAGNOSIS — I10 Essential (primary) hypertension: Secondary | ICD-10-CM

## 2015-03-14 DIAGNOSIS — Z789 Other specified health status: Secondary | ICD-10-CM

## 2015-03-14 DIAGNOSIS — Z9181 History of falling: Secondary | ICD-10-CM

## 2015-03-14 DIAGNOSIS — Z1389 Encounter for screening for other disorder: Secondary | ICD-10-CM | POA: Diagnosis not present

## 2015-03-14 DIAGNOSIS — F411 Generalized anxiety disorder: Secondary | ICD-10-CM

## 2015-03-14 LAB — BASIC METABOLIC PANEL WITH GFR
BUN: 15 mg/dL (ref 7–25)
CHLORIDE: 104 mmol/L (ref 98–110)
CO2: 29 mmol/L (ref 20–31)
Calcium: 9.2 mg/dL (ref 8.6–10.4)
Creat: 0.73 mg/dL (ref 0.60–0.93)
GFR, EST NON AFRICAN AMERICAN: 81 mL/min (ref >=60)
GFR, Est African American: >89 mL/min (ref >=60)
Glucose, Bld: 62 mg/dL — ABNORMAL LOW (ref 65–99)
POTASSIUM: 4.1 mmol/L (ref 3.5–5.3)
SODIUM: 138 mmol/L (ref 135–146)

## 2015-03-14 LAB — HEMOGLOBIN A1C
HEMOGLOBIN A1C: 5.5 % (ref <5.7)
Mean Plasma Glucose: 111 mg/dL (ref <117)

## 2015-03-14 LAB — HEPATIC FUNCTION PANEL
ALK PHOS: 62 U/L (ref 33–130)
ALT: 17 U/L (ref 6–29)
AST: 17 U/L (ref 10–35)
Albumin: 4 g/dL (ref 3.6–5.1)
BILIRUBIN DIRECT: 0.1 mg/dL (ref <=0.2)
BILIRUBIN INDIRECT: 0.4 mg/dL (ref 0.2–1.2)
BILIRUBIN TOTAL: 0.5 mg/dL (ref 0.2–1.2)
Total Protein: 6 g/dL — ABNORMAL LOW (ref 6.1–8.1)

## 2015-03-14 LAB — CBC WITH DIFFERENTIAL/PLATELET
BASOS ABS: 0 10*3/uL (ref 0.0–0.1)
BASOS PCT: 0 % (ref 0–1)
EOS ABS: 0.2 10*3/uL (ref 0.0–0.7)
Eosinophils Relative: 5 % (ref 0–5)
HCT: 38.1 % (ref 36.0–46.0)
HEMOGLOBIN: 12.7 g/dL (ref 12.0–15.0)
Lymphocytes Relative: 23 % (ref 12–46)
Lymphs Abs: 1.1 10*3/uL (ref 0.7–4.0)
MCH: 31.9 pg (ref 26.0–34.0)
MCHC: 33.3 g/dL (ref 30.0–36.0)
MCV: 95.7 fL (ref 78.0–100.0)
MONOS PCT: 9 % (ref 3–12)
MPV: 10.1 fL (ref 8.6–12.4)
Monocytes Absolute: 0.4 10*3/uL (ref 0.1–1.0)
NEUTROS ABS: 2.9 10*3/uL (ref 1.7–7.7)
NEUTROS PCT: 63 % (ref 43–77)
PLATELETS: 276 10*3/uL (ref 150–400)
RBC: 3.98 MIL/uL (ref 3.87–5.11)
RDW: 12.9 % (ref 11.5–15.5)
WBC: 4.6 10*3/uL (ref 4.0–10.5)

## 2015-03-14 LAB — MAGNESIUM: Magnesium: 1.9 mg/dL (ref 1.5–2.5)

## 2015-03-14 LAB — LIPID PANEL
Cholesterol: 179 mg/dL (ref 125–200)
HDL: 51 mg/dL (ref >=46)
LDL Cholesterol: 97 mg/dL (ref <130)
Total CHOL/HDL Ratio: 3.5 Ratio (ref <=5.0)
Triglycerides: 156 mg/dL — ABNORMAL HIGH (ref <150)
VLDL: 31 mg/dL — AB (ref <30)

## 2015-03-14 MED ORDER — ALPRAZOLAM 0.5 MG PO TABS
0.5000 mg | ORAL_TABLET | Freq: Two times a day (BID) | ORAL | Status: DC | PRN
Start: 2015-03-14 — End: 2015-03-14

## 2015-03-14 NOTE — Patient Instructions (Signed)

## 2015-03-14 NOTE — Progress Notes (Signed)
Patient ID: Ashley Mayer, female   DOB: [birth year], 74 y.o.   MRN: [id number]   This very nice 74 y.o. [facility] presents for follow up with Hypertension, Hyperlipidemia, Pre-Diabetes and Vitamin D Deficiency.    Patient is treated for HTN  Since 2005 & BP has been controlled at home. Today's BP: (!) 106/56 mmHg. Patient has had no complaints of any cardiac type chest pain, palpitations, dyspnea/orthopnea/PND, dizziness, claudication, or dependent edema.   Hyperlipidemia is controlled with diet & meds. Patient denies myalgias or other med SE's. Last Lipids were Cholesterol 177; HDL 63; LDL 84; Triglycerides 148 on  08/02/2014.   Also, the patient has history of PreDiabetes and has had no symptoms of reactive hypoglycemia, diabetic polys, paresthesias or visual blurring.  Last A1c was  5.1% on 08/02/2014.   Further, the patient also has history of Vitamin D Deficiency of "11" in 2009  and supplements vitamin D without any suspected side-effects. Last vitamin D was still very low at 36 on 08/02/2014.  Medication Sig  . CALCIUM + D  Take 1 tablet by mouth daily.   Marland Kitchen estradiol (ESTRACE) 1 MG tablet Take 1 tablet (1 mg total) by mouth daily.  Marland Kitchen losartan  50 MG tablet 1/2-1 pill daily for blood pressure (Patient taking differently: Take 25 mg by mouth at bedtime. )  . OCUVITE-LUTEIN Take 1 capsule by mouth daily.  Marland Kitchen omeprazole  20 MG capsule Take 20 mg by mouth daily.  . Probiotic Product  Take 1 capsule by mouth daily.  . quinapril (ACCUPRIL) 10 MG tablet Take 1 tablet (10 mg total) by mouth daily.  . simvastatin (ZOCOR) 20 MG tablet Take 1 tablet (20 mg total) by mouth at bedtime.  . ALPRAZolam (XANAX) 0.5 MG tablet TAKE ONE TABLET BY MOUTH TWICE DAILY AS NEEDED  . baclofen (LIORESAL) 10 MG tablet Take 1 tablet (10 mg total) by mouth 3 (three) times daily.  . mupirocin ointment (BACTROBAN) 2 % Place 1 application into the nose 2 (two) times daily.  Marland Kitchen PERCOCET/ROXICET) 5-325  Take 1-2 tablets by mouth  every 4 (four) hours as needed for severe pain.  Marland Kitchen terbinafine (LAMISIL) 250 MG tablet Take 1 tablet (250 mg total) by mouth daily.  . traMADol (ULTRAM) 50 MG tablet Take 1-2 tablets (50-100 mg total) by mouth every 6 (six) hours as needed   Allergies  Allergen Reactions  . Codeine Other (See Comments)    hallucinations  . Lactose Intolerance (Gi) Diarrhea  . Tomato Diarrhea  . Wheat Bran Diarrhea  . Morphine And Related Other (See Comments)    Unknown-family history of allergy  . Tape Itching and Rash    PMHx:   Past Medical History  Diagnosis Date  . Multiple food allergies   . Hyperlipidemia   . Hypertension   . GERD (gastroesophageal reflux disease)   . IBS (irritable bowel syndrome)   . Unspecified vitamin D deficiency   . Osteopenia   . Arthritis     "hands" (09/17/2013)   Immunization History  Administered Date(s) Administered  . PPD Test 04/17/2013  . Td 11/02/2005   Past Surgical History  Procedure Laterality Date  . Bladder suspension  05/2009  . Cholecystectomy  2000's  . Vaginal hysterectomy  ~ 1977  . Cataract extraction w/ intraocular lens  implant, bilateral Bilateral 2000's  . Anterior and posterior repair  05/2009   FHx:    Reviewed / unchanged  SHx:    Reviewed / unchanged  Systems  Review:  Constitutional: Denies fever, chills, wt changes, headaches, insomnia, fatigue, night sweats, change in appetite. Eyes: Denies redness, blurred vision, diplopia, discharge, itchy, watery eyes.  ENT: Denies discharge, congestion, post nasal drip, epistaxis, sore throat, earache, hearing loss, dental pain, tinnitus, vertigo, sinus pain, snoring.  CV: Denies chest pain, palpitations, irregular heartbeat, syncope, dyspnea, diaphoresis, orthopnea, PND, claudication or edema. Respiratory: denies cough, dyspnea, DOE, pleurisy, hoarseness, laryngitis, wheezing.  Gastrointestinal: Denies dysphagia, odynophagia, heartburn, reflux, water brash, abdominal pain or cramps,  nausea, vomiting, bloating, diarrhea, constipation, hematemesis, melena, hematochezia  or hemorrhoids. Genitourinary: Denies dysuria, frequency, urgency, nocturia, hesitancy, discharge, hematuria or flank pain. Musculoskeletal: Denies arthralgias, myalgias, stiffness, jt. swelling, pain, limping or strain/sprain. Denies falls.  Skin: Denies pruritus, rash, hives, warts, acne, eczema or change in skin lesion(s). Neuro: No weakness, tremor, incoordination, spasms, paresthesia or pain. Psychiatric: Denies confusion, memory loss or sensory loss. Denies Depression.  Endo: Denies change in weight, skin or hair change.  Heme/Lymph: No excessive bleeding, bruising or enlarged lymph nodes.  Physical Exam  BP 106/56 mmHg  Pulse 60  Temp(Src) 97.7 F (36.5 C)  Resp 16  Ht 5' 2.5" (1.588 m)  Wt 130 lb 12.8 oz (59.33 kg)  BMI 23.53 kg/m2  Appears well nourished and in no distress. Eyes: PERRLA, EOMs, conjunctiva no swelling or erythema. Sinuses: No frontal/maxillary tenderness ENT/Mouth: EAC's clear, TM's nl w/o erythema, bulging. Nares clear w/o erythema, swelling, exudates. Oropharynx clear without erythema or exudates. Oral hygiene is good. Tongue normal, non obstructing. Hearing intact.  Neck: Supple. Thyroid nl. Car 2+/2+ without bruits, nodes or JVD. Chest:  Moderate kyphosis. Respirations nl with BS clear & equal w/o rales, rhonchi, wheezing or stridor.  Cor: Heart sounds normal w/ regular rate and rhythm without sig. murmurs, gallops, clicks, or rubs. Peripheral pulses normal and equal  without edema.  Abdomen: Soft & bowel sounds normal. Non-tender w/o guarding, rebound, hernias, masses, or organomegaly.  Lymphatics: Unremarkable.  Musculoskeletal: Full ROM all peripheral extremities, joint stability, 5/5 strength, and normal gait.  Skin: Warm, dry without exposed rashes, lesions or ecchymosis apparent.  Neuro: Cranial nerves intact, reflexes equal bilaterally. Sensory-motor testing  grossly intact. Tendon reflexes grossly intact.  Pysch: Alert & oriented x 3.  Insight and judgement nl & appropriate. No ideations.  Assessment and Plan:  1. Essential hypertension  - TSH  2. Mixed hyperlipidemia  - Lipid panel  3. Prediabetes  - Hemoglobin A1c - Insulin, random  4. Vitamin D deficiency  - Vit D  25 hydroxy   5. Body mass index (BMI) of 23.54 in adult   6. Medication management  - CBC with Differential/Platelet - BASIC METABOLIC PANEL WITH GFR - Hepatic function panel - Magnesium   Recommended regular exercise, BP monitoring, weight control, and discussed med and SE's. Recommended labs to assess and monitor clinical status. Further disposition pending results of labs. Over 30 minutes of exam, counseling, chart review was performed

## 2015-03-15 LAB — TSH: TSH: 3.026 u[IU]/mL (ref 0.350–4.500)

## 2015-03-15 LAB — VITAMIN D 25 HYDROXY (VIT D DEFICIENCY, FRACTURES): VIT D 25 HYDROXY: 45 ng/mL (ref 30–100)

## 2015-03-15 LAB — INSULIN, RANDOM: Insulin: 17.1 u[IU]/mL (ref 2.0–19.6)

## 2015-05-09 ENCOUNTER — Encounter: Payer: Self-pay | Admitting: Internal Medicine

## 2015-05-09 ENCOUNTER — Ambulatory Visit (INDEPENDENT_AMBULATORY_CARE_PROVIDER_SITE_OTHER): Payer: Commercial Managed Care - HMO | Admitting: Internal Medicine

## 2015-05-09 ENCOUNTER — Ambulatory Visit: Payer: Self-pay | Admitting: Internal Medicine

## 2015-05-09 VITALS — BP 130/70 | HR 60 | Temp 97.7°F | Resp 16 | Ht 62.5 in | Wt 128.8 lb

## 2015-05-09 DIAGNOSIS — J069 Acute upper respiratory infection, unspecified: Secondary | ICD-10-CM | POA: Diagnosis not present

## 2015-05-09 MED ORDER — FLUTICASONE PROPIONATE 50 MCG/ACT NA SUSP: 2.0000 | Freq: Every day | NASAL | Status: AC

## 2015-05-09 MED ORDER — PROMETHAZINE-DM 6.25-15 MG/5ML PO SYRP: ORAL_SOLUTION | ORAL | Status: DC

## 2015-05-09 MED ORDER — BENZONATATE 200 MG PO CAPS: 200.0000 mg | ORAL_CAPSULE | Freq: Three times a day (TID) | ORAL | Status: DC | PRN

## 2015-05-09 MED ORDER — PREDNISONE 20 MG PO TABS: ORAL_TABLET | ORAL | Status: DC

## 2015-05-09 NOTE — Progress Notes (Signed)
Patient ID: Ashley Mayer, female   DOB: 03/06/1941, 74 y.o.   MRN: [id number]  HPI  Patient presents to the office for evaluation of cough.  It has been going on for 1 weeks.  Patient reports all the time, dry, worse with lying down.  They also endorse change in voice, postnasal drip and clear rhinorrhea, sneezing, sore throat..  They have tried no OTC meds other than using Ayr saline.  They report that nothing has worked.  They denies other sick contacts.  Review of Systems  Constitutional: Negative for fever and chills.  HENT: Positive for congestion and sore throat. Negative for ear pain.   Respiratory: Positive for cough. Negative for shortness of breath and wheezing.   Cardiovascular: Negative for chest pain, palpitations and leg swelling.  Neurological: Negative for headaches.    PE:  Filed Vitals:   05/09/15 1015  BP: 130/70  Pulse: 60  Temp: 97.7 F (36.5 C)  Resp: 16   General:  Alert and non-toxic, WDWN, NAD HEENT: NCAT, PERLA, EOM normal, no occular discharge or erythema.  Nasal mucosal edema with sinus tenderness to palpation.  Oropharynx clear with minimal oropharyngeal edema and erythema.  Mucous membranes moist and pink. Neck:  Cervical adenopathy Chest:  RRR no MRGs.  Lungs clear to auscultation A&P with no wheezes rhonchi or rales.   Abdomen: +BS x 4 quadrants, soft, non-tender, no guarding, rigidity, or rebound. Skin: warm and dry no rash Neuro: A&Ox4, CN II-XII grossly intact  Assessment and Plan:   1. Acute URI -nasal saline -OTC claritin - predniSONE (DELTASONE) 20 MG tablet; 3 tabs po day one, then 2 tabs daily x 4 days  Dispense: 11 tablet; Refill: 0 - benzonatate (TESSALON) 200 MG capsule; Take 1 capsule (200 mg total) by mouth 3 (three) times daily as needed for cough.  Dispense: 30 capsule; Refill: 1 - promethazine-dextromethorphan (PROMETHAZINE-DM) 6.25-15 MG/5ML syrup; Please take 5 mL as needed for severe coughing at bedtime.  Dispense: 360 mL;  Refill: 1 - fluticasone (FLONASE) 50 MCG/ACT nasal spray; Place 2 sprays into both nostrils daily.  Dispense: 16 g; Refill: 0

## 2015-05-09 NOTE — Patient Instructions (Signed)
Please take the prednisone until it is gone.  Please take tylenol or ibuprofen as needed for sore throat.  Please take claritin daily until your congestion is gone.    Please use 2 sprays per nostril nightly right before bedtime.  Spray to the crown or your head or out towards your ears.  Please take tessalon for cough during the day.  Please take the phenergan syrup at night for severe cough.  Please drink mint tea with honey for soothing sore throat.

## 2015-06-19 ENCOUNTER — Ambulatory Visit (INDEPENDENT_AMBULATORY_CARE_PROVIDER_SITE_OTHER): Payer: Commercial Managed Care - HMO | Admitting: Physician Assistant

## 2015-06-19 ENCOUNTER — Encounter: Payer: Self-pay | Admitting: Physician Assistant

## 2015-06-19 VITALS — BP 110/60 | HR 68 | Temp 97.9°F | Resp 14 | Ht 62.5 in | Wt 122.0 lb

## 2015-06-19 DIAGNOSIS — M8580 Other specified disorders of bone density and structure, unspecified site: Secondary | ICD-10-CM | POA: Insufficient documentation

## 2015-06-19 DIAGNOSIS — Z0001 Encounter for general adult medical examination with abnormal findings: Secondary | ICD-10-CM | POA: Diagnosis not present

## 2015-06-19 DIAGNOSIS — R6889 Other general symptoms and signs: Secondary | ICD-10-CM | POA: Diagnosis not present

## 2015-06-19 DIAGNOSIS — I10 Essential (primary) hypertension: Secondary | ICD-10-CM

## 2015-06-19 DIAGNOSIS — R7309 Other abnormal glucose: Secondary | ICD-10-CM

## 2015-06-19 DIAGNOSIS — E782 Mixed hyperlipidemia: Secondary | ICD-10-CM

## 2015-06-19 DIAGNOSIS — E559 Vitamin D deficiency, unspecified: Secondary | ICD-10-CM

## 2015-06-19 DIAGNOSIS — Z0000 Encounter for general adult medical examination without abnormal findings: Secondary | ICD-10-CM

## 2015-06-19 DIAGNOSIS — K589 Irritable bowel syndrome without diarrhea: Secondary | ICD-10-CM

## 2015-06-19 DIAGNOSIS — Z79899 Other long term (current) drug therapy: Secondary | ICD-10-CM | POA: Diagnosis not present

## 2015-06-19 DIAGNOSIS — K5730 Diverticulosis of large intestine without perforation or abscess without bleeding: Secondary | ICD-10-CM

## 2015-06-19 DIAGNOSIS — S069X9D Unspecified intracranial injury with loss of consciousness of unspecified duration, subsequent encounter: Secondary | ICD-10-CM

## 2015-06-19 LAB — CBC WITH DIFFERENTIAL/PLATELET
Basophils Absolute: 0 10*3/uL (ref 0.0–0.1)
Basophils Relative: 0 % (ref 0–1)
EOS PCT: 2 % (ref 0–5)
Eosinophils Absolute: 0.1 10*3/uL (ref 0.0–0.7)
HEMATOCRIT: 39.1 % (ref 36.0–46.0)
HEMOGLOBIN: 12.8 g/dL (ref 12.0–15.0)
LYMPHS ABS: 1.2 10*3/uL (ref 0.7–4.0)
LYMPHS PCT: 21 % (ref 12–46)
MCH: 32.2 pg (ref 26.0–34.0)
MCHC: 32.7 g/dL (ref 30.0–36.0)
MCV: 98.5 fL (ref 78.0–100.0)
MONO ABS: 0.6 10*3/uL (ref 0.1–1.0)
MONOS PCT: 10 % (ref 3–12)
MPV: 10.3 fL (ref 8.6–12.4)
NEUTROS ABS: 3.8 10*3/uL (ref 1.7–7.7)
Neutrophils Relative %: 67 % (ref 43–77)
Platelets: 301 10*3/uL (ref 150–400)
RBC: 3.97 MIL/uL (ref 3.87–5.11)
RDW: 13.1 % (ref 11.5–15.5)
WBC: 5.6 10*3/uL (ref 4.0–10.5)

## 2015-06-19 LAB — HEPATIC FUNCTION PANEL
ALBUMIN: 3.9 g/dL (ref 3.6–5.1)
ALT: 15 U/L (ref 6–29)
AST: 16 U/L (ref 10–35)
Alkaline Phosphatase: 53 U/L (ref 33–130)
BILIRUBIN INDIRECT: 0.3 mg/dL (ref 0.2–1.2)
Bilirubin, Direct: 0.1 mg/dL (ref <=0.2)
TOTAL PROTEIN: 6.1 g/dL (ref 6.1–8.1)
Total Bilirubin: 0.4 mg/dL (ref 0.2–1.2)

## 2015-06-19 LAB — BASIC METABOLIC PANEL WITH GFR
BUN: 14 mg/dL (ref 7–25)
CALCIUM: 9.1 mg/dL (ref 8.6–10.4)
CO2: 27 mmol/L (ref 20–31)
Chloride: 107 mmol/L (ref 98–110)
Creat: 0.77 mg/dL (ref 0.60–0.93)
GFR, EST AFRICAN AMERICAN: 87 mL/min (ref >=60)
GFR, Est Non African American: 76 mL/min (ref >=60)
GLUCOSE: 65 mg/dL (ref 65–99)
Potassium: 4.2 mmol/L (ref 3.5–5.3)
Sodium: 140 mmol/L (ref 135–146)

## 2015-06-19 LAB — MAGNESIUM: MAGNESIUM: 1.8 mg/dL (ref 1.5–2.5)

## 2015-06-19 LAB — LIPID PANEL
CHOLESTEROL: 185 mg/dL (ref 125–200)
HDL: 60 mg/dL (ref >=46)
LDL CALC: 97 mg/dL (ref <130)
Total CHOL/HDL Ratio: 3.1 Ratio (ref <=5.0)
Triglycerides: 139 mg/dL (ref <150)
VLDL: 28 mg/dL (ref <30)

## 2015-06-19 LAB — TSH: TSH: 2.92 u[IU]/mL (ref 0.350–4.500)

## 2015-06-19 NOTE — Patient Instructions (Addendum)
[employer] with no obligation # [phone] Do not have to be a member Tues-Sat 10-6  [place]- free test with no obligation # [phone] MUST BE A MEMBER Call for store hours  Can try gasx or  can try benefiber  Benefiber is good for constipation/diarrhea/irritable bowel syndrome, it helps with weight loss and can help lower your bad cholesterol. Please do 1-2 TBSP in the morning in water, coffee, or tea. It can take up to a month before you can see a difference with your bowel movements. It is cheapest from [employer], [employer], [employer].    10 Tips on Belching, Bloating, and Flatulence 1. Belching is caused by swallowed air from:  Eating or drinking too fast  Poorly fitting dentures; not chewing food completely  Carbonated beverages  Chewing gum or sucking on hard candies  Excessive swallowing due to nervous tension or postnasal drip  Forced belching to relieve abdominal discomfort 2. To prevent excessive belching, avoid:  Carbonated beverages  Chewing gum  Hard candies  Simethicone/GasX may be helpful  3. Abdominal bloating and discomfort may be due to intestinal sensitivity or symptoms of irritable bowel syndrome. To relieve symptoms, avoid:  Broccoli  Baked beans  Cabbage  Carbonated drinks  Cauliflower  Chewing gum  Hard candy 4. Abdominal distention resulting from weak abdominal muscles:  Is better in the morning  Gets worse as the day progresses  Is relieved by lying down 5. To prevent Abdominal distention:  Tighten abdominal muscles by pulling in your stomach several times during the day  Do sit-up exercises if possible  Wear an abdominal support garment if exercise is too difficult 6. Flatulence is gas created through bacterial action in the bowel and passed rectally. Keep in mind that:  10-18 passages per day are normal  Primary gases are harmless and odorless  Noticeable smells are trace gases related to food intake 7. Foods that  are likely to form gas include:  Milk, dairy products, and medications that contain lactose--If your body doesn't produce the enzyme (lactase) to break it down.  Certain vegetables--baked beans, cauliflower, broccoli, cabbage  Certain starches--wheat, oats, corn, potatoes. Rice is a good substitute. 8. Identify offending foods. Reduce or eliminate these gas-forming foods from your diet.  Preventive Care for Adults A healthy lifestyle and preventive care can promote health and wellness. Preventive health guidelines for women include the following key practices.  A routine yearly physical is a good way to check with your health care provider about your health and preventive screening. It is a chance to share any concerns and updates on your health and to receive a thorough exam.  Visit your dentist for a routine exam and preventive care every 6 months. Brush your teeth twice a day and floss once a day. Good oral hygiene prevents tooth decay and gum disease.  The frequency of eye exams is based on your age, health, family medical history, use of contact lenses, and other factors. Follow your health care provider's recommendations for frequency of eye exams.  Eat a healthy diet. Foods like vegetables, fruits, whole grains, low-fat dairy products, and lean protein foods contain the nutrients you need without too many calories. Decrease your intake of foods high in solid fats, added sugars, and salt. Eat the right amount of calories for you.Get information about a proper diet from your health care provider, if necessary.  Regular physical exercise is one of the most important things you can do for  your health. Most adults should get at least 150 minutes of moderate-intensity exercise (any activity that increases your heart rate and causes you to sweat) each week. In addition, most adults need muscle-strengthening exercises on 2 or more days a week.  Maintain a healthy weight. The body mass index (BMI)  is a screening tool to identify possible weight problems. It provides an estimate of body fat based on height and weight. Your health care provider can find your BMI and can help you achieve or maintain a healthy weight.For adults 20 years and older:  A BMI below 18.5 is considered underweight.  A BMI of 18.5 to 24.9 is normal.  A BMI of 25 to 29.9 is considered overweight.  A BMI of 30 and above is considered obese.  Maintain normal blood lipids and cholesterol levels by exercising and minimizing your intake of saturated fat. Eat a balanced diet with plenty of fruit and vegetables. If your lipid or cholesterol levels are high, you are over 50, or you are at high risk for heart disease, you may need your cholesterol levels checked more frequently.Ongoing high lipid and cholesterol levels should be treated with medicines if diet and exercise are not working.  If you smoke, find out from your health care provider how to quit. If you do not use tobacco, do not start.  Lung cancer screening is recommended for adults aged 52-80 years who are at high risk for developing lung cancer because of a history of smoking. A yearly low-dose CT scan of the lungs is recommended for people who have at least a 30-pack-year history of smoking and are a current smoker or have quit within the past 15 years. A pack year of smoking is smoking an average of 1 pack of cigarettes a day for 1 year (for example: 1 pack a day for 30 years or 2 packs a day for 15 years). Yearly screening should continue until the smoker has stopped smoking for at least 15 years. Yearly screening should be stopped for people who develop a health problem that would prevent them from having lung cancer treatment.  Avoid use of street drugs. Do not share needles with anyone. Ask for help if you need support or instructions about stopping the use of drugs.  High blood pressure causes heart disease and increases the risk of stroke.  Ongoing high  blood pressure should be treated with medicines if weight loss and exercise do not work.  If you are 6-56 years old, ask your health care provider if you should take aspirin to prevent strokes.  Diabetes screening involves taking a blood sample to check your fasting blood sugar level. This should be done once every 3 years, after age 36, if you are within normal weight and without risk factors for diabetes. Testing should be considered at a younger age or be carried out more frequently if you are overweight and have at least 1 risk factor for diabetes.  Breast cancer screening is essential preventive care for women. You should practice "breast self-awareness." This means understanding the normal appearance and feel of your breasts and may include breast self-examination. Any changes detected, no matter how small, should be reported to a health care provider. Women in their 70s and 30s should have a clinical breast exam (CBE) by a health care provider as part of a regular health exam every 1 to 3 years. After age 67, women should have a CBE every year. Starting at age 61, women should consider having  a mammogram (breast X-ray test) every year. Women who have a family history of breast cancer should talk to their health care provider about genetic screening. Women at a high risk of breast cancer should talk to their health care providers about having an MRI and a mammogram every year.  Breast cancer gene (BRCA)-related cancer risk assessment is recommended for women who have family members with BRCA-related cancers. BRCA-related cancers include breast, ovarian, tubal, and peritoneal cancers. Having family members with these cancers may be associated with an increased risk for harmful changes (mutations) in the breast cancer genes BRCA1 and BRCA2. Results of the assessment will determine the need for genetic counseling and BRCA1 and BRCA2 testing.  Routine pelvic exams to screen for cancer are no longer  recommended for nonpregnant women who are considered low risk for cancer of the pelvic organs (ovaries, uterus, and vagina) and who do not have symptoms. Ask your health care provider if a screening pelvic exam is right for you.  If you have had past treatment for cervical cancer or a condition that could lead to cancer, you need Pap tests and screening for cancer for at least 20 years after your treatment. If Pap tests have been discontinued, your risk factors (such as having a new sexual partner) need to be reassessed to determine if screening should be resumed. Some women have medical problems that increase the chance of getting cervical cancer. In these cases, your health care provider may recommend more frequent screening and Pap tests.    Colorectal cancer can be detected and often prevented. Most routine colorectal cancer screening begins at the age of 44 years and continues through age 52 years. However, your health care provider may recommend screening at an earlier age if you have risk factors for colon cancer. On a yearly basis, your health care provider may provide home test kits to check for hidden blood in the stool. Use of a small camera at the end of a tube, to directly examine the colon (sigmoidoscopy or colonoscopy), can detect the earliest forms of colorectal cancer. Talk to your health care provider about this at age 34, when routine screening begins. Direct exam of the colon should be repeated every 5-10 years through age 27 years, unless early forms of pre-cancerous polyps or small growths are found.  Osteoporosis is a disease in which the bones lose minerals and strength with aging. This can result in serious bone fractures or breaks. The risk of osteoporosis can be identified using a bone density scan. Women ages 59 years and over and women at risk for fractures or osteoporosis should discuss screening with their health care providers. Ask your health care provider whether you should  take a calcium supplement or vitamin D to reduce the rate of osteoporosis.  Menopause can be associated with physical symptoms and risks. Hormone replacement therapy is available to decrease symptoms and risks. You should talk to your health care provider about whether hormone replacement therapy is right for you.  Use sunscreen. Apply sunscreen liberally and repeatedly throughout the day. You should seek shade when your shadow is shorter than you. Protect yourself by wearing long sleeves, pants, a wide-brimmed hat, and sunglasses year round, whenever you are outdoors.  Once a month, do a whole body skin exam, using a mirror to look at the skin on your back. Tell your health care provider of new moles, moles that have irregular borders, moles that are larger than a pencil eraser, or moles that have  changed in shape or color.  Stay current with required vaccines (immunizations).  Influenza vaccine. All adults should be immunized every year.  Tetanus, diphtheria, and acellular pertussis (Td, Tdap) vaccine. Pregnant women should receive 1 dose of Tdap vaccine during each pregnancy. The dose should be obtained regardless of the length of time since the last dose. Immunization is preferred during the 27th-36th week of gestation. An adult who has not previously received Tdap or who does not know her vaccine status should receive 1 dose of Tdap. This initial dose should be followed by tetanus and diphtheria toxoids (Td) booster doses every 10 years. Adults with an unknown or incomplete history of completing a 3-dose immunization series with Td-containing vaccines should begin or complete a primary immunization series including a Tdap dose. Adults should receive a Td booster every 10 years.    Zoster vaccine. One dose is recommended for adults aged 65 years or older unless certain conditions are present.    Pneumococcal 13-valent conjugate (PCV13) vaccine. When indicated, a person who is uncertain of her  immunization history and has no record of immunization should receive the PCV13 vaccine. An adult aged 47 years or older who has certain medical conditions and has not been previously immunized should receive 1 dose of PCV13 vaccine. This PCV13 should be followed with a dose of pneumococcal polysaccharide (PPSV23) vaccine. The PPSV23 vaccine dose should be obtained at least 8 weeks after the dose of PCV13 vaccine. An adult aged 58 years or older who has certain medical conditions and previously received 1 or more doses of PPSV23 vaccine should receive 1 dose of PCV13. The PCV13 vaccine dose should be obtained 1 or more years after the last PPSV23 vaccine dose.    Pneumococcal polysaccharide (PPSV23) vaccine. When PCV13 is also indicated, PCV13 should be obtained first. All adults aged 24 years and older should be immunized. An adult younger than age 67 years who has certain medical conditions should be immunized. Any person who resides in a nursing home or long-term care facility should be immunized. An adult smoker should be immunized. People with an immunocompromised condition and certain other conditions should receive both PCV13 and PPSV23 vaccines. People with human immunodeficiency virus (HIV) infection should be immunized as soon as possible after diagnosis. Immunization during chemotherapy or radiation therapy should be avoided. Routine use of PPSV23 vaccine is not recommended for American Indians, [address], or people younger than 65 years unless there are medical conditions that require PPSV23 vaccine. When indicated, people who have unknown immunization and have no record of immunization should receive PPSV23 vaccine. One-time revaccination 5 years after the first dose of PPSV23 is recommended for people aged 19-64 years who have chronic kidney failure, nephrotic syndrome, asplenia, or immunocompromised conditions. People who received 1-2 doses of PPSV23 before age 8 years should receive another  dose of PPSV23 vaccine at age 27 years or later if at least 5 years have passed since the previous dose. Doses of PPSV23 are not needed for people immunized with PPSV23 at or after age 67 years.   Preventive Services / Frequency  Ages 65 years and over  Blood pressure check.  Lipid and cholesterol check.  Lung cancer screening. / Every year if you are aged 55-80 years and have a 30-pack-year history of smoking and currently smoke or have quit within the past 15 years. Yearly screening is stopped once you have quit smoking for at least 15 years or develop a health problem that would prevent you  from having lung cancer treatment.  Clinical breast exam.** / Every year after age 43 years.  BRCA-related cancer risk assessment.** / For women who have family members with a BRCA-related cancer (breast, ovarian, tubal, or peritoneal cancers).  Mammogram.** / Every year beginning at age 45 years and continuing for as long as you are in good health. Consult with your health care provider.  Pap test.** / Every 3 years starting at age 37 years through age 41 or 78 years with 3 consecutive normal Pap tests. Testing can be stopped between 65 and 70 years with 3 consecutive normal Pap tests and no abnormal Pap or HPV tests in the past 10 years.  Fecal occult blood test (FOBT) of stool. / Every year beginning at age 45 years and continuing until age 18 years. You may not need to do this test if you get a colonoscopy every 10 years.  Flexible sigmoidoscopy or colonoscopy.** / Every 5 years for a flexible sigmoidoscopy or every 10 years for a colonoscopy beginning at age 49 years and continuing until age 55 years.  Hepatitis C blood test.** / For all people born from [birth year] through 1966 and any individual with known risks for hepatitis C.  Osteoporosis screening.** / A one-time screening for women ages 87 years and over and women at risk for fractures or osteoporosis.  Skin self-exam. /  Monthly.  Influenza vaccine. / Every year.  Tetanus, diphtheria, and acellular pertussis (Tdap/Td) vaccine.** / 1 dose of Td every 10 years.  Zoster vaccine.** / 1 dose for adults aged 59 years or older.  Pneumococcal 13-valent conjugate (PCV13) vaccine.** / Consult your health care provider.  Pneumococcal polysaccharide (PPSV23) vaccine.** / 1 dose for all adults aged 68 years and older. Screening for abdominal aortic aneurysm (AAA)  by ultrasound is recommended for people who have history of high blood pressure or who are current or former smokers.

## 2015-06-19 NOTE — Progress Notes (Signed)
MEDICARE ANNUAL WELLNESS VISIT AND FOLLOW UP  Assessment:   1. Essential hypertension - continue medications, DASH diet, exercise and monitor at home. Call if greater than 130/80.  - CBC with Differential/Platelet - BASIC METABOLIC PANEL WITH GFR - Hepatic function panel - TSH  2. Other abnormal glucose - Hemoglobin A1c  3. Mixed hyperlipidemia -continue medications, check lipids, decrease fatty foods, increase activity.  - Lipid panel  4. TBI (traumatic brain injury), with loss of consciousness of unspecified duration, subsequent encounter ([place]) No sequela  5. Vitamin D deficiency - VITAMIN D 25 Hydroxy (Vit-D Deficiency, Fractures)  6. Medication management - Magnesium  7. Medicare annual wellness visit, initial  8. IBS (irritable bowel syndrome) Diet controlled  9. Diverticulosis of large intestine without hemorrhage Diet controlled, add fiber  10. Osteopenia Due 2 years, continue vitamin D   Plan:   During the course of the visit the patient was educated and counseled about appropriate screening and preventive services including:    Pneumococcal vaccine   Influenza vaccine  Td vaccine  Screening electrocardiogram  Screening mammography  Bone densitometry screening  Colorectal cancer screening  Diabetes screening  Glaucoma screening  Nutrition counseling   Advanced directives: in hospital chart.   Conditions/risks identified: BMI: Discussed weight loss, diet, and increase physical activity.  Increase physical activity: AHA recommends 150 minutes of physical activity a week.  Medications reviewed Urinary Incontinence is not an issue: discussed non pharmacology and pharmacology options.  Fall risk: low- discussed PT, home fall assessment, medications.    Subjective:   Ashley Mayer is a 75 y.o. female who presents for [employer] Annual Wellness Visit and 3 month follow up on hypertension, hyperlipidemia, vitamin D def.  Date of last  medicare wellness visit was 11/2013.  Her blood pressure has been controlled at home, on 1/2 of losartan, today their BP is BP: 110/60 mmHg She does workout, constantly out in her yard and very active. She denies chest pain, shortness of breath, dizziness.  She is on cholesterol medication and denies myalgias. Her cholesterol is at goal. The cholesterol last visit was:   Lab Results  Component Value Date   CHOL 179 03/14/2015   HDL 51 03/14/2015   LDLCALC 97 03/14/2015   TRIG 156* 03/14/2015   CHOLHDL 3.5 03/14/2015   Last A1C in the office was:  Lab Results  Component Value Date   HGBA1C 5.5 03/14/2015   Patient is on Vitamin D supplement. She is on estrace, s/p hysterectomy, she is on bASA.  She takes prilosec for GERD She is on xanax as needed for anxiety.   Names of Other Physician/Practitioners you currently use: 1. [facility]- here for primary care 2. [facility], last visit 1 year, due to go back 3. , dentist, last visit 2 years Patient Care Team: Unk Pinto, MD as PCP - General (Internal Medicine) Simona Huh, MD as Consulting Physician (Dermatology) Melina Schools, MD as Consulting Physician (Obstetrics and Gynecology) Lafayette Dragon, MD as Consulting Physician (Gastroenterology)   Medication Review Current Outpatient Prescriptions on File Prior to Visit  Medication Sig Dispense Refill  . ALPRAZolam (XANAX) 0.5 MG tablet Take 1/2 to 1 tablet 3 x day if needed for anxiety or sleep 90 tablet 5  . Calcium Carbonate-Vitamin D (CALCIUM + D PO) Take 1 tablet by mouth daily.     Marland Kitchen estradiol (ESTRACE) 1 MG tablet Take 1 tablet (1 mg total) by mouth  daily. 90 tablet 4  . fluticasone (FLONASE) 50 MCG/ACT nasal spray Place 2 sprays into both nostrils daily. 16 g 0  . multivitamin-lutein (OCUVITE-LUTEIN) CAPS Take 1 capsule by mouth daily.    Marland Kitchen omeprazole (PRILOSEC) 20 MG capsule Take 20 mg by mouth  daily.    . simvastatin (ZOCOR) 20 MG tablet Take 1 tablet (20 mg total) by mouth at bedtime. 90 tablet 4  . losartan (COZAAR) 50 MG tablet 1/2-1 pill daily for blood pressure (Patient taking differently: Take 25 mg by mouth at bedtime. ) 90 tablet 4   No current facility-administered medications on file prior to visit.    Current Problems (verified) Patient Active Problem List   Diagnosis Date Noted  . Osteopenia 06/19/2015  . Medicare annual wellness visit, initial 03/14/2015  . TBI (traumatic brain injury) ([place]) 10/11/2014  . Vitamin D deficiency 08/15/2013  . Other abnormal glucose 08/15/2013  . Medication management 08/15/2013  . Mixed hyperlipidemia 12/21/2007  . Essential hypertension 12/21/2007  . Diverticulosis of large intestine 12/21/2007  . IBS (irritable bowel syndrome) 12/21/2007    Screening Tests Health Maintenance  Topic Date Due  . ZOSTAVAX  03/11/2000  . PNA vac Low Risk Adult (1 of 2 - PCV13) 03/11/2005  . INFLUENZA VACCINE  01/06/2015  . TETANUS/TDAP  11/03/2015  . COLONOSCOPY  12/25/2017  . DEXA SCAN  Completed     Immunization History  Administered Date(s) Administered  . PPD Test 04/17/2013  . Td 11/02/2005    Preventative care: Last colonoscopy: 2009 due 2019 Last mammogram: 04/2014 Last pap smear/pelvic exam: 2013  Declines another DEXA: 04/2014, osteopenia  Prior vaccinations: TD or Tdap: 2007 DUE, DECLINES Influenza: declines Pneumococcal: declines Prevnar 13: declines Shingles/Zostavax: declines  Allergies Allergies  Allergen Reactions  . Codeine Other (See Comments)    hallucinations  . Lactose Intolerance (Gi) Diarrhea  . Tomato Diarrhea  . Wheat Bran Diarrhea  . Morphine And Related Other (See Comments)    Unknown-family history of allergy  . Tape Itching and Rash   Surgical history Past Surgical History  Procedure Laterality Date  . Bladder suspension  05/2009  . Cholecystectomy  2000's  . Vaginal hysterectomy  ~  1977  . Cataract extraction w/ intraocular lens  implant, bilateral Bilateral 2000's  . Anterior and posterior repair  05/2009   Family history Family History  Problem Relation Age of Onset  . Stroke Other   . Hypertension Other   . Hyperlipidemia Other   . Cancer Other   . Asthma Other   . Heart attack Other   . Stroke Mother   . Hyperlipidemia Mother   . Cancer Father     mesothelioma  . Diabetes Sister   . Heart disease Brother   . Hyperlipidemia Brother   . Hypertension Brother   . Stroke Brother   . Hemochromatosis Son   . Cancer Brother     bladder with mets to kidneys   Risk Factors: Osteoporosis: postmenopausal estrogen deficiency and dietary calcium and/or vitamin D deficiency History of fracture in the past year: no  Tobacco Social History  Substance Use Topics  . Smoking status: Never Smoker   . Smokeless tobacco: Never Used  . Alcohol Use: No   She does not smoke.  Patient is not a former smoker. Are there smokers in your home (other than you)?  No  Alcohol Current alcohol use: none  Caffeine Current caffeine use: occ  Exercise Current exercise: gardening, walking and yard work  Nutrition/Diet Current diet: in general, a "healthy" diet    Cardiac risk factors: advanced age (older than 64 for men, 3 for women), dyslipidemia and hypertension.  Depression Screen (Note: if answer to either of the following is "Yes", a more complete depression screening is indicated)   Q1: Over the past two weeks, have you felt down, depressed or hopeless? No  Q2: Over the past two weeks, have you felt little interest or pleasure in doing things? No  Have you lost interest or pleasure in daily life? No  Do you often feel hopeless? No  Do you cry easily over simple problems? No  Activities of Daily Living In your present state of health, do you have any difficulty performing the following activities?:  Driving? No Managing money?  No Feeding yourself?  No Getting from bed to chair? No Climbing a flight of stairs? No Preparing food and eating?: No Bathing or showering? No Getting dressed: No Getting to the toilet? No Using the toilet:No Moving around from place to place: No In the past year have you fallen or had a near fall?:No   Are you sexually active?  No  Do you have more than one partner?  No  Vision Difficulties: No  Hearing Difficulties: No Do you often ask people to speak up or repeat themselves? No Do you experience ringing or noises in your ears? Yes Do you have difficulty understanding soft or whispered voices? No  Cognition  Do you feel that you have a problem with memory?No  Do you often misplace items? No  Do you feel safe at home?  Yes  Advanced directives Does patient have a [place]? Yes Does patient have a Living Will? Yes   Objective:   Blood pressure 110/60, pulse 68, temperature 97.9 F (36.6 C), temperature source Temporal, resp. rate 14, height 5' 2.5" (1.588 m), weight 122 lb (55.339 kg), SpO2 99 %. Body mass index is 21.94 kg/(m^2).  General appearance: alert, no distress, WD/WN,  female Cognitive Testing  Alert? Yes  Normal Appearance?Yes  Oriented to person? Yes  Place? Yes   Time? Yes  Recall of three objects?  Yes  Can perform simple calculations? Yes  Displays appropriate judgment?Yes  Can read the correct time from a watch face?Yes  HEENT: normocephalic, sclerae anicteric, TMs pearly, nares patent, no discharge or erythema, pharynx normal Oral cavity: MMM, no lesions Neck: supple, no lymphadenopathy, no thyromegaly, no masses Heart: RRR, normal S1, S2, no murmurs Lungs: CTA bilaterally, no wheezes, rhonchi, or rales Abdomen: +bs, soft, non tender, non distended, no masses, no hepatomegaly, no splenomegaly Musculoskeletal: nontender, no swelling, no obvious deformity Extremities: no edema, no cyanosis, no clubbing Pulses: 2+ symmetric, upper and lower  extremities, normal cap refill Neurological: alert, oriented x 3, CN2-12 intact, strength normal upper extremities and lower extremities, sensation normal throughout, DTRs 2+ throughout, no cerebellar signs, gait normal Psychiatric: normal affect, behavior normal, pleasant  Breast: defer Gyn: defer Rectal: defer  Medicare Attestation I have personally reviewed: The patient's medical and social history Their use of alcohol, tobacco or illicit drugs Their current medications and supplements The patient's functional ability including ADLs,fall risks, home safety risks, cognitive, and hearing and visual impairment Diet and physical activities Evidence for depression or mood disorders  The patient's weight, height, BMI, and visual acuity have been recorded in the chart.  I have made referrals, counseling, and provided education to the patient based on review of the above and I have  provided the patient with a written personalized care plan for preventive services.     Vicie Mutters, PA-C   06/19/2015

## 2015-06-20 LAB — HEMOGLOBIN A1C
Hgb A1c MFr Bld: 5.5 % (ref <5.7)
MEAN PLASMA GLUCOSE: 111 mg/dL (ref <117)

## 2015-06-20 LAB — VITAMIN D 25 HYDROXY (VIT D DEFICIENCY, FRACTURES): Vit D, 25-Hydroxy: 53 ng/mL (ref 30–100)

## 2015-06-23 ENCOUNTER — Encounter: Payer: Self-pay | Admitting: Internal Medicine

## 2015-08-05 ENCOUNTER — Other Ambulatory Visit: Payer: Self-pay | Admitting: Physician Assistant

## 2015-08-05 MED ORDER — PREDNISONE 20 MG PO TABS: ORAL_TABLET | ORAL | Status: DC

## 2015-08-05 MED ORDER — AZITHROMYCIN 250 MG PO TABS: ORAL_TABLET | ORAL | Status: AC

## 2015-08-18 ENCOUNTER — Encounter: Payer: Self-pay | Admitting: Internal Medicine

## 2015-09-23 ENCOUNTER — Encounter: Payer: Self-pay | Admitting: Internal Medicine

## 2015-09-25 ENCOUNTER — Other Ambulatory Visit: Payer: Self-pay

## 2015-09-25 MED ORDER — SIMVASTATIN 20 MG PO TABS: 20.0000 mg | ORAL_TABLET | Freq: Every day | ORAL | Status: DC

## 2015-10-03 ENCOUNTER — Encounter: Payer: Self-pay | Admitting: Physician Assistant

## 2015-10-23 ENCOUNTER — Encounter: Payer: Self-pay | Admitting: Physician Assistant

## 2015-10-23 ENCOUNTER — Ambulatory Visit (INDEPENDENT_AMBULATORY_CARE_PROVIDER_SITE_OTHER): Payer: Commercial Managed Care - HMO | Admitting: Physician Assistant

## 2015-10-23 VITALS — BP 120/70 | HR 83 | Temp 98.6°F | Resp 16 | Ht 62.0 in | Wt 131.6 lb

## 2015-10-23 DIAGNOSIS — E782 Mixed hyperlipidemia: Secondary | ICD-10-CM

## 2015-10-23 DIAGNOSIS — S069X9D Unspecified intracranial injury with loss of consciousness of unspecified duration, subsequent encounter: Secondary | ICD-10-CM

## 2015-10-23 DIAGNOSIS — Z0001 Encounter for general adult medical examination with abnormal findings: Secondary | ICD-10-CM

## 2015-10-23 DIAGNOSIS — I10 Essential (primary) hypertension: Secondary | ICD-10-CM

## 2015-10-23 DIAGNOSIS — M8580 Other specified disorders of bone density and structure, unspecified site: Secondary | ICD-10-CM | POA: Diagnosis not present

## 2015-10-23 DIAGNOSIS — Z79899 Other long term (current) drug therapy: Secondary | ICD-10-CM

## 2015-10-23 DIAGNOSIS — K589 Irritable bowel syndrome without diarrhea: Secondary | ICD-10-CM

## 2015-10-23 DIAGNOSIS — R6889 Other general symptoms and signs: Secondary | ICD-10-CM | POA: Diagnosis not present

## 2015-10-23 DIAGNOSIS — K5730 Diverticulosis of large intestine without perforation or abscess without bleeding: Secondary | ICD-10-CM | POA: Diagnosis not present

## 2015-10-23 DIAGNOSIS — R7309 Other abnormal glucose: Secondary | ICD-10-CM | POA: Diagnosis not present

## 2015-10-23 DIAGNOSIS — Z136 Encounter for screening for cardiovascular disorders: Secondary | ICD-10-CM | POA: Diagnosis not present

## 2015-10-23 DIAGNOSIS — N951 Menopausal and female climacteric states: Secondary | ICD-10-CM | POA: Diagnosis not present

## 2015-10-23 DIAGNOSIS — E559 Vitamin D deficiency, unspecified: Secondary | ICD-10-CM

## 2015-10-23 DIAGNOSIS — R232 Flushing: Secondary | ICD-10-CM

## 2015-10-23 LAB — CBC WITH DIFFERENTIAL/PLATELET
Basophils Absolute: 61 cells/uL (ref 0–200)
Basophils Relative: 1 %
EOS PCT: 2 %
Eosinophils Absolute: 122 cells/uL (ref 15–500)
HCT: 38.9 % (ref 35.0–45.0)
Hemoglobin: 12.9 g/dL (ref 11.7–15.5)
LYMPHS ABS: 1220 {cells}/uL (ref 850–3900)
LYMPHS PCT: 20 %
MCH: 32.7 pg (ref 27.0–33.0)
MCHC: 33.2 g/dL (ref 32.0–36.0)
MCV: 98.5 fL (ref 80.0–100.0)
MPV: 9.8 fL (ref 7.5–12.5)
Monocytes Absolute: 549 cells/uL (ref 200–950)
Monocytes Relative: 9 %
NEUTROS PCT: 68 %
Neutro Abs: 4148 cells/uL (ref 1500–7800)
PLATELETS: 273 10*3/uL (ref 140–400)
RBC: 3.95 MIL/uL (ref 3.80–5.10)
RDW: 13.2 % (ref 11.0–15.0)
WBC: 6.1 10*3/uL (ref 3.8–10.8)

## 2015-10-23 LAB — HEPATIC FUNCTION PANEL
ALBUMIN: 4.1 g/dL (ref 3.6–5.1)
ALT: 16 U/L (ref 6–29)
AST: 19 U/L (ref 10–35)
Alkaline Phosphatase: 57 U/L (ref 33–130)
BILIRUBIN TOTAL: 0.5 mg/dL (ref 0.2–1.2)
Bilirubin, Direct: 0.1 mg/dL (ref <=0.2)
Indirect Bilirubin: 0.4 mg/dL (ref 0.2–1.2)
Total Protein: 6.3 g/dL (ref 6.1–8.1)

## 2015-10-23 LAB — HEMOGLOBIN A1C
HEMOGLOBIN A1C: 5.2 % (ref <5.7)
MEAN PLASMA GLUCOSE: 103 mg/dL

## 2015-10-23 LAB — LIPID PANEL
Cholesterol: 182 mg/dL (ref 125–200)
HDL: 58 mg/dL (ref >=46)
LDL Cholesterol: 86 mg/dL (ref <130)
Total CHOL/HDL Ratio: 3.1 Ratio (ref <=5.0)
Triglycerides: 192 mg/dL — ABNORMAL HIGH (ref <150)
VLDL: 38 mg/dL — ABNORMAL HIGH (ref <30)

## 2015-10-23 LAB — BASIC METABOLIC PANEL WITH GFR
BUN: 15 mg/dL (ref 7–25)
CALCIUM: 9 mg/dL (ref 8.6–10.4)
CHLORIDE: 103 mmol/L (ref 98–110)
CO2: 28 mmol/L (ref 20–31)
CREATININE: 0.75 mg/dL (ref 0.60–0.93)
GFR, Est Non African American: 78 mL/min (ref >=60)
Glucose, Bld: 68 mg/dL (ref 65–99)
Potassium: 4.1 mmol/L (ref 3.5–5.3)
Sodium: 138 mmol/L (ref 135–146)

## 2015-10-23 LAB — TSH: TSH: 3.96 m[IU]/L

## 2015-10-23 MED ORDER — GABAPENTIN 100 MG PO CAPS: ORAL_CAPSULE | ORAL | Status: DC

## 2015-10-23 MED ORDER — AZITHROMYCIN 250 MG PO TABS: ORAL_TABLET | ORAL | Status: AC

## 2015-10-23 NOTE — Patient Instructions (Addendum)
[employer] with no obligation # [phone] Do not have to be a member Tues-Sat 10-6  Have had patient's get good cheaper hearing aids from [facility] The air version has good reviews.   Will add on gabapentin 100mg  for sleep and hot flashes You can go up to 3 pills or 300mg  at night Take 1-3 hours before bed.   Start to slowly taper off the estrogen after 1 week on the gabapentin If this does not help we can try lexapro for hot flashes, this also helps mood.   [facility] Schedule an appointment by calling [phone].  Encourage you to get the 3D Mammogram  The 3D Mammogram is much more specific and sensitive to pick up breast cancer. For women with fibrocystic breast or lumpy breast it can be hard to determine if it is cancer or not but the 3D mammogram is able to tell this difference which cuts back on unneeded additional tests or scary call backs.   - over 40% increase in detection of breast cancer - over 40% reduction in false positives.  - fewer call backs - reduced anxiety - improved outcomes - [facility]    GETTING OFF OF PPI's    Nexium/protonix/prilosec/Omeprazole/Dexilant/Aciphex are called PPI's, they are great at healing your stomach but should only be taken for a short period of time.     Recent studies have shown that taken for a long time they  can increase the risk of osteoporosis (weakening of your bones), pneumonia, low magnesium, restless legs, Cdiff (infection that causes diarrhea), DEMENTIA and most recently kidney damage / disease / insufficiency.     Due to this information we want to try to stop the PPI but if you try to stop it abruptly this can cause rebound acid and worsening symptoms.   So this is how we want you to get off the PPI:  - Start taking the nexium/protonix/prilosec/PPI  every other day with  zantac (ranitidine) 2 x a day for 2-4 weeks  - then decrease the PPI to every 3 days while taking the zantac  (ranitidine) twice a day the other  days for 2-4  Weeks  - then you can try the zantac (ranitidine) once at night or up to 2 x day as needed.  - you can continue on this once at night or stop all together  - Avoid alcohol, spicy foods, NSAIDS (aleve, ibuprofen) at this time. See foods below.   +++++++++++++++++++++++++++++++++++++++++++  Food Choices for Gastroesophageal Reflux Disease  When you have gastroesophageal reflux disease (GERD), the foods you eat and your eating habits are very important. Choosing the right foods can help ease the discomfort of GERD. WHAT GENERAL GUIDELINES DO I NEED TO FOLLOW?  Choose fruits, vegetables, whole grains, low-fat dairy products, and low-fat meat, fish, and poultry.  Limit fats such as oils, salad dressings, butter, nuts, and avocado.  Keep a food diary to identify foods that cause symptoms.  Avoid foods that cause reflux. These may be different for different people.  Eat frequent small meals instead of three large meals each day.  Eat your meals slowly, in a relaxed setting.  Limit fried foods.  Cook foods using methods other than frying.  Avoid drinking alcohol.  Avoid drinking large amounts of liquids with your meals.  Avoid bending over or lying down until 2-3 hours after eating.   WHAT FOODS ARE NOT RECOMMENDED? The following are some foods and drinks that may worsen your  symptoms:  Vegetables Tomatoes. Tomato juice. Tomato and spaghetti sauce. Chili peppers. Onion and garlic. Horseradish. Fruits Oranges, grapefruit, and lemon (fruit and juice). Meats High-fat meats, fish, and poultry. This includes hot dogs, ribs, ham, sausage, salami, and bacon. Dairy Whole milk and chocolate milk. Sour cream. Cream. Butter. Ice cream. Cream cheese.  Beverages Coffee and tea, with or without caffeine. Carbonated beverages or energy drinks. Condiments Hot sauce. Barbecue sauce.  Sweets/Desserts Chocolate and cocoa. Donuts. Peppermint  and spearmint. Fats and Oils High-fat foods, including [place] fries and potato chips. Other Vinegar. Strong spices, such as black pepper, white pepper, red pepper, cayenne, curry powder, cloves, ginger, and chili powder. Nexium/protonix/prilosec are called PPI's, they are great at healing your stomach but should only be taken for a short period of time.    HOME CARE INSTRUCTIONS   Do not stand or sit in one position for long periods of time. Do not sit with your legs crossed. Rest with your legs raised during the day.  Your legs have to be higher than your heart so that gravity will force the valves to open, so please really elevate your legs.   Wear elastic stockings or support hose. Do not wear other tight, encircling garments around the legs, pelvis, or waist.  [employer]  has a wide variety of well priced compression stockings. [place], [place] [id number] [phone]  Walk as much as possible to increase blood flow.  Raise the foot of your bed at night with 2-inch blocks. SEEK MEDICAL CARE IF:   The skin around your ankle starts to break down.  You have pain, redness, tenderness, or hard swelling developing in your leg over a vein.  You are uncomfortable due to leg pain. Document Released: 03/03/2005 Document Revised: 08/16/2011 Document Reviewed: 07/20/2010 [facility] Patient Information 2014 [employer].

## 2015-10-23 NOTE — Progress Notes (Signed)
CPE AND FOLLOW UP   Assessment:   1. Essential hypertension - continue medications, DASH diet, exercise and monitor at home. Call if greater than 130/80.  - CBC with Differential/Platelet - BASIC METABOLIC PANEL WITH GFR - Hepatic function panel - TSH  2. Other abnormal glucose - Hemoglobin A1c  3. Mixed hyperlipidemia -continue medications, check lipids, decrease fatty foods, increase activity.  - Lipid panel  4. TBI (traumatic brain injury), with loss of consciousness of unspecified duration, subsequent encounter ([place]) Hit head recently, no LOC, normal neuro exam patient to go to ER if there is weakness, thunderclap headache, visual changes, or any concerning factors  5. Vitamin D deficiency - VITAMIN D 25 Hydroxy (Vit-D Deficiency, Fractures)  6. Medication management - Magnesium  7. Medicare annual wellness visit, initial  8. IBS (irritable bowel syndrome) Diet controlled  9. Diverticulosis of large intestine without hemorrhage Diet controlled, add fiber  10. Osteopenia Due 2 years, continue vitamin D  11. Hot flashes Start tapering off estrogen due to age, add gabapentin at night for sleep.hot flashes Get MGM, over due    Subjective:   Ashley Mayer is a 75 y.o. female who presents for CPE and 3 month follow up on hypertension, hyperlipidemia, vitamin D def.   Her blood pressure has been controlled at home, on 1/2 of losartan, today their BP is BP: 120/70 mmHg She does workout, constantly out in her yard and very active. She denies chest pain, shortness of breath, dizziness.  She is on cholesterol medication and denies myalgias. Her cholesterol is at goal. The cholesterol last visit was:   Lab Results  Component Value Date   CHOL 185 06/19/2015   HDL 60 06/19/2015   LDLCALC 97 06/19/2015   TRIG 139 06/19/2015   CHOLHDL 3.1 06/19/2015   Last A1C in the office was:  Lab Results  Component Value Date   HGBA1C 5.5 06/19/2015   Patient is on  Vitamin D supplement. Lab Results  Component Value Date   VD25OH 59 06/19/2015   She is on estrace, s/p hysterectomy, she is on bASA, she has had night sweats, not drenching, x several years, does occ have them in the AM after a shower, have gotten worse.  She tried to taper off her estrogen however had worse hot flashes with this.  She takes prilosec for GERD She is on xanax as needed for anxiety.  For last 2-3 days, has had sinus issues, ears are ringing, left ear feels closed up with decreased hearing, left ear will pop, took sinus pill yesterday and this AM.  Also patient was getting out of SUV, happened 1 week ago yesterday, bent down to miss mirror, hit the top of her head, "saw stars" but denies LOC. Head is still sore.  On low dose aspriin.   Names of Other Physician/Practitioners you currently use: 2. [facility], last visit 1 year, due to go back 3. , dentist, last visit 2 years Patient Care Team: Unk Pinto, MD as PCP - General (Internal Medicine) Simona Huh, MD as Consulting Physician (Dermatology) Melina Schools, MD as Consulting Physician (Obstetrics and Gynecology) Lafayette Dragon, MD as Consulting Physician (Gastroenterology)   Medication Review Current Outpatient Prescriptions on File Prior to Visit  Medication Sig Dispense Refill  . ALPRAZolam (XANAX) 0.5 MG tablet Take 1/2 to 1 tablet 3 x day if needed for anxiety or sleep 90 tablet 5  . aspirin 81 MG tablet Take 81 mg  by mouth daily.    . Calcium Carbonate-Vitamin D (CALCIUM + D PO) Take 1 tablet by mouth daily.     Marland Kitchen estradiol (ESTRACE) 1 MG tablet Take 1 tablet (1 mg total) by mouth daily. 90 tablet 4  . fluticasone (FLONASE) 50 MCG/ACT nasal spray Place 2 sprays into both nostrils daily. 16 g 0  . multivitamin-lutein (OCUVITE-LUTEIN) CAPS Take 1 capsule by mouth daily.    Marland Kitchen omeprazole (PRILOSEC) 20 MG capsule Take 20 mg by mouth daily.    . simvastatin (ZOCOR) 20 MG tablet Take  1 tablet (20 mg total) by mouth at bedtime. 30 tablet 0  . losartan (COZAAR) 50 MG tablet 1/2-1 pill daily for blood pressure (Patient taking differently: Take 25 mg by mouth at bedtime. ) 90 tablet 4   No current facility-administered medications on file prior to visit.    Current Problems (verified) Patient Active Problem List   Diagnosis Date Noted  . Osteopenia 06/19/2015  . Medicare annual wellness visit, initial 03/14/2015  . TBI (traumatic brain injury) ([place]) 10/11/2014  . Vitamin D deficiency 08/15/2013  . Other abnormal glucose 08/15/2013  . Medication management 08/15/2013  . Mixed hyperlipidemia 12/21/2007  . Essential hypertension 12/21/2007  . Diverticulosis of large intestine 12/21/2007  . IBS (irritable bowel syndrome) 12/21/2007    Screening Tests Immunization History  Administered Date(s) Administered  . PPD Test 04/17/2013  . Td 11/02/2005   Preventative care: Last colonoscopy: 2009 due 2019 Last mammogram: 04/2014 Last pap smear/pelvic exam: 2013  Declines another DEXA: 04/2014, osteopenia  Prior vaccinations: TD or Tdap: 2007 DUE, DECLINES Influenza: declines Pneumococcal: declines Prevnar 13: declines Shingles/Zostavax: declines  Allergies Allergies  Allergen Reactions  . Codeine Other (See Comments)    hallucinations  . Lactose Intolerance (Gi) Diarrhea  . Tomato Diarrhea  . Wheat Bran Diarrhea  . Morphine And Related Other (See Comments)    Unknown-family history of allergy  . Tape Itching and Rash   Surgical history Past Surgical History  Procedure Laterality Date  . Bladder suspension  05/2009  . Cholecystectomy  2000's  . Vaginal hysterectomy  ~ 1977  . Cataract extraction w/ intraocular lens  implant, bilateral Bilateral 2000's  . Anterior and posterior repair  05/2009   Family history Family History  Problem Relation Age of Onset  . Stroke Other   . Hypertension Other   . Hyperlipidemia Other   . Cancer Other   . Asthma  Other   . Heart attack Other   . Stroke Mother   . Hyperlipidemia Mother   . Cancer Father     mesothelioma  . Diabetes Sister   . Heart disease Brother   . Hyperlipidemia Brother   . Hypertension Brother   . Stroke Brother   . Hemochromatosis Son   . Cancer Brother     bladder with mets to kidneys   Tobacco Social History  Substance Use Topics  . Smoking status: Never Smoker   . Smokeless tobacco: Never Used  . Alcohol Use: No    Objective:   Blood pressure 120/70, pulse 83, temperature 98.6 F (37 C), temperature source Temporal, resp. rate 16, height 5\' 2"  (1.575 m), weight 131 lb 9.6 oz (59.693 kg), SpO2 96 %. Body mass index is 24.06 kg/(m^2).  General appearance: alert, no distress, WD/WN,  female HEENT: normocephalic, sclerae anicteric, TMs pearly, nares patent, no discharge or erythema, pharynx normal Oral cavity: MMM, no lesions Neck: supple, no lymphadenopathy, no thyromegaly, no masses Heart: RRR,  normal S1, S2, no murmurs Lungs: CTA bilaterally, no wheezes, rhonchi, or rales Abdomen: +bs, soft, non tender, non distended, no masses, no hepatomegaly, no splenomegaly Musculoskeletal: nontender, no swelling, no obvious deformity Extremities: no edema, no cyanosis, no clubbing Pulses: 2+ symmetric, upper and lower extremities, normal cap refill Neurological: alert, oriented x 3, CN2-12 intact, strength normal upper extremities and lower extremities, sensation normal throughout, DTRs 2+ throughout, no cerebellar signs, gait normal Psychiatric: normal affect, behavior normal, pleasant  Breast: defer Gyn: defer Rectal: defer   Vicie Mutters, PA-C   10/23/2015

## 2015-10-24 ENCOUNTER — Other Ambulatory Visit: Payer: Self-pay

## 2015-10-24 LAB — URINALYSIS, ROUTINE W REFLEX MICROSCOPIC
Bilirubin Urine: NEGATIVE
Glucose, UA: NEGATIVE
HGB URINE DIPSTICK: NEGATIVE
KETONES UR: NEGATIVE
Leukocytes, UA: NEGATIVE
NITRITE: NEGATIVE
Protein, ur: NEGATIVE
SPECIFIC GRAVITY, URINE: 1.006 (ref 1.001–1.035)
pH: 7 (ref 5.0–8.0)

## 2015-10-24 LAB — MICROALBUMIN / CREATININE URINE RATIO: Creatinine, Urine: 16 mg/dL — ABNORMAL LOW (ref 20–320)

## 2015-10-24 LAB — INSULIN, FASTING: Insulin fasting, serum: 8.6 u[IU]/mL (ref 2.0–19.6)

## 2015-10-24 MED ORDER — SIMVASTATIN 20 MG PO TABS: 20.0000 mg | ORAL_TABLET | Freq: Every day | ORAL | Status: DC

## 2015-10-24 MED ORDER — LOSARTAN POTASSIUM 50 MG PO TABS: ORAL_TABLET | ORAL | Status: DC

## 2015-11-04 ENCOUNTER — Other Ambulatory Visit: Payer: Self-pay | Admitting: Physician Assistant

## 2015-11-04 MED ORDER — ESTRADIOL 1 MG PO TABS: 1.0000 mg | ORAL_TABLET | Freq: Every day | ORAL | Status: DC

## 2015-11-10 ENCOUNTER — Other Ambulatory Visit: Payer: Self-pay | Admitting: Physician Assistant

## 2015-11-10 DIAGNOSIS — F411 Generalized anxiety disorder: Secondary | ICD-10-CM

## 2015-11-10 MED ORDER — ALPRAZOLAM 0.5 MG PO TABS: ORAL_TABLET | ORAL | Status: DC

## 2015-11-14 DIAGNOSIS — Z1231 Encounter for screening mammogram for malignant neoplasm of breast: Secondary | ICD-10-CM | POA: Diagnosis not present

## 2015-11-18 DIAGNOSIS — N6002 Solitary cyst of left breast: Secondary | ICD-10-CM | POA: Diagnosis not present

## 2016-01-19 ENCOUNTER — Encounter: Payer: Self-pay | Admitting: *Deleted

## 2016-02-23 ENCOUNTER — Other Ambulatory Visit: Payer: Self-pay | Admitting: *Deleted

## 2016-02-23 DIAGNOSIS — I10 Essential (primary) hypertension: Secondary | ICD-10-CM | POA: Diagnosis not present

## 2016-02-23 DIAGNOSIS — H35033 Hypertensive retinopathy, bilateral: Secondary | ICD-10-CM | POA: Diagnosis not present

## 2016-02-23 DIAGNOSIS — Z0100 Encounter for examination of eyes and vision without abnormal findings: Secondary | ICD-10-CM | POA: Diagnosis not present

## 2016-02-23 MED ORDER — SIMVASTATIN 20 MG PO TABS: 20.0000 mg | ORAL_TABLET | Freq: Every day | ORAL | 1 refills | Status: DC
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
## 2016-03-08 ENCOUNTER — Other Ambulatory Visit: Payer: Self-pay | Admitting: Internal Medicine

## 2016-03-08 MED ORDER — ESTRADIOL 1 MG PO TABS: 1.0000 mg | ORAL_TABLET | Freq: Every day | ORAL | 1 refills | Status: DC

## 2016-03-31 DIAGNOSIS — Z0100 Encounter for examination of eyes and vision without abnormal findings: Secondary | ICD-10-CM | POA: Diagnosis not present

## 2016-04-14 ENCOUNTER — Other Ambulatory Visit: Payer: Self-pay | Admitting: Internal Medicine

## 2016-04-14 DIAGNOSIS — E2839 Other primary ovarian failure: Secondary | ICD-10-CM

## 2016-05-07 DIAGNOSIS — M85851 Other specified disorders of bone density and structure, right thigh: Secondary | ICD-10-CM | POA: Diagnosis not present

## 2016-05-17 ENCOUNTER — Other Ambulatory Visit: Payer: Self-pay | Admitting: Physician Assistant

## 2016-05-17 ENCOUNTER — Encounter: Payer: Self-pay | Admitting: Internal Medicine

## 2016-05-17 ENCOUNTER — Ambulatory Visit (INDEPENDENT_AMBULATORY_CARE_PROVIDER_SITE_OTHER): Payer: Commercial Managed Care - HMO | Admitting: Internal Medicine

## 2016-05-17 VITALS — BP 130/68 | HR 60 | Temp 97.0°F | Resp 16 | Ht 62.0 in | Wt 130.4 lb

## 2016-05-17 DIAGNOSIS — E782 Mixed hyperlipidemia: Secondary | ICD-10-CM

## 2016-05-17 DIAGNOSIS — E559 Vitamin D deficiency, unspecified: Secondary | ICD-10-CM | POA: Diagnosis not present

## 2016-05-17 DIAGNOSIS — F411 Generalized anxiety disorder: Secondary | ICD-10-CM

## 2016-05-17 DIAGNOSIS — I10 Essential (primary) hypertension: Secondary | ICD-10-CM | POA: Diagnosis not present

## 2016-05-17 DIAGNOSIS — Z79899 Other long term (current) drug therapy: Secondary | ICD-10-CM | POA: Diagnosis not present

## 2016-05-17 DIAGNOSIS — K219 Gastro-esophageal reflux disease without esophagitis: Secondary | ICD-10-CM | POA: Diagnosis not present

## 2016-05-17 DIAGNOSIS — R7309 Other abnormal glucose: Secondary | ICD-10-CM

## 2016-05-17 LAB — LIPID PANEL
Cholesterol: 168 mg/dL (ref <200)
HDL: 45 mg/dL — AB (ref >50)
LDL CALC: 84 mg/dL (ref <100)
Total CHOL/HDL Ratio: 3.7 Ratio (ref <5.0)
Triglycerides: 196 mg/dL — ABNORMAL HIGH (ref <150)
VLDL: 39 mg/dL — ABNORMAL HIGH (ref <30)

## 2016-05-17 LAB — BASIC METABOLIC PANEL WITH GFR
BUN: 13 mg/dL (ref 7–25)
CHLORIDE: 107 mmol/L (ref 98–110)
CO2: 28 mmol/L (ref 20–31)
Calcium: 9.5 mg/dL (ref 8.6–10.4)
Creat: 0.76 mg/dL (ref 0.60–0.93)
GFR, Est African American: 88 mL/min (ref >=60)
GFR, Est Non African American: 76 mL/min (ref >=60)
GLUCOSE: 81 mg/dL (ref 65–99)
POTASSIUM: 4.3 mmol/L (ref 3.5–5.3)
Sodium: 141 mmol/L (ref 135–146)

## 2016-05-17 LAB — CBC WITH DIFFERENTIAL/PLATELET
BASOS PCT: 1 %
Basophils Absolute: 52 cells/uL (ref 0–200)
EOS ABS: 312 {cells}/uL (ref 15–500)
Eosinophils Relative: 6 %
HEMATOCRIT: 38.2 % (ref 35.0–45.0)
HEMOGLOBIN: 12.6 g/dL (ref 11.7–15.5)
Lymphocytes Relative: 20 %
Lymphs Abs: 1040 cells/uL (ref 850–3900)
MCH: 32.1 pg (ref 27.0–33.0)
MCHC: 33 g/dL (ref 32.0–36.0)
MCV: 97.4 fL (ref 80.0–100.0)
MONO ABS: 572 {cells}/uL (ref 200–950)
MPV: 10.4 fL (ref 7.5–12.5)
Monocytes Relative: 11 %
NEUTROS ABS: 3224 {cells}/uL (ref 1500–7800)
Neutrophils Relative %: 62 %
PLATELETS: 240 10*3/uL (ref 140–400)
RBC: 3.92 MIL/uL (ref 3.80–5.10)
RDW: 13 % (ref 11.0–15.0)
WBC: 5.2 10*3/uL (ref 3.8–10.8)

## 2016-05-17 LAB — HEMOGLOBIN A1C
Hgb A1c MFr Bld: 4.9 % (ref <5.7)
Mean Plasma Glucose: 94 mg/dL

## 2016-05-17 LAB — HEPATIC FUNCTION PANEL
ALBUMIN: 4.1 g/dL (ref 3.6–5.1)
ALK PHOS: 58 U/L (ref 33–130)
ALT: 16 U/L (ref 6–29)
AST: 18 U/L (ref 10–35)
BILIRUBIN TOTAL: 0.4 mg/dL (ref 0.2–1.2)
Bilirubin, Direct: 0.1 mg/dL (ref <=0.2)
Indirect Bilirubin: 0.3 mg/dL (ref 0.2–1.2)
Total Protein: 6.1 g/dL (ref 6.1–8.1)

## 2016-05-17 LAB — TSH: TSH: 3.74 m[IU]/L

## 2016-05-17 NOTE — Telephone Encounter (Signed)
Please call Alprazolam 

## 2016-05-17 NOTE — Progress Notes (Signed)
[facility] Unk Pinto, M.D.        Ashley Mayer. Silverio Lay, P.A.-C       Starlyn Skeans, P.A.-C  [facility]                [address] [place], [place] [phone] Telephone [phone] Telefax [phone] ______________________________________________________________________     This very nice 75 y.o. WWF presents for  follow up with Hypertension, Hyperlipidemia, Pre-Diabetes and Vitamin D Deficiency.  Patient has hx/o GERD controlled with prudent diet.     Patient is treated for HTN & BP has been controlled at home. Today's BP is at goal - 130/68. Patient has had no complaints of any cardiac type chest pain, palpitations, dyspnea/orthopnea/PND, dizziness, claudication, or dependent edema.     Hyperlipidemia is controlled with diet & meds. Patient denies myalgias or other med SE's. Last Lipids were at goal albeit slightly elevated Trig's: Lab Results  Component Value Date   CHOL 182 10/23/2015   HDL 58 10/23/2015   LDLCALC 86 10/23/2015   TRIG 192 (H) 10/23/2015   CHOLHDL 3.1 10/23/2015      Also, the patient has history of PreDiabetes and has had no symptoms of reactive hypoglycemia, diabetic polys, paresthesias or visual blurring.  Last A1c was at goal: Lab Results  Component Value Date   HGBA1C 5.2 10/23/2015      Further, the patient also has history of Vitamin D Deficiency in 2009 of "11"  and supplements vitamin D without any suspected side-effects. Last vitamin D was near goal:  Lab Results  Component Value Date   VD25OH 40 06/19/2015   Current Outpatient Prescriptions on File Prior to Visit  Medication Sig  . aspirin 81 MG  Take 81 mg by mouth daily.  Marland Kitchen CALCIUM + D  Take 1 tablet by mouth daily.   Marland Kitchen estradiol  1 MG tablet Take 1 tablet (1 mg total) by mouth daily.  Marland Kitchen FLONASE nasal spray Place 2 sprays into both nostrils daily.  Marland Kitchen losartan  50 MG 1/2-1 pill daily for blood pressure   . OCUVITE-LUTEIN  Take 1 capsule by mouth daily.  . Simvastatin 20 MG tablet Take 1 tablet (20 mg total) by mouth at bedtime.    Allergies  Allergen Reactions  . Codeine Other (See Comments)    hallucinations  . Lactose Intolerance (Gi) Diarrhea  . Tomato Diarrhea  . Wheat Bran Diarrhea  . Morphine And Related Other (See Comments)    Unknown-family history of allergy  . Tape Itching and Rash   PMHx:   Past Medical History:  Diagnosis Date  . Arthritis    "hands" (09/17/2013)  . GERD (gastroesophageal reflux disease)   . Hyperlipidemia   . Hypertension   . IBS (irritable bowel syndrome)   . Multiple food allergies   . Osteopenia   . Unspecified vitamin D deficiency    Immunization History  Administered Date(s) Administered  . PPD Test 04/17/2013  . Td 11/02/2005   Past Surgical History:  Procedure Laterality Date  . ANTERIOR AND POSTERIOR REPAIR  05/2009  . BLADDER SUSPENSION  05/2009  . CATARACT EXTRACTION W/ INTRAOCULAR LENS  IMPLANT, BILATERAL Bilateral 2000's  . CHOLECYSTECTOMY  2000's  . VAGINAL HYSTERECTOMY  ~ 1977   FHx:    Reviewed / unchanged  SHx:    Reviewed / unchanged  Systems Review:  Constitutional:  Denies fever, chills, wt changes, headaches, insomnia, fatigue, night sweats, change in appetite. Eyes: Denies redness, blurred vision, diplopia, discharge, itchy, watery eyes.  ENT: Denies discharge, congestion, post nasal drip, epistaxis, sore throat, earache, hearing loss, dental pain, tinnitus, vertigo, sinus pain, snoring.  CV: Denies chest pain, palpitations, irregular heartbeat, syncope, dyspnea, diaphoresis, orthopnea, PND, claudication or edema. Respiratory: denies cough, dyspnea, DOE, pleurisy, hoarseness, laryngitis, wheezing.  Gastrointestinal: Denies dysphagia, odynophagia, heartburn, reflux, water brash, abdominal pain or cramps, nausea, vomiting, bloating, diarrhea, constipation, hematemesis, melena, hematochezia  or  hemorrhoids. Genitourinary: Denies dysuria, frequency, urgency, nocturia, hesitancy, discharge, hematuria or flank pain. Musculoskeletal: Denies arthralgias, myalgias, stiffness, jt. swelling, pain, limping or strain/sprain.  Skin: Denies pruritus, rash, hives, warts, acne, eczema or change in skin lesion(s). Neuro: No weakness, tremor, incoordination, spasms, paresthesia or pain. Psychiatric: Denies confusion, memory loss or sensory loss. Endo: Denies change in weight, skin or hair change.  Heme/Lymph: No excessive bleeding, bruising or enlarged lymph nodes.  Physical Exam BP 130/68   Pulse 60   Temp 97 F (36.1 C)   Resp 16   Ht 5\' 2"  (1.575 m)   Wt 130 lb 6.4 oz (59.1 kg)   BMI 23.85 kg/m   Appears well nourished and in no distress.  Eyes: PERRLA, EOMs, conjunctiva no swelling or erythema. Sinuses: No frontal/maxillary tenderness ENT/Mouth: EAC's clear, TM's nl w/o erythema, bulging. Nares clear w/o erythema, swelling, exudates. Oropharynx clear without erythema or exudates. Oral hygiene is good. Tongue normal, non obstructing. Hearing intact.  Neck: Supple. Thyroid nl. Car 2+/2+ without bruits, nodes or JVD. Chest: Respirations nl with BS clear & equal w/o rales, rhonchi, wheezing or stridor.  Cor: Heart sounds normal w/ regular rate and rhythm without sig. murmurs, gallops, clicks, or rubs. Peripheral pulses normal and equal  without edema.  Abdomen: Soft & bowel sounds normal. Non-tender w/o guarding, rebound, hernias, masses, or organomegaly.  Lymphatics: Unremarkable.  Musculoskeletal: Full ROM all peripheral extremities, joint stability, 5/5 strength, and normal gait.  Skin: Warm, dry without exposed rashes, lesions or ecchymosis apparent.  Neuro: Cranial nerves intact, reflexes equal bilaterally. Sensory-motor testing grossly intact. Tendon reflexes grossly intact.  Pysch: Alert & oriented x 3.  Insight and judgement nl & appropriate. No ideations.  Assessment and  Plan:   1. Essential hypertension  - Continue medication, monitor blood pressure at home.  - Continue DASH diet. Reminder to go to the ER if any CP,  SOB, nausea, dizziness, severe HA, changes vision/speech,  left arm numbness and tingling and jaw pain. - CBC with Differential/Platelet - BASIC METABOLIC PANEL WITH GFR - TSH  2. Mixed hyperlipidemia  - Continue diet/meds, exercise,& lifestyle modifications.  - Continue monitor periodic cholesterol/liver & renal functions  - Hepatic function panel - Lipid panel - TSH  3. Other abnormal glucose  - Continue diet, exercise, lifestyle modifications.  - Monitor appropriate labs. - Hemoglobin A1c - Insulin, random  4. Vitamin D deficiency  - Continue supplementation. - VITAMIN D 25 Hydroxy   5. Gastroesophageal reflux disease   6. Medication management   - CBC with Differential/Platelet - BASIC METABOLIC PANEL WITH GFR - Magnesium      Recommended regular exercise, BP monitoring, weight control, and discussed med and SE's. Recommended labs to assess and monitor clinical status. Further disposition pending results of labs. Over 30 minutes of exam, counseling, chart review was performed

## 2016-05-17 NOTE — Patient Instructions (Signed)
[facility] Unk Pinto, M.D.        Uvaldo Bristle. Silverio Lay, P.A.-C       Starlyn Skeans, P.A.-C  [facility]                [address] [place], [place] [id number] Telephone [phone] Telefax [phone] ______________________________________________________________________     This very nice 75 y.o.female presents for 3 month follow up with Hypertension, Hyperlipidemia, Pre-Diabetes and Vitamin D Deficiency.      Patient is treated for HTN & BP has been controlled at home. Today's  . Patient has had no complaints of any cardiac type chest pain, palpitations, dyspnea/orthopnea/PND, dizziness, claudication, or dependent edema.     Hyperlipidemia is controlled with diet & meds. Patient denies myalgias or other med SE's. 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  Last A1c was  Lab Results  Component Value Date   HGBA1C 5.2 10/23/2015      Further, the patient also has history of Vitamin D Deficiency and supplements vitamin D without any suspected side-effects. Last vitamin D was   Lab Results  Component Value Date   VD25OH 16 06/19/2015   Current Outpatient Prescriptions on File Prior to Visit  Medication Sig  . ALPRAZolam (XANAX) 0.5 MG tablet Take 1/2 to 1 tablet 3 x day if needed for anxiety or sleep  . aspirin 81 MG tablet Take 81 mg by mouth daily.  . Calcium Carbonate-Vitamin D (CALCIUM + D PO) Take 1 tablet by mouth daily.   Marland Kitchen estradiol (ESTRACE) 1 MG tablet Take 1 tablet (1 mg total) by mouth daily.  . fluticasone (FLONASE) 50 MCG/ACT nasal spray Place 2 sprays into both nostrils daily.  Marland Kitchen gabapentin  (NEURONTIN) 100 MG capsule 1-3 capsules at night 1-3 hours before bed  . losartan (COZAAR) 50 MG tablet 1/2-1 pill daily for blood pressure  . multivitamin-lutein (OCUVITE-LUTEIN) CAPS Take 1 capsule by mouth daily.  Marland Kitchen omeprazole (PRILOSEC) 20 MG capsule Take 20 mg by mouth daily.  . simvastatin (ZOCOR) 20 MG tablet Take 1 tablet (20 mg total) by mouth at bedtime.   No current facility-administered medications on file prior to visit.    Allergies  Allergen Reactions  . Codeine Other (See Comments)    hallucinations  . Lactose Intolerance (Gi) Diarrhea  . Tomato Diarrhea  . Wheat Bran Diarrhea  . Morphine And Related Other (See Comments)    Unknown-family history of allergy  . Tape Itching and Rash   PMHx:   Past Medical History:  Diagnosis Date  . Arthritis    "hands" (09/17/2013)  . GERD (gastroesophageal reflux disease)   . Hyperlipidemia   . Hypertension   . IBS (irritable bowel syndrome)   . Multiple food allergies   . Osteopenia   . Unspecified vitamin D deficiency    Immunization History  Administered Date(s) Administered  . PPD Test 04/17/2013  . Td 11/02/2005   Past Surgical History:  Procedure Laterality Date  . ANTERIOR AND POSTERIOR REPAIR  05/2009  . BLADDER SUSPENSION  05/2009  .  CATARACT EXTRACTION W/ INTRAOCULAR LENS  IMPLANT, BILATERAL Bilateral 2000's  . CHOLECYSTECTOMY  2000's  . VAGINAL HYSTERECTOMY  ~ 1977   FHx:    Reviewed / unchanged  SHx:    Reviewed / unchanged  Systems Review:  Constitutional: Denies fever, chills, wt changes, headaches, insomnia, fatigue, night sweats, change in appetite. Eyes: Denies redness, blurred vision, diplopia, discharge, itchy, watery eyes.  ENT: Denies discharge, congestion, post nasal drip, epistaxis, sore throat, earache, hearing loss, dental pain, tinnitus, vertigo, sinus pain, snoring.  CV: Denies chest pain, palpitations, irregular heartbeat, syncope, dyspnea, diaphoresis, orthopnea, PND, claudication or  edema. Respiratory: denies cough, dyspnea, DOE, pleurisy, hoarseness, laryngitis, wheezing.  Gastrointestinal: Denies dysphagia, odynophagia, heartburn, reflux, water brash, abdominal pain or cramps, nausea, vomiting, bloating, diarrhea, constipation, hematemesis, melena, hematochezia  or hemorrhoids. Genitourinary: Denies dysuria, frequency, urgency, nocturia, hesitancy, discharge, hematuria or flank pain. Musculoskeletal: Denies arthralgias, myalgias, stiffness, jt. swelling, pain, limping or strain/sprain.  Skin: Denies pruritus, rash, hives, warts, acne, eczema or change in skin lesion(s). Neuro: No weakness, tremor, incoordination, spasms, paresthesia or pain. Psychiatric: Denies confusion, memory loss or sensory loss. Endo: Denies change in weight, skin or hair change.  Heme/Lymph: No excessive bleeding, bruising or enlarged lymph nodes.  Physical Exam There were no vitals taken for this visit.  Appears well nourished and in no distress.  Eyes: PERRLA, EOMs, conjunctiva no swelling or erythema. Sinuses: No frontal/maxillary tenderness ENT/Mouth: EAC's clear, TM's nl w/o erythema, bulging. Nares clear w/o erythema, swelling, exudates. Oropharynx clear without erythema or exudates. Oral hygiene is good. Tongue normal, non obstructing. Hearing intact.  Neck: Supple. Thyroid nl. Car 2+/2+ without bruits, nodes or JVD. Chest: Respirations nl with BS clear & equal w/o rales, rhonchi, wheezing or stridor.  Cor: Heart sounds normal w/ regular rate and rhythm without sig. murmurs, gallops, clicks, or rubs. Peripheral pulses normal and equal  without edema.  Abdomen: Soft & bowel sounds normal. Non-tender w/o guarding, rebound, hernias, masses, or organomegaly.  Lymphatics: Unremarkable.  Musculoskeletal: Full ROM all peripheral extremities, joint stability, 5/5 strength, and normal gait.  Skin: Warm, dry without exposed rashes, lesions or ecchymosis apparent.  Neuro: Cranial nerves intact,  reflexes equal bilaterally. Sensory-motor testing grossly intact. Tendon reflexes grossly intact.  Pysch: Alert & oriented x 3.  Insight and judgement nl & appropriate. No ideations.  Assessment and Plan:  - Continue medication, monitor blood pressure at home. Continue DASH diet. Reminder to go to the ER if any CP, SOB, nausea, dizziness, severe HA, changes vision/speech, left arm numbness and tingling and jaw pain. - Continue diet/meds, exercise,& lifestyle modifications. Continue monitor periodic cholesterol/liver & renal functions   - Continue diet, exercise, lifestyle modifications. Monitor appropriate labs. - Continue supplementation.      Recommended regular exercise, BP monitoring, weight control, and discussed med and SE's. Recommended labs to assess and monitor clinical status. Further disposition pending results of labs. Over 30 minutes of exam, counseling, chart review was performed

## 2016-05-18 ENCOUNTER — Other Ambulatory Visit: Payer: Self-pay | Admitting: Physician Assistant

## 2016-05-18 DIAGNOSIS — F411 Generalized anxiety disorder: Secondary | ICD-10-CM

## 2016-05-18 LAB — VITAMIN D 25 HYDROXY (VIT D DEFICIENCY, FRACTURES): VIT D 25 HYDROXY: 50 ng/mL (ref 30–100)

## 2016-05-18 LAB — INSULIN, RANDOM: Insulin: 13.6 u[IU]/mL (ref 2.0–19.6)

## 2016-05-18 LAB — MAGNESIUM: Magnesium: 1.8 mg/dL (ref 1.5–2.5)

## 2016-06-10 DIAGNOSIS — I83893 Varicose veins of bilateral lower extremities with other complications: Secondary | ICD-10-CM | POA: Diagnosis not present

## 2016-06-10 DIAGNOSIS — I83813 Varicose veins of bilateral lower extremities with pain: Secondary | ICD-10-CM | POA: Diagnosis not present

## 2016-06-11 ENCOUNTER — Encounter: Payer: Self-pay | Admitting: *Deleted

## 2016-07-02 ENCOUNTER — Other Ambulatory Visit: Payer: Self-pay | Admitting: Internal Medicine

## 2016-07-02 MED ORDER — CIPROFLOXACIN HCL 500 MG PO TABS: ORAL_TABLET | ORAL | 0 refills | Status: AC

## 2016-07-08 DIAGNOSIS — I83813 Varicose veins of bilateral lower extremities with pain: Secondary | ICD-10-CM | POA: Diagnosis not present

## 2016-07-20 DIAGNOSIS — I83893 Varicose veins of bilateral lower extremities with other complications: Secondary | ICD-10-CM | POA: Diagnosis not present

## 2016-07-20 DIAGNOSIS — I83813 Varicose veins of bilateral lower extremities with pain: Secondary | ICD-10-CM | POA: Diagnosis not present

## 2016-08-19 ENCOUNTER — Encounter: Payer: Self-pay | Admitting: Internal Medicine

## 2016-08-19 ENCOUNTER — Ambulatory Visit (INDEPENDENT_AMBULATORY_CARE_PROVIDER_SITE_OTHER): Payer: Medicare HMO | Admitting: Internal Medicine

## 2016-08-19 VITALS — BP 126/60 | HR 56 | Temp 98.0°F | Resp 16 | Ht 62.0 in | Wt 130.0 lb

## 2016-08-19 DIAGNOSIS — R7309 Other abnormal glucose: Secondary | ICD-10-CM | POA: Diagnosis not present

## 2016-08-19 DIAGNOSIS — Z79899 Other long term (current) drug therapy: Secondary | ICD-10-CM

## 2016-08-19 DIAGNOSIS — K589 Irritable bowel syndrome without diarrhea: Secondary | ICD-10-CM

## 2016-08-19 DIAGNOSIS — M8580 Other specified disorders of bone density and structure, unspecified site: Secondary | ICD-10-CM | POA: Diagnosis not present

## 2016-08-19 DIAGNOSIS — E559 Vitamin D deficiency, unspecified: Secondary | ICD-10-CM

## 2016-08-19 DIAGNOSIS — R7303 Prediabetes: Secondary | ICD-10-CM

## 2016-08-19 DIAGNOSIS — Z0001 Encounter for general adult medical examination with abnormal findings: Secondary | ICD-10-CM

## 2016-08-19 DIAGNOSIS — E782 Mixed hyperlipidemia: Secondary | ICD-10-CM

## 2016-08-19 DIAGNOSIS — R6889 Other general symptoms and signs: Secondary | ICD-10-CM | POA: Diagnosis not present

## 2016-08-19 DIAGNOSIS — I10 Essential (primary) hypertension: Secondary | ICD-10-CM | POA: Diagnosis not present

## 2016-08-19 DIAGNOSIS — K5730 Diverticulosis of large intestine without perforation or abscess without bleeding: Secondary | ICD-10-CM

## 2016-08-19 DIAGNOSIS — Z0000 Encounter for general adult medical examination without abnormal findings: Secondary | ICD-10-CM

## 2016-08-19 DIAGNOSIS — S069X9D Unspecified intracranial injury with loss of consciousness of unspecified duration, subsequent encounter: Secondary | ICD-10-CM

## 2016-08-19 LAB — HEPATIC FUNCTION PANEL
ALT: 15 U/L (ref 6–29)
AST: 18 U/L (ref 10–35)
Albumin: 4.1 g/dL (ref 3.6–5.1)
Alkaline Phosphatase: 72 U/L (ref 33–130)
BILIRUBIN DIRECT: 0.1 mg/dL (ref <=0.2)
Indirect Bilirubin: 0.5 mg/dL (ref 0.2–1.2)
TOTAL PROTEIN: 6.3 g/dL (ref 6.1–8.1)
Total Bilirubin: 0.6 mg/dL (ref 0.2–1.2)

## 2016-08-19 LAB — CBC WITH DIFFERENTIAL/PLATELET
BASOS ABS: 62 {cells}/uL (ref 0–200)
Basophils Relative: 1 %
EOS ABS: 558 {cells}/uL — AB (ref 15–500)
EOS PCT: 9 %
HCT: 39 % (ref 35.0–45.0)
HEMOGLOBIN: 12.8 g/dL (ref 11.7–15.5)
LYMPHS ABS: 1302 {cells}/uL (ref 850–3900)
Lymphocytes Relative: 21 %
MCH: 31.8 pg (ref 27.0–33.0)
MCHC: 32.8 g/dL (ref 32.0–36.0)
MCV: 97 fL (ref 80.0–100.0)
MPV: 10 fL (ref 7.5–12.5)
Monocytes Absolute: 434 cells/uL (ref 200–950)
Monocytes Relative: 7 %
NEUTROS ABS: 3844 {cells}/uL (ref 1500–7800)
NEUTROS PCT: 62 %
Platelets: 269 10*3/uL (ref 140–400)
RBC: 4.02 MIL/uL (ref 3.80–5.10)
RDW: 12.9 % (ref 11.0–15.0)
WBC: 6.2 10*3/uL (ref 3.8–10.8)

## 2016-08-19 LAB — LIPID PANEL
CHOLESTEROL: 184 mg/dL (ref <200)
HDL: 41 mg/dL — ABNORMAL LOW (ref >50)
LDL Cholesterol: 105 mg/dL — ABNORMAL HIGH (ref <100)
TRIGLYCERIDES: 190 mg/dL — AB (ref <150)
Total CHOL/HDL Ratio: 4.5 Ratio (ref <5.0)
VLDL: 38 mg/dL — AB (ref <30)

## 2016-08-19 LAB — BASIC METABOLIC PANEL WITH GFR
BUN: 18 mg/dL (ref 7–25)
CHLORIDE: 103 mmol/L (ref 98–110)
CO2: 28 mmol/L (ref 20–31)
Calcium: 10 mg/dL (ref 8.6–10.4)
Creat: 0.83 mg/dL (ref 0.60–0.93)
GFR, Est African American: 79 mL/min (ref >=60)
GFR, Est Non African American: 69 mL/min (ref >=60)
GLUCOSE: 80 mg/dL (ref 65–99)
Potassium: 4.7 mmol/L (ref 3.5–5.3)
SODIUM: 140 mmol/L (ref 135–146)

## 2016-08-19 LAB — TSH: TSH: 3.11 m[IU]/L

## 2016-08-19 MED ORDER — SIMVASTATIN 20 MG PO TABS: 20.0000 mg | ORAL_TABLET | Freq: Every day | ORAL | 1 refills | Status: DC

## 2016-08-19 NOTE — Progress Notes (Signed)
MEDICARE ANNUAL WELLNESS VISIT AND FOLLOW UP Assessment:    1. Medication management  - CBC with Differential/Platelet - BASIC METABOLIC PANEL WITH GFR - Hepatic function panel  2. Mixed hyperlipidemia -not currently on medication -cont diet and exercise -avoid animal and dairy products -try fiber supplement - Lipid panel  3. Prediabetes -cont diet and exercise -well controlled -check once yearly for screening for diabetes - Hemoglobin A1c  4. Essential hypertension -cont losartan -well controlled -dash diet -exercise as tolerated -monitor at home - TSH  5. Diverticulosis of large intestine without hemorrhage -followed by colonscopy -due next year  6. Irritable bowel syndrome, unspecified type -cont diet and exercise -avoid trigger foods -not currently symptomatic  7. Traumatic brain injury with loss of consciousness, subsequent encounter -historical  8. Osteopenia, unspecified location -will need DEXA  -cont estrogen therapy -cont vit D and calcium supplement  9. Other abnormal glucose -cont diet and exercise  10. Vitamin D deficiency -cont VIt D supplement  11. Medicare annual wellness visit, initial -due next year    Over 30 minutes of exam, counseling, chart review, and critical decision making was performed  Future Appointments Date Time Provider [place]  12/06/2016 10:00 AM Vicie Mutters, PA-C [facility] None     Plan:   During the course of the visit the Ashley Mayer was educated and counseled about appropriate screening and preventive services including:    Pneumococcal vaccine   Influenza vaccine  Prevnar 13  Td vaccine  Screening electrocardiogram  Colorectal cancer screening  Diabetes screening  Glaucoma screening  Nutrition counseling    Subjective:  Ashley Mayer is a 76 y.o. female who presents for Medicare Annual Wellness Visit and 3 month follow up for HTN, hyperlipidemia, prediabetes, and vitamin D  Def.   Ashley Mayer blood pressure has been controlled at home, today their BP is BP: 126/60 Ashley Mayer does not workout. Ashley Mayer denies chest pain, shortness of breath, dizziness.    Ashley Mayer is on cholesterol medication and denies myalgias. Ashley Mayer cholesterol is at goal. The cholesterol last visit was:   Lab Results  Component Value Date   CHOL 184 08/19/2016   HDL 41 (L) 08/19/2016   LDLCALC 105 (H) 08/19/2016   TRIG 190 (H) 08/19/2016   CHOLHDL 4.5 08/19/2016   : Ashley Mayer has a history of well controlled blood sugars.  Ashley Mayer has had prediabetes at one time but was able to control Ashley Mayer A1c with diet alone and is now back in the normal range.   Lab Results  Component Value Date   HGBA1C 4.9 08/19/2016   Last GFR Lab Results  Component Value Date   GFRNONAA 69 08/19/2016     Lab Results  Component Value Date   GFRAA 79 08/19/2016   Ashley Mayer is on Vitamin D supplement.   Lab Results  Component Value Date   VD25OH 23 05/17/2016      Ashley Mayer reports that Ashley Mayer does tend to get allergies and has been having some congestion and runny nose.  Ashley Mayer reports that Ashley Mayer has been using ayr, and is taking an allergy tablet.  Ashley Mayer is not using the flonase.  Ashley Mayer reports that this has been drying Ashley Mayer out.      Medication Review: Current Outpatient Prescriptions on File Prior to Visit  Medication Sig Dispense Refill  . ALPRAZolam (XANAX) 0.5 MG tablet TAKE ONE-HALF TO ONE TABLET BY MOUTH THREE TIMES DAILY AS NEEDED FOR ANXIETY OR  SLEEP 90 tablet 3  . aspirin 81 MG tablet Take 81  mg by mouth daily.    . Calcium Carbonate-Vitamin D (CALCIUM + D PO) Take 1 tablet by mouth daily.     . Calcium-Vitamin D-Vitamin K (VIACTIV) [id number] MG-UNT-MCG CHEW Chew by mouth. Takes 1 gummy daily    . estradiol (ESTRACE) 1 MG tablet Take 1 tablet (1 mg total) by mouth daily. 90 tablet 1  . Flaxseed, Linseed, (FLAXSEED OIL) 1000 MG CAPS Take 1 capsule by mouth daily.    . fluticasone (FLONASE) 50 MCG/ACT nasal spray Place 2 sprays into both  nostrils daily. 16 g 0  . losartan (COZAAR) 50 MG tablet 1/2-1 pill daily for blood pressure 90 tablet 1  . multivitamin-lutein (OCUVITE-LUTEIN) CAPS Take 1 capsule by mouth daily.    Marland Kitchen OVER THE COUNTER MEDICATION Ashley Mayer rotates OTC Nexium ,Tagamet and Prevacid for indigestion.    . Zinc 50 MG TABS Take 1 tablet by mouth daily.     No current facility-administered medications on file prior to visit.     Allergies: Allergies  Allergen Reactions  . Codeine Other (See Comments)    hallucinations  . Lactose Intolerance (Gi) Diarrhea  . Tomato Diarrhea  . Wheat Bran Diarrhea  . Morphine And Related Other (See Comments)    Unknown-family history of allergy  . Tape Itching and Rash    Current Problems (verified) has Mixed hyperlipidemia; Essential hypertension; Diverticulosis of large intestine; IBS (irritable bowel syndrome); Vitamin D deficiency; Other abnormal glucose; Medication management; TBI (traumatic brain injury) ([place]); Medicare annual wellness visit, initial; and Osteopenia on Ashley Mayer problem list.  Screening Tests Immunization History  Administered Date(s) Administered  . PPD Test 04/17/2013  . Td 11/02/2005    Preventative care: Last colonoscopy: 2009 DEXA: 2017 Mammogram: 2017  Prior vaccinations: Declines all vaccinations  Names of Other Physician/Practitioners you currently use: 1. [facility] here for primary care 2. [facility] [place] [facility], eye doctor, last visit 2017 3. Does not see one regularly , dentist, last visit Ashley Mayer Care Team: Unk Pinto, MD as PCP - General (Internal Medicine) Druscilla Brownie, MD as Consulting Physician (Dermatology) Newton Pigg, MD as Consulting Physician (Obstetrics and Gynecology) Lafayette Dragon, MD (Inactive) as Consulting Physician (Gastroenterology) Earlean Polka, MD as Consulting Physician (Ophthalmology)  Surgical: Ashley Mayer  has a past surgical history that includes Bladder  suspension (05/2009); Cholecystectomy (2000's); Vaginal hysterectomy (~ 1977); Cataract extraction w/ intraocular lens  implant, bilateral (Bilateral, 2000's); and Anterior and posterior repair (05/2009). Family Ashley Mayer family history includes Asthma in Ashley Mayer other; Cancer in Ashley Mayer brother, father, and other; Diabetes in Ashley Mayer sister; Heart attack in Ashley Mayer other; Heart disease in Ashley Mayer brother; Hemochromatosis in Ashley Mayer son; Hyperlipidemia in Ashley Mayer brother, mother, and other; Hypertension in Ashley Mayer brother and other; Stroke in Ashley Mayer brother, mother, and other. Social history  Ashley Mayer reports that Ashley Mayer has never smoked. Ashley Mayer has never used smokeless tobacco. Ashley Mayer reports that Ashley Mayer does not drink alcohol or use drugs.  MEDICARE WELLNESS OBJECTIVES: Physical activity: Current Exercise Habits: Home exercise routine, Type of exercise: walking, Time (Minutes): 45, Frequency (Times/Week): 5, Weekly Exercise (Minutes/Week): 225, Intensity: Mild Cardiac risk factors: Cardiac Risk Factors include: advanced age (>37men, >67 women);diabetes mellitus;hypertension Depression/mood screen:   Depression screen [facility] 2/9 08/19/2016  Decreased Interest 0  Down, Depressed, Hopeless 0  PHQ - 2 Score 0    ADLs:  In your present state of health, do you have any difficulty performing the following activities: 08/19/2016 05/17/2016  Hearing? N N  Vision? N N  Difficulty concentrating or making decisions? N N  Walking or climbing stairs? N N  Dressing or bathing? N N  Doing errands, shopping? N N  Preparing Food and eating ? N -  Using the Toilet? N -  In the past six months, have you accidently leaked urine? N -  Do you have problems with loss of bowel control? N -  Managing your Medications? N -  Managing your Finances? N -  Housekeeping or managing your Housekeeping? N -  Some recent data might be hidden     Cognitive Testing  Alert? Yes  Normal Appearance?Yes  Oriented to person? Yes  Place? Yes   Time? Yes  Recall of three objects?   Yes  Can perform simple calculations? Yes  Displays appropriate judgment?Yes  Can read the correct time from a watch face?Yes  EOL planning: Does Ashley Mayer Have a Medical Advance Directive?: Yes Type of Advance Directive: Living will Would Ashley Mayer like information on creating a medical advance directive?: Yes (MAU/Ambulatory/Procedural Areas - Information given)   Objective:   Today's Vitals   08/19/16 0935  BP: 126/60  Pulse: (!) 56  Resp: 16  Temp: 98 F (36.7 C)  TempSrc: Temporal  Weight: 130 lb (59 kg)  Height: 5\' 2"  (1.575 m)   Body mass index is 23.78 kg/m.  General appearance: alert, no distress, WD/WN, female HEENT: normocephalic, sclerae anicteric, TMs pearly, nares patent, no discharge or erythema, pharynx normal Oral cavity: MMM, no lesions Neck: supple, no lymphadenopathy, no thyromegaly, no masses Heart: RRR, normal S1, S2, no murmurs Lungs: CTA bilaterally, no wheezes, rhonchi, or rales Abdomen: +bs, soft, non tender, non distended, no masses, no hepatomegaly, no splenomegaly Musculoskeletal: nontender, no swelling, no obvious deformity Extremities: no edema, no cyanosis, no clubbing Pulses: 2+ symmetric, upper and lower extremities, normal cap refill Neurological: alert, oriented x 3, CN2-12 intact, strength normal upper extremities and lower extremities, sensation normal throughout, DTRs 2+ throughout, no cerebellar signs, gait normal Psychiatric: normal affect, behavior normal, pleasant   Medicare Attestation I have personally reviewed: The Ashley Mayer's medical and social history Their use of alcohol, tobacco or illicit drugs Their current medications and supplements The Ashley Mayer's functional ability including ADLs,fall risks, home safety risks, cognitive, and hearing and visual impairment Diet and physical activities Evidence for depression or mood disorders  The Ashley Mayer's weight, height, BMI, and visual acuity have been recorded in the chart.  I have made  referrals, counseling, and provided education to the Ashley Mayer based on review of the above and I have provided the Ashley Mayer with a written personalized care plan for preventive services.     Starlyn Skeans, PA-C   08/22/2016

## 2016-08-20 LAB — HEMOGLOBIN A1C
Hgb A1c MFr Bld: 4.9 % (ref <5.7)
MEAN PLASMA GLUCOSE: 94 mg/dL

## 2016-09-02 DIAGNOSIS — I8311 Varicose veins of right lower extremity with inflammation: Secondary | ICD-10-CM | POA: Diagnosis not present

## 2016-09-02 DIAGNOSIS — I83891 Varicose veins of right lower extremities with other complications: Secondary | ICD-10-CM | POA: Diagnosis not present

## 2016-09-20 DIAGNOSIS — I8311 Varicose veins of right lower extremity with inflammation: Secondary | ICD-10-CM | POA: Diagnosis not present

## 2016-09-20 DIAGNOSIS — I83811 Varicose veins of right lower extremities with pain: Secondary | ICD-10-CM | POA: Diagnosis not present

## 2016-10-11 ENCOUNTER — Encounter: Payer: Self-pay | Admitting: Internal Medicine

## 2016-10-11 DIAGNOSIS — I8312 Varicose veins of left lower extremity with inflammation: Secondary | ICD-10-CM | POA: Diagnosis not present

## 2016-10-11 DIAGNOSIS — I83812 Varicose veins of left lower extremities with pain: Secondary | ICD-10-CM | POA: Diagnosis not present

## 2016-10-11 DIAGNOSIS — I83892 Varicose veins of left lower extremities with other complications: Secondary | ICD-10-CM | POA: Diagnosis not present

## 2016-10-25 DIAGNOSIS — M7981 Nontraumatic hematoma of soft tissue: Secondary | ICD-10-CM | POA: Diagnosis not present

## 2016-10-25 DIAGNOSIS — I8312 Varicose veins of left lower extremity with inflammation: Secondary | ICD-10-CM | POA: Diagnosis not present

## 2016-10-27 ENCOUNTER — Encounter: Payer: Self-pay | Admitting: Physician Assistant

## 2016-11-04 ENCOUNTER — Other Ambulatory Visit: Payer: Self-pay | Admitting: Internal Medicine

## 2016-11-04 DIAGNOSIS — I10 Essential (primary) hypertension: Secondary | ICD-10-CM

## 2016-11-04 MED ORDER — LOSARTAN POTASSIUM 50 MG PO TABS
ORAL_TABLET | ORAL | 1 refills | Status: DC
Start: 2016-11-04 — End: 2017-08-19

## 2016-12-02 NOTE — Progress Notes (Deleted)
CPE AND FOLLOW UP   Assessment:   Essential hypertension - continue medications, DASH diet, exercise and monitor at home. Call if greater than 130/80.  - CBC with Differential/Platelet - BASIC METABOLIC PANEL WITH GFR - Hepatic function panel - TSH   Other abnormal glucose - Hemoglobin A1c   Mixed hyperlipidemia -continue medications, check lipids, decrease fatty foods, increase activity.  - Lipid panel   TBI (traumatic brain injury), with loss of consciousness of unspecified duration, subsequent encounter [facility]) patient to go to ER if there is weakness, thunderclap headache, visual changes, or any concerning factors   Vitamin D deficiency - VITAMIN D 25 Hydroxy (Vit-D Deficiency, Fractures)   Medication management - Magnesium   Medicare annual wellness visit, initial   IBS (irritable bowel syndrome) Diet controlled   Diverticulosis of large intestine without hemorrhage Diet controlled, add fiber   Osteopenia Due 2 years, continue vitamin D  Future Appointments Date Time Provider [place]  12/06/2016 10:00 AM Vicie Mutters, PA-C [facility] None  12/07/2017 10:00 AM Vicie Mutters, PA-C [facility] None     Subjective:   Ashley Mayer is a 76 y.o. female who presents for CPE and 3 month follow up on hypertension, hyperlipidemia, vitamin D def.   Her blood pressure has been controlled at home, on 1/2 of losartan, today their BP is   She does workout, constantly out in her yard and very active. She denies chest pain, shortness of breath, dizziness.  She is on cholesterol medication and denies myalgias. Her cholesterol is at goal. The cholesterol last visit was:   Lab Results  Component Value Date   CHOL 184 08/19/2016   HDL 41 (L) 08/19/2016   LDLCALC 105 (H) 08/19/2016   TRIG 190 (H) 08/19/2016   CHOLHDL 4.5 08/19/2016   Last A1C in the office was:  Lab Results  Component Value Date   HGBA1C 4.9 08/19/2016   Patient is on Vitamin D  supplement. Lab Results  Component Value Date   VD25OH 42 05/17/2016   She is on estrace, s/p hysterectomy, she is on bASA.  She takes prilosec for GERD She is on xanax as needed for anxiety.  BMI is There is no height or weight on file to calculate BMI., she is working on diet and exercise. Wt Readings from Last 3 Encounters:  08/19/16 130 lb (59 kg)  05/17/16 130 lb 6.4 oz (59.1 kg)  10/23/15 131 lb 9.6 oz (59.7 kg)     Names of Other Physician/Practitioners you currently use: 2. [place], last visit 1 [place], due to go back 3. , dentist, last visit 2 years Patient Care Team: Unk Pinto, MD as PCP - General (Internal Medicine) Simona Huh, MD as Consulting Physician (Dermatology) Melina Schools, MD as Consulting Physician (Obstetrics and Gynecology) Lafayette Dragon, MD as Consulting Physician (Gastroenterology)   Medication Review Current Outpatient Prescriptions on File Prior to Visit  Medication Sig Dispense Refill  . ALPRAZolam (XANAX) 0.5 MG tablet TAKE ONE-HALF TO ONE TABLET BY MOUTH THREE TIMES DAILY AS NEEDED FOR ANXIETY OR  SLEEP 90 tablet 3  . aspirin 81 MG tablet Take 81 mg by mouth daily.    . Calcium Carbonate-Vitamin D (CALCIUM + D PO) Take 1 tablet by mouth daily.     . Calcium-Vitamin D-Vitamin K (VIACTIV) 166-063-01 MG-UNT-MCG CHEW Chew by mouth. Takes 1 gummy daily    . estradiol (ESTRACE) 1 MG tablet Take 1 tablet (1 mg total) by  mouth daily. 90 tablet 1  . Flaxseed, Linseed, (FLAXSEED OIL) 1000 MG CAPS Take 1 capsule by mouth daily.    . fluticasone (FLONASE) 50 MCG/ACT nasal spray Place 2 sprays into both nostrils daily. 16 g 0  . losartan (COZAAR) 50 MG tablet 1/2-1 pill daily for blood pressure 90 tablet 1  . multivitamin-lutein (OCUVITE-LUTEIN) CAPS Take 1 capsule by mouth daily.    Marland Kitchen OVER THE COUNTER MEDICATION Patient rotates OTC Nexium ,Tagamet and Prevacid for indigestion.    . simvastatin (ZOCOR) 20 MG tablet Take  1 tablet (20 mg total) by mouth at bedtime. 90 tablet 1  . Zinc 50 MG TABS Take 1 tablet by mouth daily.     No current facility-administered medications on file prior to visit.     Current Problems (verified) Patient Active Problem List   Diagnosis Date Noted  . Osteopenia 06/19/2015  . Medicare annual wellness visit, initial 03/14/2015  . TBI (traumatic brain injury) ([place]) 10/11/2014  . Vitamin D deficiency 08/15/2013  . Other abnormal glucose 08/15/2013  . Medication management 08/15/2013  . Mixed hyperlipidemia 12/21/2007  . Essential hypertension 12/21/2007  . Diverticulosis of large intestine 12/21/2007  . IBS (irritable bowel syndrome) 12/21/2007    Screening Tests Immunization History  Administered Date(s) Administered  . PPD Test 04/17/2013  . Td 11/02/2005   Preventative care: Last colonoscopy: 2009 due 2019 Last mammogram: 04/2014 Last pap smear/pelvic exam: 2013  Declines another DEXA: 04/2014, osteopenia  Prior vaccinations: TD or Tdap: 2007 DUE, DECLINES Influenza: declines Pneumococcal: declines Prevnar 13: declines Shingles/Zostavax: declines  Allergies Allergies  Allergen Reactions  . Codeine Other (See Comments)    hallucinations  . Lactose Intolerance (Gi) Diarrhea  . Tomato Diarrhea  . Wheat Bran Diarrhea  . Morphine And Related Other (See Comments)    Unknown-family history of allergy  . Tape Itching and Rash    SURGICAL HISTORY She  has a past surgical history that includes Bladder suspension (05/2009); Cholecystectomy (2000's); Vaginal hysterectomy (~ 1977); Cataract extraction w/ intraocular lens  implant, bilateral (Bilateral, 2000's); and Anterior and posterior repair (05/2009). FAMILY HISTORY Her family history includes Asthma in her other; Cancer in her brother, father, and other; Diabetes in her sister; Heart attack in her other; Heart disease in her brother; Hemochromatosis in her son; Hyperlipidemia in her brother, mother, and  other; Hypertension in her brother and other; Stroke in her brother, mother, and other. SOCIAL HISTORY She  reports that she has never smoked. She has never used smokeless tobacco. She reports that she does not drink alcohol or use drugs.   Objective:   There were no vitals taken for this visit. There is no height or weight on file to calculate BMI.  General appearance: alert, no distress, WD/WN,  female HEENT: normocephalic, sclerae anicteric, TMs pearly, nares patent, no discharge or erythema, pharynx normal Oral cavity: MMM, no lesions Neck: supple, no lymphadenopathy, no thyromegaly, no masses Heart: RRR, normal S1, S2, no murmurs Lungs: CTA bilaterally, no wheezes, rhonchi, or rales Abdomen: +bs, soft, non tender, non distended, no masses, no hepatomegaly, no splenomegaly Musculoskeletal: nontender, no swelling, no obvious deformity Extremities: no edema, no cyanosis, no clubbing Pulses: 2+ symmetric, upper and lower extremities, normal cap refill Neurological: alert, oriented x 3, CN2-12 intact, strength normal upper extremities and lower extremities, sensation normal throughout, DTRs 2+ throughout, no cerebellar signs, gait normal Psychiatric: normal affect, behavior normal, pleasant  Breast: defer Gyn: defer Rectal: defer   Vicie Mutters, PA-C  12/02/2016  

## 2016-12-06 ENCOUNTER — Encounter: Payer: Self-pay | Admitting: Physician Assistant

## 2016-12-27 DIAGNOSIS — Z1231 Encounter for screening mammogram for malignant neoplasm of breast: Secondary | ICD-10-CM | POA: Diagnosis not present

## 2017-01-04 ENCOUNTER — Encounter: Payer: Self-pay | Admitting: Internal Medicine

## 2017-01-06 ENCOUNTER — Encounter: Payer: Self-pay | Admitting: Internal Medicine

## 2017-01-20 DIAGNOSIS — M79605 Pain in left leg: Secondary | ICD-10-CM | POA: Diagnosis not present

## 2017-01-20 DIAGNOSIS — M7981 Nontraumatic hematoma of soft tissue: Secondary | ICD-10-CM | POA: Diagnosis not present

## 2017-02-21 ENCOUNTER — Other Ambulatory Visit: Payer: Self-pay

## 2017-02-21 DIAGNOSIS — J069 Acute upper respiratory infection, unspecified: Secondary | ICD-10-CM

## 2017-02-21 MED ORDER — BENZONATATE 100 MG PO CAPS: 100.0000 mg | ORAL_CAPSULE | Freq: Four times a day (QID) | ORAL | 0 refills | Status: DC | PRN

## 2017-02-21 MED ORDER — SIMVASTATIN 20 MG PO TABS: 20.0000 mg | ORAL_TABLET | Freq: Every day | ORAL | 1 refills | Status: DC

## 2017-03-02 NOTE — Progress Notes (Signed)
CPE AND FOLLOW UP   Assessment:    Essential hypertension - continue medications, DASH diet, exercise and monitor at home. Call if greater than 130/80.  - CBC with Differential/Platelet - BASIC METABOLIC PANEL WITH GFR - Hepatic function panel - TSH  Other abnormal glucose - Hemoglobin A1c   Mixed hyperlipidemia -continue medications, check lipids, decrease fatty foods, increase activity.  - Lipid panel  TBI (traumatic brain injury), with loss of consciousness of unspecified duration, subsequent encounter ([place]) Hit head recently, no LOC, normal neuro exam patient to go to ER if there is weakness, thunderclap headache, visual changes, or any concerning factors  Vitamin D deficiency - VITAMIN D 25 Hydroxy (Vit-D Deficiency, Fractures)   Medication management - Magnesium   IBS (irritable bowel syndrome) Diet controlled   Diverticulosis of large intestine without hemorrhage Diet controlled, add fiber   Osteopenia Due 2 years, continue vitamin D  Future Appointments Date Time Provider [place]  03/10/2018 9:30 AM Vicie Mutters, PA-C [facility] None     Subjective:   Ashley Mayer is a 76 y.o. female who presents for CPE and 3 month follow up on hypertension, hyperlipidemia, vitamin D def.   Her blood pressure has been controlled at home, on 1/2 of losartan 50mg , today their BP is BP: 118/64 She does workout, constantly out in her yard and very active. She denies chest pain, shortness of breath, dizziness.  She is on cholesterol medication and denies myalgias. Her cholesterol is at goal. The cholesterol last visit was:   Lab Results  Component Value Date   CHOL 184 08/19/2016   HDL 41 (L) 08/19/2016   LDLCALC 105 (H) 08/19/2016   TRIG 190 (H) 08/19/2016   CHOLHDL 4.5 08/19/2016   Last A1C in the office was:  Lab Results  Component Value Date   HGBA1C 4.9 08/19/2016   Patient is on Vitamin D supplement. Lab Results  Component Value Date   VD25OH 50 05/17/2016   She is on estrace 1 mg 1/2 pill daily, s/p hysterectomy, she is on bASA, she is back on estrace after trial off due to hot flashes, understands risk.  She takes prilosec for GERD Has been having bilateral toe pain, 2nd toe pain, worse with walking, declines referall at this time.  She has catching and locking her left thumb.  She is on xanax as needed for anxiety.  BMI is Body mass index is 23.27 kg/m., she is working on diet and exercise. Wt Readings from Last 3 Encounters:  03/04/17 127 lb 3.2 oz (57.7 kg)  08/19/16 130 lb (59 kg)  05/17/16 130 lb 6.4 oz (59.1 kg)    Names of Other Physician/Practitioners you currently use: 2. [facility] doctor [facility], last visit 1 year, due to go back 3. , dentist, last visit 2 years, wants new dentist Patient Care Team: Unk Pinto, MD as PCP - General (Internal Medicine) Simona Huh, MD as Consulting Physician (Dermatology) Melina Schools, MD as Consulting Physician (Obstetrics and Gynecology) Lafayette Dragon, MD as Consulting Physician (Gastroenterology)   Medication Review Current Outpatient Prescriptions on File Prior to Visit  Medication Sig Dispense Refill  . ALPRAZolam (XANAX) 0.5 MG tablet TAKE ONE-HALF TO ONE TABLET BY MOUTH THREE TIMES DAILY AS NEEDED FOR ANXIETY OR  SLEEP 90 tablet 3  . aspirin 81 MG tablet Take 81 mg by mouth daily.    . benzonatate (TESSALON PERLES) 100 MG capsule Take 1 capsule (100 mg total) by mouth every  6 (six) hours as needed for cough. 60 capsule 0  . Calcium Carbonate-Vitamin D (CALCIUM + D PO) Take 1 tablet by mouth daily.     . Calcium-Vitamin D-Vitamin K (VIACTIV) [phone] MG-UNT-MCG CHEW Chew by mouth. Takes 1 gummy daily    . estradiol (ESTRACE) 1 MG tablet Take 1 tablet (1 mg total) by mouth daily. 90 tablet 1  . Flaxseed, Linseed, (FLAXSEED OIL) 1000 MG CAPS Take 1 capsule by mouth daily.    . fluticasone (FLONASE) 50 MCG/ACT nasal spray Place 2 sprays  into both nostrils daily. 16 g 0  . losartan (COZAAR) 50 MG tablet 1/2-1 pill daily for blood pressure 90 tablet 1  . multivitamin-lutein (OCUVITE-LUTEIN) CAPS Take 1 capsule by mouth daily.    Marland Kitchen OVER THE COUNTER MEDICATION Patient rotates OTC Nexium ,Tagamet and Prevacid for indigestion.    . simvastatin (ZOCOR) 20 MG tablet Take 1 tablet (20 mg total) by mouth at bedtime. 90 tablet 1  . Zinc 50 MG TABS Take 1 tablet by mouth daily.     No current facility-administered medications on file prior to visit.     Current Problems (verified) Patient Active Problem List   Diagnosis Date Noted  . Osteopenia 06/19/2015  . Medicare annual wellness visit, initial 03/14/2015  . TBI (traumatic brain injury) ([place]) 10/11/2014  . Vitamin D deficiency 08/15/2013  . Other abnormal glucose 08/15/2013  . Medication management 08/15/2013  . Mixed hyperlipidemia 12/21/2007  . Essential hypertension 12/21/2007  . Diverticulosis of large intestine 12/21/2007  . IBS (irritable bowel syndrome) 12/21/2007    Screening Tests Immunization History  Administered Date(s) Administered  . PPD Test 04/17/2013  . Td 11/02/2005   Preventative care: Last colonoscopy: 2009 due 2019 Last mammogram: 12/2016 Last pap smear/pelvic exam: 2013  Declines another DEXA: 04/2014, osteopenia  Prior vaccinations: TD or Tdap: 2007 DUE, DECLINES Influenza: declines Pneumococcal: declines Prevnar 13: declines Shingles/Zostavax: declines  Allergies Allergies  Allergen Reactions  . Codeine Other (See Comments)    hallucinations  . Lactose Intolerance (Gi) Diarrhea  . Tomato Diarrhea  . Wheat Bran Diarrhea  . Morphine And Related Other (See Comments)    Unknown-family history of allergy  . Tape Itching and Rash    SURGICAL HISTORY She  has a past surgical history that includes Bladder suspension (05/2009); Cholecystectomy (2000's); Vaginal hysterectomy (~ 1977); Cataract extraction w/ intraocular lens  implant,  bilateral (Bilateral, 2000's); and Anterior and posterior repair (05/2009). FAMILY HISTORY Her family history includes Asthma in her other; Cancer in her brother, father, and other; Diabetes in her sister; Heart attack in her other; Heart disease in her brother; Hemochromatosis in her son; Hyperlipidemia in her brother, mother, and other; Hypertension in her brother and other; Stroke in her brother, mother, and other. SOCIAL HISTORY She  reports that she has never smoked. She has never used smokeless tobacco. She reports that she does not drink alcohol or use drugs.   Objective:   Blood pressure 118/64, pulse 60, temperature 97.7 F (36.5 C), resp. rate 14, height 5\' 2"  (1.575 m), weight 127 lb 3.2 oz (57.7 kg), SpO2 95 %. Body mass index is 23.27 kg/m.  General appearance: alert, no distress, WD/WN,  female HEENT: normocephalic, sclerae anicteric, TMs pearly, nares patent, no discharge or erythema, pharynx normal Oral cavity: MMM, no lesions Neck: supple, no lymphadenopathy, no thyromegaly, no masses Heart: RRR, normal S1, S2, no murmurs Lungs: CTA bilaterally, no wheezes, rhonchi, or rales Abdomen: +bs, soft, non tender, non  distended, no masses, no hepatomegaly, no splenomegaly Musculoskeletal: nontender, no swelling, no obvious deformity Extremities: no edema, no cyanosis, no clubbing Pulses: 2+ symmetric, upper and lower extremities, normal cap refill Neurological: alert, oriented x 3, CN2-12 intact, strength normal upper extremities and lower extremities, sensation normal throughout, DTRs 2+ throughout, no cerebellar signs, gait normal Psychiatric: normal affect, behavior normal, pleasant  Breast: defer Gyn: defer Rectal: defer   Vicie Mutters, PA-C   03/04/2017

## 2017-03-04 ENCOUNTER — Encounter: Payer: Self-pay | Admitting: Physician Assistant

## 2017-03-04 ENCOUNTER — Ambulatory Visit (INDEPENDENT_AMBULATORY_CARE_PROVIDER_SITE_OTHER): Payer: Medicare HMO | Admitting: Physician Assistant

## 2017-03-04 VITALS — BP 118/64 | HR 60 | Temp 97.7°F | Resp 14 | Ht 62.0 in | Wt 127.2 lb

## 2017-03-04 DIAGNOSIS — G5763 Lesion of plantar nerve, bilateral lower limbs: Secondary | ICD-10-CM

## 2017-03-04 DIAGNOSIS — S069X9D Unspecified intracranial injury with loss of consciousness of unspecified duration, subsequent encounter: Secondary | ICD-10-CM

## 2017-03-04 DIAGNOSIS — I10 Essential (primary) hypertension: Secondary | ICD-10-CM | POA: Diagnosis not present

## 2017-03-04 DIAGNOSIS — Z79899 Other long term (current) drug therapy: Secondary | ICD-10-CM

## 2017-03-04 DIAGNOSIS — Z0000 Encounter for general adult medical examination without abnormal findings: Secondary | ICD-10-CM

## 2017-03-04 DIAGNOSIS — M8580 Other specified disorders of bone density and structure, unspecified site: Secondary | ICD-10-CM

## 2017-03-04 DIAGNOSIS — K589 Irritable bowel syndrome without diarrhea: Secondary | ICD-10-CM

## 2017-03-04 DIAGNOSIS — E559 Vitamin D deficiency, unspecified: Secondary | ICD-10-CM

## 2017-03-04 DIAGNOSIS — Z0001 Encounter for general adult medical examination with abnormal findings: Secondary | ICD-10-CM

## 2017-03-04 DIAGNOSIS — E782 Mixed hyperlipidemia: Secondary | ICD-10-CM | POA: Diagnosis not present

## 2017-03-04 DIAGNOSIS — K5730 Diverticulosis of large intestine without perforation or abscess without bleeding: Secondary | ICD-10-CM

## 2017-03-04 DIAGNOSIS — R7309 Other abnormal glucose: Secondary | ICD-10-CM

## 2017-03-04 DIAGNOSIS — M65312 Trigger thumb, left thumb: Secondary | ICD-10-CM

## 2017-03-04 NOTE — Patient Instructions (Addendum)
[employer] with no obligation # [phone] Do not have to be a member Tues-Sat 10-6    We can refer you to a podiatrist  Morton Neuralgia [place] Lathe neuralgia is a type of foot pain in the area closest to your toes. This area is sometimes called the ball of your foot. Morton neuralgia occurs when a branch of a nerve in your foot (digital nerve) becomes compressed. When this happens over a long period of time, the nerve can thicken (neuroma) and cause pain. This usually occurs between the third and fourth toe. Morton neuralgia can come and go but may get worse over time. What are the causes? Your digital nerve can become compressed and stretched at a point where it passes under a thick band of tissue that connects your toes (intermetatarsal ligament). Morton neuralgia can be caused by mild repetitive damage in this area. This type of damage can result from:  Activities such as running or jumping.  Wearing shoes that are too tight.  What increases the risk? You may be at risk for Morton neuralgia if you:  Are female.  Wear high heels.  Wear shoes that are narrow or tight.  Participate in activities that stretch your toes. These include: ? Running. ? [place]. ? Long-distance walking.  What are the signs or symptoms? The first symptom of Morton neuralgia is pain that spreads from the ball of your foot to your toes. It may feel like you are walking on a marble. Pain usually gets worse with walking and goes away at night. Other symptoms may include numbness and cramping of your toes. How is this diagnosed? Your health care provider will do a physical exam. When doing the exam, your health care provider may:  Squeeze your foot just behind your toe.  Ask you to move your toes to check for pain.  You may also have tests on your foot to confirm the diagnosis. These may include:  An X-ray.  An MRI.  How is this treated? Treatment for Morton neuralgia may be as  simple as changing the kind of shoes you wear. Other treatments may include:  Wearing a supportive pad (orthosis) under the front of your foot. This lifts your toe bones and takes pressure off the nerve.  Getting injections of numbing medicine and anti-inflammatory medicine (steroid) in the nerve.  Having surgery to remove part of the thickened nerve.  Follow these instructions at home:  Take medicine only as directed by your health care provider.  Wear soft-soled shoes with a wide toe area.  Stop activities that may be causing pain.  Elevate your foot when resting.  Massage your foot.  Apply ice to the injured area: ? Put ice in a plastic bag. ? Place a towel between your skin and the bag. ? Leave the ice on for 20 minutes, 2-3 times a day.  Keep all follow-up visits as directed by your health care provider. This is important. Contact a health care provider if:  Home care instructions are not helping you get better.  Your symptoms change or get worse. This information is not intended to replace advice given to you by your health care provider. Make sure you discuss any questions you have with your health care provider. Document Released: 08/30/2000 Document Revised: 10/30/2015 Document Reviewed: 07/25/2013 Elsevier Interactive Patient Education  2018 [employer].   Get oval 8 finger splint OR thumb spicca splint for left thumb If not better can refer to ortho  Trigger Finger Trigger finger (stenosing tenosynovitis) is a condition that causes a finger to get stuck in a bent position. Each finger has a tough, cord-like tissue that connects muscle to bone (tendon), and each tendon is surrounded by a tunnel of tissue (tendon sheath). To move your finger, your tendon needs to slide freely through the sheath. Trigger finger happens when the tendon or the sheath thickens, making it difficult to move your finger. Trigger finger can affect any finger or a thumb. It may affect more  than one finger. Mild cases may clear up with rest and medicine. Severe cases require more treatment. What are the causes? Trigger finger is caused by a thickened finger tendon or tendon sheath. The cause of this thickening is not known. What increases the risk? The following factors may make you more likely to develop this condition:  Doing activities that require a strong grip.  Having rheumatoid arthritis, gout, or diabetes.  Being 62-59 years old.  Being a woman.  What are the signs or symptoms? Symptoms of this condition include:  Pain when bending or straightening your finger.  Tenderness or swelling where your finger attaches to the palm of your hand.  A lump in the palm of your hand or on the inside of your finger.  Hearing a popping sound when you try to straighten your finger.  Feeling a popping, catching, or locking sensation when you try to straighten your finger.  Being unable to straighten your finger.  How is this diagnosed? This condition is diagnosed based on your symptoms and a physical exam. How is this treated? This condition may be treated by:  Resting your finger and avoiding activities that make symptoms worse.  Wearing a finger splint to keep your finger in a slightly bent position.  Taking NSAIDs to relieve pain and swelling.  Injecting medicine (steroids) into the tendon sheath to reduce swelling and irritation. Injections may need to be repeated.  Having surgery to open the tendon sheath. This may be done if other treatments do not work and you cannot straighten your finger. You may need physical therapy after surgery.  Follow these instructions at home:  Use moist heat to help reduce pain and swelling as told by your health care provider.  Rest your finger and avoid activities that make pain worse. Return to normal activities as told by your health care provider.  If you have a splint, wear it as told by your health care provider.  Take  over-the-counter and prescription medicines only as told by your health care provider.  Keep all follow-up visits as told by your health care provider. This is important. Contact a health care provider if:  Your symptoms are not improving with home care. Summary  Trigger finger (stenosing tenosynovitis) causes your finger to get stuck in a bent position, and it can make it difficult and painful to straighten your finger.  This condition develops when a finger tendon or tendon sheath thickens.  Treatment starts with resting, wearing a splint, and taking NSAIDs.  In severe cases, surgery to open the tendon sheath may be needed. This information is not intended to replace advice given to you by your health care provider. Make sure you discuss any questions you have with your health care provider. Document Released: 03/13/2004 Document Revised: 05/04/2016 Document Reviewed: 05/04/2016 Elsevier Interactive Patient Education  2017 [employer].

## 2017-03-05 LAB — CBC WITH DIFFERENTIAL/PLATELET
Basophils Absolute: 42 cells/uL (ref 0–200)
Basophils Relative: 0.7 %
EOS ABS: 276 {cells}/uL (ref 15–500)
Eosinophils Relative: 4.6 %
HCT: 38.3 % (ref 35.0–45.0)
Hemoglobin: 13.1 g/dL (ref 11.7–15.5)
Lymphs Abs: 1086 cells/uL (ref 850–3900)
MCH: 32.8 pg (ref 27.0–33.0)
MCHC: 34.2 g/dL (ref 32.0–36.0)
MCV: 95.8 fL (ref 80.0–100.0)
MONOS PCT: 7.3 %
MPV: 10.4 fL (ref 7.5–12.5)
Neutro Abs: 4158 cells/uL (ref 1500–7800)
Neutrophils Relative %: 69.3 %
PLATELETS: 281 10*3/uL (ref 140–400)
RBC: 4 10*6/uL (ref 3.80–5.10)
RDW: 12.1 % (ref 11.0–15.0)
TOTAL LYMPHOCYTE: 18.1 %
WBC mixed population: 438 cells/uL (ref 200–950)
WBC: 6 10*3/uL (ref 3.8–10.8)

## 2017-03-05 LAB — MICROALBUMIN / CREATININE URINE RATIO
CREATININE, URINE: 151 mg/dL (ref 20–275)
MICROALB UR: 1.9 mg/dL
MICROALB/CREAT RATIO: 13 ug/mg{creat} (ref <30)

## 2017-03-05 LAB — HEPATIC FUNCTION PANEL
AG Ratio: 1.9 (calc) (ref 1.0–2.5)
ALKALINE PHOSPHATASE (APISO): 68 U/L (ref 33–130)
ALT: 19 U/L (ref 6–29)
AST: 18 U/L (ref 10–35)
Albumin: 4.6 g/dL (ref 3.6–5.1)
BILIRUBIN INDIRECT: 0.5 mg/dL (ref 0.2–1.2)
Bilirubin, Direct: 0.1 mg/dL (ref 0.0–0.2)
GLOBULIN: 2.4 g/dL (ref 1.9–3.7)
TOTAL PROTEIN: 7 g/dL (ref 6.1–8.1)
Total Bilirubin: 0.6 mg/dL (ref 0.2–1.2)

## 2017-03-05 LAB — MAGNESIUM: Magnesium: 1.9 mg/dL (ref 1.5–2.5)

## 2017-03-05 LAB — LIPID PANEL
CHOLESTEROL: 186 mg/dL (ref <200)
HDL: 48 mg/dL — ABNORMAL LOW (ref >50)
LDL Cholesterol (Calc): 108 mg/dL (calc) — ABNORMAL HIGH
Non-HDL Cholesterol (Calc): 138 mg/dL (calc) — ABNORMAL HIGH (ref <130)
Total CHOL/HDL Ratio: 3.9 (calc) (ref <5.0)
Triglycerides: 187 mg/dL — ABNORMAL HIGH (ref <150)

## 2017-03-05 LAB — BASIC METABOLIC PANEL WITH GFR
BUN: 15 mg/dL (ref 7–25)
CHLORIDE: 107 mmol/L (ref 98–110)
CO2: 28 mmol/L (ref 20–32)
Calcium: 9.9 mg/dL (ref 8.6–10.4)
Creat: 0.91 mg/dL (ref 0.60–0.93)
GFR, Est African American: 71 mL/min/{1.73_m2} (ref >=60)
GFR, Est Non African American: 61 mL/min/{1.73_m2} (ref >=60)
GLUCOSE: 91 mg/dL (ref 65–99)
Potassium: 4.5 mmol/L (ref 3.5–5.3)
Sodium: 143 mmol/L (ref 135–146)

## 2017-03-05 LAB — TSH: TSH: 3.15 m[IU]/L (ref 0.40–4.50)

## 2017-03-10 NOTE — Progress Notes (Signed)
Pt aware of lab results & voiced understanding of those results.

## 2017-03-21 ENCOUNTER — Other Ambulatory Visit: Payer: Self-pay | Admitting: Internal Medicine

## 2017-05-12 DIAGNOSIS — I83893 Varicose veins of bilateral lower extremities with other complications: Secondary | ICD-10-CM | POA: Diagnosis not present

## 2017-05-19 ENCOUNTER — Other Ambulatory Visit: Payer: Self-pay | Admitting: Internal Medicine

## 2017-05-19 DIAGNOSIS — F411 Generalized anxiety disorder: Secondary | ICD-10-CM

## 2017-06-14 NOTE — Progress Notes (Signed)
FOLLOW UP  Assessment and Plan:   Hypertension Well controlled with current medications  Monitor blood pressure at home; patient to call if consistently greater than 130/80 Continue DASH diet.   Reminder to go to the ER if any CP, SOB, nausea, dizziness, severe HA, changes vision/speech, left arm numbness and tingling and jaw pain.  Cholesterol Currently LDL borderline above goal; dietary choices discussed at length Continue low cholesterol diet and exercise.  Check lipid panel.   Other abnormal glucose Recent A1Cs well controlled Continue diet and exercise.  Perform daily foot/skin check, notify office of any concerning changes.  Defer A1C; check BMP  Vitamin D Def Below goal at last check over a year ago; continue supplementation to maintain goal of 70-100 Unsure level of supplementation - she will follow up with [place] to update Check Vit D level  Left forearm flexor group tendinitis Mobic 15 mg daily x 2-4 weeks; AE discussed  Avoid aggravating movements, continue wearing band brace as needed May take up to 8-12 weeks to fully resolve Follow up if not improving  Continue diet and meds as discussed. Further disposition pending results of labs. Discussed med's effects and SE's.   Over 30 minutes of exam, counseling, chart review, and critical decision making was performed.   Future Appointments  Date Time Provider [place]  09/08/2017  9:30 AM Vicie Mutters, PA-C [facility] None  03/10/2018  9:30 AM Vicie Mutters, PA-C [facility] None    ----------------------------------------------------------------------------------------------------------------------  HPI 77 y.o. female  presents for 3 month follow up on hypertension, cholesterol, glucose management, weight and vitamin D deficiency. She also reports intermittent tenderness of L forearm near elbow - worse with rotation, lifting. She has been taking tylenol for this pain but ongoing x 8 weeks after she raked  leaves. She has also tried wearing a band, which has helped the discomfort some.   she has a diagnosis of anxiety and is currently on PRN xanax, reports symptoms are well controlled on current regimen. she take 0.25 mg approximately twice weekly to help wind down and sleep after a stressful day.   BMI is Body mass index is 24.14 kg/m., she has been working on diet and exercise. Wt Readings from Last 3 Encounters:  06/15/17 132 lb (59.9 kg)  03/04/17 127 lb 3.2 oz (57.7 kg)  08/19/16 130 lb (59 kg)   Her blood pressure has been controlled at home, today their BP is BP: (!) 112/54  She does workout. She denies chest pain, shortness of breath, dizziness.   She is on cholesterol medication (simvastatin 20 mg) and denies myalgias. Her cholesterol is not at goal. The cholesterol last visit was:   Lab Results  Component Value Date   CHOL 186 03/04/2017   HDL 48 (L) 03/04/2017   LDLCALC 105 (H) 08/19/2016   TRIG 187 (H) 03/04/2017   CHOLHDL 3.9 03/04/2017    She has been working on diet and exercise for glucose management, and denies increased appetite, nausea, paresthesia of the feet, polydipsia, polyuria, visual disturbances and vomiting. Last A1C in the office was:  Lab Results  Component Value Date   HGBA1C 4.9 08/19/2016   Patient is on Vitamin D supplement but remained below goal at the last check:   Lab Results  Component Value Date   VD25OH 50 05/17/2016        Current Medications:  Current Outpatient Medications on File Prior to Visit  Medication Sig  . ALPRAZolam (XANAX) 0.5 MG tablet Take 1/2 to 1  tablet 2 to 3 x / day ONLY if needed for anxiety attack  and please try to limit to 5 days /week to avoid addiction  . aspirin 81 MG tablet Take 81 mg by mouth daily.  . benzonatate (TESSALON PERLES) 100 MG capsule Take 1 capsule (100 mg total) by mouth every 6 (six) hours as needed for cough.  . Calcium Carbonate-Vitamin D (CALCIUM + D PO) Take 1 tablet by mouth daily.   .  Calcium-Vitamin D-Vitamin K (VIACTIV) [phone] MG-UNT-MCG CHEW Chew by mouth. Takes 1 gummy daily  . estradiol (ESTRACE) 1 MG tablet TAKE ONE TABLET BY MOUTH ONCE DAILY  . Flaxseed, Linseed, (FLAXSEED OIL) 1000 MG CAPS Take 1 capsule by mouth daily.  . fluticasone (FLONASE) 50 MCG/ACT nasal spray Place 2 sprays into both nostrils daily.  . multivitamin-lutein (OCUVITE-LUTEIN) CAPS Take 1 capsule by mouth daily.  Marland Kitchen OVER THE COUNTER MEDICATION Patient rotates OTC Nexium ,Tagamet and Prevacid for indigestion.  . simvastatin (ZOCOR) 20 MG tablet Take 1 tablet (20 mg total) by mouth at bedtime.  . Zinc 50 MG TABS Take 1 tablet by mouth daily.  Marland Kitchen losartan (COZAAR) 50 MG tablet 1/2-1 pill daily for blood pressure   No current facility-administered medications on file prior to visit.      Allergies:  Allergies  Allergen Reactions  . Codeine Other (See Comments)    hallucinations  . Lactose Intolerance (Gi) Diarrhea  . Tomato Diarrhea  . Wheat Bran Diarrhea  . Morphine And Related Other (See Comments)    Unknown-family history of allergy  . Tape Itching and Rash     Medical History:  Past Medical History:  Diagnosis Date  . Arthritis    "hands" (09/17/2013)  . GERD (gastroesophageal reflux disease)   . Hyperlipidemia   . Hypertension   . IBS (irritable bowel syndrome)   . Multiple food allergies   . Osteopenia   . Unspecified vitamin D deficiency    Family history- Reviewed and unchanged Social history- Reviewed and unchanged   Review of Systems:  Review of Systems  Constitutional: Negative for malaise/fatigue and weight loss.  HENT: Negative for hearing loss and tinnitus.   Eyes: Negative for blurred vision and double vision.  Respiratory: Negative for cough, shortness of breath and wheezing.   Cardiovascular: Negative for chest pain, palpitations, orthopnea, claudication and leg swelling.  Gastrointestinal: Negative for abdominal pain, blood in stool, constipation,  diarrhea, heartburn, melena, nausea and vomiting.  Genitourinary: Negative.   Musculoskeletal: Positive for myalgias (Left forearm). Negative for joint pain.  Skin: Negative for rash.  Neurological: Negative for dizziness, tingling, sensory change, weakness and headaches.  Endo/Heme/Allergies: Negative for polydipsia.  Psychiatric/Behavioral: Negative.   All other systems reviewed and are negative.     Physical Exam: BP (!) 112/54   Pulse 66   Temp (!) 97.5 F (36.4 C)   Ht 5\' 2"  (1.575 m)   Wt 132 lb (59.9 kg)   SpO2 97%   BMI 24.14 kg/m  Wt Readings from Last 3 Encounters:  06/15/17 132 lb (59.9 kg)  03/04/17 127 lb 3.2 oz (57.7 kg)  08/19/16 130 lb (59 kg)   General Appearance: Well nourished, in no apparent distress. Eyes: PERRLA, EOMs, conjunctiva no swelling or erythema Sinuses: No Frontal/maxillary tenderness ENT/Mouth: Ext aud canals clear, TMs without erythema, bulging. No erythema, swelling, or exudate on post pharynx.  Tonsils not swollen or erythematous. Hearing normal.  Neck: Supple, thyroid normal.  Respiratory: Respiratory effort normal, BS equal  bilaterally without rales, rhonchi, wheezing or stridor.  Cardio: RRR with no MRGs. Brisk peripheral pulses without edema.  Abdomen: Soft, + BS.  Non tender, no guarding, rebound, hernias, masses. Lymphatics: Non tender without lymphadenopathy.  Musculoskeletal: Full ROM, 5/5 strength, Normal gait. Kyphosis and bony arthritic changes to bilateral hands noted. Left forearm with tenderness to palpation of origin of lateral flexor group. No weakness, tenderness or effusion over epicondyles. No joint effusion.  Skin: Warm, dry without rashes, lesions, ecchymosis.  Neuro: Cranial nerves intact. No cerebellar symptoms.  Psych: Awake and oriented X 3, normal affect, Insight and Judgment appropriate.    Izora Ribas, NP 4:40 PM [facility]

## 2017-06-15 ENCOUNTER — Encounter: Payer: Self-pay | Admitting: Adult Health

## 2017-06-15 ENCOUNTER — Ambulatory Visit (INDEPENDENT_AMBULATORY_CARE_PROVIDER_SITE_OTHER): Payer: Medicare HMO | Admitting: Adult Health

## 2017-06-15 VITALS — BP 112/54 | HR 66 | Temp 97.5°F | Ht 62.0 in | Wt 132.0 lb

## 2017-06-15 DIAGNOSIS — I10 Essential (primary) hypertension: Secondary | ICD-10-CM

## 2017-06-15 DIAGNOSIS — E782 Mixed hyperlipidemia: Secondary | ICD-10-CM

## 2017-06-15 DIAGNOSIS — M778 Other enthesopathies, not elsewhere classified: Secondary | ICD-10-CM

## 2017-06-15 DIAGNOSIS — M779 Enthesopathy, unspecified: Secondary | ICD-10-CM

## 2017-06-15 DIAGNOSIS — Z79899 Other long term (current) drug therapy: Secondary | ICD-10-CM

## 2017-06-15 DIAGNOSIS — R7309 Other abnormal glucose: Secondary | ICD-10-CM

## 2017-06-15 DIAGNOSIS — E559 Vitamin D deficiency, unspecified: Secondary | ICD-10-CM

## 2017-06-15 MED ORDER — MELOXICAM 15 MG PO TABS: ORAL_TABLET | ORAL | 1 refills | Status: DC

## 2017-06-15 NOTE — Patient Instructions (Addendum)
Choose either grass fed butter or choose margarine that is formulated to be low trans fat and low saturated fat.    Take mobic daily; avoid taking ibuprofen/aleve with this medication, can take with tylenol if needed. Try to rest the arm as much as possible, wear the support band for another 2-4 weeks while taking mobic. Orthopedic conditions can take up to 8-12 weeks to fully heal, so try to be patient.     What types of fat should I choose?  Choose healthy fats more often. Choose monounsaturated and polyunsaturated fats, such as olive and canola oil, flaxseeds, walnuts, almonds, and seeds.  Eat more omega-3 fats. Good choices include salmon, mackerel, sardines, tuna, flaxseed oil, and ground flaxseeds. Aim to eat fish at least two times a week.  Limit saturated fats. Saturated fats are primarily found in animal products, such as meats, butter, and cream. Plant sources of saturated fats include palm oil, palm kernel oil, and coconut oil.  Avoid foods with partially hydrogenated oils in them. These contain trans fats. Examples of foods that contain trans fats are stick margarine, some tub margarines, cookies, crackers, and other baked goods.  Eat 20-30 grams of fiber each day.  What general guidelines do I need to follow? These guidelines for healthy eating will help you control your intake of fat and cholesterol:  Check food labels carefully to identify foods with trans fats or high amounts of saturated fat.  Fill one half of your plate with vegetables and green salads.  Fill one fourth of your plate with whole grains. Look for the word "whole" as the first word in the ingredient list.  Fill one fourth of your plate with lean protein foods.  Limit fruit to two servings a day. Choose fruit instead of juice.  Eat more foods that contain fiber, such as apples, broccoli, carrots, beans, peas, and barley.  Eat more home-cooked food and less restaurant, buffet, and fast food.  Limit  or avoid alcohol.  Limit foods high in starch and sugar.  Limit fried foods.  Cook foods using methods other than frying. Baking, boiling, grilling, and broiling are all great options.  Lose weight if you are overweight. Losing just 5-10% of your initial body weight can help your overall health and prevent diseases such as diabetes and heart disease.  What foods can I eat? Grains  Whole grains, such as whole wheat or whole grain breads, crackers, cereals, and pasta. Unsweetened oatmeal, bulgur, barley, quinoa, or brown rice. Corn or whole wheat flour tortillas. Vegetables  Fresh or frozen vegetables (raw, steamed, roasted, or grilled). Green salads. Fruits  All fresh, canned (in natural juice), or frozen fruits. Meats and other protein foods  Ground beef (85% or leaner), grass-fed beef, or beef trimmed of fat. Skinless chicken or [place]. Ground chicken or [place]. Pork trimmed of fat. All fish and seafood. Eggs. Dried beans, peas, or lentils. Unsalted nuts or seeds. Unsalted canned or dry beans. Dairy  Low-fat dairy products, such as skim or 1% milk, 2% or reduced-fat cheeses, low-fat ricotta or cottage cheese, or plain low-fat yo Fats and oils  Tub margarines without trans fats. Light or reduced-fat mayonnaise and salad dressings. Avocado. Olive, canola, sesame, or safflower oils. Natural peanut or almond butter (choose ones without added sugar and oil). The items listed above may not be a complete list of recommended foods or beverages. Contact your dietitian for more options. Foods to avoid Grains  White bread. White pasta. White rice. Cornbread. Bagels,  pastries, and croissants. Crackers that contain trans fat. Vegetables  White potatoes. Corn. Creamed or fried vegetables. Vegetables in a cheese sauce. Fruits  Dried fruits. Canned fruit in light or heavy syrup. Fruit juice. Meats and other protein foods  Fatty cuts of meat. Ribs, chicken wings, bacon, sausage, bologna,  salami, chitterlings, fatback, hot dogs, bratwurst, and packaged luncheon meats. Liver and organ meats. Dairy  Whole or 2% milk, cream, half-and-half, and cream cheese. Whole milk cheeses. Whole-fat or sweetened yogurt. Full-fat cheeses. Nondairy creamers and whipped toppings. Processed cheese, cheese spreads, or cheese curds. Beverages  Alcohol. Sweetened drinks (such as sodas, lemonade, and fruit drinks or punches). Fats and oils  Butter, stick margarine, lard, shortening, ghee, or bacon fat. Coconut, palm kernel, or palm oils. Sweets and desserts  Corn syrup, sugars, honey, and molasses. Candy. Jam and jelly. Syrup. Sweetened cereals. Cookies, pies, cakes, donuts, muffins, and ice cream. The items listed above may not be a complete list of foods and beverages to avoid. Contact your dietitian for more information. This information is not intended to replace advice given to you by your health care provider. Make sure you discuss any questions you have with your health care provider. Document Released: 05/24/2005 Document Revised: 06/14/2014 Document Reviewed: 08/22/2013 Elsevier Interactive Patient Education  [employer].

## 2017-06-16 LAB — CBC WITH DIFFERENTIAL/PLATELET
BASOS ABS: 62 {cells}/uL (ref 0–200)
BASOS PCT: 0.8 %
EOS ABS: 421 {cells}/uL (ref 15–500)
Eosinophils Relative: 5.4 %
HEMATOCRIT: 36.8 % (ref 35.0–45.0)
HEMOGLOBIN: 12.5 g/dL (ref 11.7–15.5)
LYMPHS ABS: 1303 {cells}/uL (ref 850–3900)
MCH: 31.6 pg (ref 27.0–33.0)
MCHC: 34 g/dL (ref 32.0–36.0)
MCV: 92.9 fL (ref 80.0–100.0)
MPV: 10.8 fL (ref 7.5–12.5)
Monocytes Relative: 7.7 %
NEUTROS ABS: 5413 {cells}/uL (ref 1500–7800)
Neutrophils Relative %: 69.4 %
Platelets: 280 10*3/uL (ref 140–400)
RBC: 3.96 10*6/uL (ref 3.80–5.10)
RDW: 11.8 % (ref 11.0–15.0)
Total Lymphocyte: 16.7 %
WBC: 7.8 10*3/uL (ref 3.8–10.8)
WBCMIX: 601 {cells}/uL (ref 200–950)

## 2017-06-16 LAB — LIPID PANEL
Cholesterol: 184 mg/dL (ref <200)
HDL: 50 mg/dL — ABNORMAL LOW (ref >50)
LDL CHOLESTEROL (CALC): 102 mg/dL — AB
Non-HDL Cholesterol (Calc): 134 mg/dL (calc) — ABNORMAL HIGH (ref <130)
Total CHOL/HDL Ratio: 3.7 (calc) (ref <5.0)
Triglycerides: 196 mg/dL — ABNORMAL HIGH (ref <150)

## 2017-06-16 LAB — BASIC METABOLIC PANEL WITH GFR
BUN: 20 mg/dL (ref 7–25)
CO2: 27 mmol/L (ref 20–32)
CREATININE: 0.91 mg/dL (ref 0.60–0.93)
Calcium: 9.4 mg/dL (ref 8.6–10.4)
Chloride: 104 mmol/L (ref 98–110)
GFR, EST AFRICAN AMERICAN: 71 mL/min/{1.73_m2} (ref >=60)
GFR, EST NON AFRICAN AMERICAN: 61 mL/min/{1.73_m2} (ref >=60)
Glucose, Bld: 92 mg/dL (ref 65–99)
Potassium: 4.4 mmol/L (ref 3.5–5.3)
SODIUM: 139 mmol/L (ref 135–146)

## 2017-06-16 LAB — HEPATIC FUNCTION PANEL
AG Ratio: 1.7 (calc) (ref 1.0–2.5)
ALBUMIN MSPROF: 4.1 g/dL (ref 3.6–5.1)
ALKALINE PHOSPHATASE (APISO): 64 U/L (ref 33–130)
ALT: 12 U/L (ref 6–29)
AST: 15 U/L (ref 10–35)
BILIRUBIN DIRECT: 0.1 mg/dL (ref 0.0–0.2)
BILIRUBIN INDIRECT: 0.2 mg/dL (ref 0.2–1.2)
BILIRUBIN TOTAL: 0.3 mg/dL (ref 0.2–1.2)
Globulin: 2.4 g/dL (calc) (ref 1.9–3.7)
Total Protein: 6.5 g/dL (ref 6.1–8.1)

## 2017-06-16 LAB — TSH: TSH: 2.87 m[IU]/L (ref 0.40–4.50)

## 2017-06-16 LAB — VITAMIN D 25 HYDROXY (VIT D DEFICIENCY, FRACTURES): Vit D, 25-Hydroxy: 31 ng/mL (ref 30–100)

## 2017-07-01 ENCOUNTER — Other Ambulatory Visit: Payer: Self-pay

## 2017-07-01 MED ORDER — GABAPENTIN 100 MG PO CAPS: 100.0000 mg | ORAL_CAPSULE | Freq: Three times a day (TID) | ORAL | 2 refills | Status: DC

## 2017-07-05 ENCOUNTER — Other Ambulatory Visit: Payer: Self-pay

## 2017-08-12 ENCOUNTER — Other Ambulatory Visit: Payer: Self-pay | Admitting: Physician Assistant

## 2017-08-19 ENCOUNTER — Other Ambulatory Visit: Payer: Self-pay | Admitting: Internal Medicine

## 2017-08-19 DIAGNOSIS — I10 Essential (primary) hypertension: Secondary | ICD-10-CM

## 2017-08-19 MED ORDER — TELMISARTAN 80 MG PO TABS: ORAL_TABLET | ORAL | 1 refills | Status: DC

## 2017-09-05 ENCOUNTER — Encounter: Payer: Self-pay | Admitting: Gastroenterology

## 2017-09-06 NOTE — Progress Notes (Signed)
MEDICARE WELLNESS AND FOLLOW UP   Assessment:    Essential hypertension WILL CUT MICARIDIS DOWN TO 20 MG EVERY DAY- THE 40 MG DAILY IS TOO MUCH AND SHE DOES NOT WANT TO CUT THEM IN HALF - continue medications, DASH diet, exercise and monitor at home. Call if greater than 130/80.  - CBC with Differential/Platelet - BASIC METABOLIC PANEL WITH GFR - Hepatic function panel - TSH   Mixed hyperlipidemia -continue medications, check lipids, decrease fatty foods, increase activity.  - Lipid panel  TBI (traumatic brain injury), with loss of consciousness of unspecified duration, subsequent encounter ([place]) Hit head recently, no LOC, normal neuro exam patient to go to ER if there is weakness, thunderclap headache, visual changes, or any concerning factors  Vitamin D deficiency - VITAMIN D 25 Hydroxy (Vit-D Deficiency, Fractures)   Medication management - Magnesium   IBS (irritable bowel syndrome) Diet controlled   Diverticulosis of large intestine without hemorrhage Diet controlled, add fiber   Osteopenia Due next year, continue vitamin D   Future Appointments  Date Time Provider [place]  03/10/2018  9:30 AM Vicie Mutters, PA-C [id number] None    Plan:   During the course of the visit the patient was educated and counseled about appropriate screening and preventive services including:    Pneumococcal vaccine   Prevnar 13  Influenza vaccine  Td vaccine  Screening electrocardiogram  Bone densitometry screening  Colorectal cancer screening  Diabetes screening  Glaucoma screening  Nutrition counseling   Advanced directives: requested    Subjective:   Ashley Mayer is a 77 y.o. female who presents for medicare wellness and 3 month follow up on hypertension, hyperlipidemia, vitamin D def.   Her blood pressure has been controlled at home, on Micardis 80mg  1/2 a day, today their BP is BP: 128/64 She does workout, constantly out in her yard and  very active. She denies chest pain, shortness of breath, dizziness.  She is on cholesterol medication, simvastatin 20, and denies myalgias. Her cholesterol is at goal. The cholesterol last visit was:   Lab Results  Component Value Date   CHOL 184 06/15/2017   HDL 50 (L) 06/15/2017   LDLCALC 102 (H) 06/15/2017   TRIG 196 (H) 06/15/2017   CHOLHDL 3.7 06/15/2017   Last A1C in the office was:  Lab Results  Component Value Date   HGBA1C 4.9 08/19/2016   Patient is on Vitamin D supplement. Lab Results  Component Value Date   VD25OH 31 06/15/2017   She is on estrace 1 mg 1/2 pill daily, s/p hysterectomy, she is on bASA, she is back on estrace after trial off due to hot flashes, understands risk.  She takes prilosec for GERD She is on mobic AS needed She is on xanax as needed for anxiety, takes 0.25mg  up to 2 x a week.  BMI is Body mass index is 24.91 kg/m., she is working on diet and exercise. Wt Readings from Last 3 Encounters:  09/08/17 134 lb (60.8 kg)  06/15/17 132 lb (59.9 kg)  03/04/17 127 lb 3.2 oz (57.7 kg)    Names of Other Physician/Practitioners you currently use: 2. [facility], last visit 2018 3. , dentist, last visit 2 years, wants new dentist Patient Care Team: Unk Pinto, MD as PCP - General (Internal Medicine) Simona Huh, MD as Consulting Physician (Dermatology) Melina Schools, MD as Consulting Physician (Obstetrics and Gynecology) Lafayette Dragon, MD as Consulting Physician (Gastroenterology)   Medication  Review Current Outpatient Medications on File Prior to Visit  Medication Sig Dispense Refill  . aspirin 81 MG tablet Take 81 mg by mouth daily.    . benzonatate (TESSALON PERLES) 100 MG capsule Take 1 capsule (100 mg total) by mouth every 6 (six) hours as needed for cough. 60 capsule 0  . Calcium Carbonate-Vitamin D (CALCIUM + D PO) Take 1 tablet by mouth daily.     . Calcium-Vitamin D-Vitamin K (VIACTIV) [phone]  MG-UNT-MCG CHEW Chew by mouth. Takes 1 gummy daily    . fluticasone (FLONASE) 50 MCG/ACT nasal spray Place 2 sprays into both nostrils daily. 16 g 0  . meloxicam (MOBIC) 15 MG tablet Take one daily with food for 2 weeks, can take with tylenol, can not take with aleve, iburpofen, then as needed daily for pain 30 tablet 1  . multivitamin-lutein (OCUVITE-LUTEIN) CAPS Take 1 capsule by mouth daily.    Marland Kitchen OVER THE COUNTER MEDICATION Patient rotates OTC Nexium ,Tagamet and Prevacid for indigestion.    . simvastatin (ZOCOR) 20 MG tablet TAKE 1 TABLET BY MOUTH AT BEDTIME 90 tablet 1  . telmisartan (MICARDIS) 80 MG tablet Take 1/2 to 1 tablet daily as directed for BP 90 tablet 1  . Zinc 50 MG TABS Take 1 tablet by mouth daily.     No current facility-administered medications on file prior to visit.     Current Problems (verified) Patient Active Problem List   Diagnosis Date Noted  . Osteopenia 06/19/2015  . History of traumatic brain injury 10/11/2014  . Vitamin D deficiency 08/15/2013  . Other abnormal glucose 08/15/2013  . Medication management 08/15/2013  . Mixed hyperlipidemia 12/21/2007  . Essential hypertension 12/21/2007  . Diverticulosis of large intestine 12/21/2007  . IBS (irritable bowel syndrome) 12/21/2007    Screening Tests Immunization History  Administered Date(s) Administered  . PPD Test 04/17/2013  . Td 11/02/2005   Preventative care: Last colonoscopy: 09/2007 due 2019 has appointment Last mammogram: 12/2016 Last pap smear/pelvic exam: 2013  Declines another DEXA: 05/2016, osteopenia, [facility] -1.4  Prior vaccinations: TD or Tdap: 2007 DUE, DECLINES Influenza: declines Pneumococcal: declines Prevnar 13: declines Shingles/Zostavax: declines  Allergies Allergies  Allergen Reactions  . Codeine Other (See Comments)    hallucinations  . Lactose Intolerance (Gi) Diarrhea  . Tomato Diarrhea  . Wheat Bran Diarrhea  . Morphine And Related Other (See Comments)     Unknown-family history of allergy  . Tape Itching and Rash    SURGICAL HISTORY She  has a past surgical history that includes Bladder suspension (05/2009); Cholecystectomy (2000's); Vaginal hysterectomy (~ 1977); Cataract extraction w/ intraocular lens  implant, bilateral (Bilateral, 2000's); and Anterior and posterior repair (05/2009). FAMILY HISTORY Her family history includes Asthma in her other; Cancer in her brother, father, and other; Diabetes in her sister; Heart attack in her other; Heart disease in her brother; Hemochromatosis in her son; Hyperlipidemia in her brother, mother, and other; Hypertension in her brother and other; Stroke in her brother, mother, and other. SOCIAL HISTORY She  reports that she has never smoked. She has never used smokeless tobacco. She reports that she does not drink alcohol or use drugs.  MEDICARE WELLNESS OBJECTIVES: Physical activity:   Cardiac risk factors:   Depression/mood screen:   Depression screen [facility] 2/9 08/19/2016  Decreased Interest 0  Down, Depressed, Hopeless 0  PHQ - 2 Score 0    ADLs:  No flowsheet data found.   Cognitive Testing  Alert? Yes  Normal Appearance?Yes  Oriented to person? Yes  Place? Yes   Time? Yes  Recall of three objects?  Yes  Can perform simple calculations? Yes  Displays appropriate judgment?Yes  Can read the correct time from a watch face?Yes  EOL planning: Does Patient Have a Medical Advance Directive?: Yes Type of Advance Directive: Healthcare Power of Attorney, Living will [place] in Chart?: No - copy requested     Objective:   Blood pressure 128/64, pulse 61, temperature 97.6 F (36.4 C), resp. rate 16, height 5' 1.5" (1.562 m), weight 134 lb (60.8 kg), SpO2 97 %. Body mass index is 24.91 kg/m.  General appearance: alert, no distress, WD/WN,  female HEENT: normocephalic, sclerae anicteric, TMs pearly, nares patent, no discharge or erythema, pharynx normal Oral  cavity: MMM, no lesions Neck: supple, no lymphadenopathy, no thyromegaly, no masses Heart: RRR, normal S1, S2, no murmurs Lungs: CTA bilaterally, no wheezes, rhonchi, or rales Abdomen: +bs, soft, non tender, non distended, no masses, no hepatomegaly, no splenomegaly Musculoskeletal: nontender, no swelling, no obvious deformity Extremities: no edema, no cyanosis, no clubbing Pulses: 2+ symmetric, upper and lower extremities, normal cap refill Neurological: alert, oriented x 3, CN2-12 intact, strength normal upper extremities and lower extremities, sensation normal throughout, DTRs 2+ throughout, no cerebellar signs, gait normal Psychiatric: normal affect, behavior normal, pleasant  Breast: defer Gyn: defer Rectal: defer   Medicare Attestation I have personally reviewed: The patient's medical and social history Their use of alcohol, tobacco or illicit drugs Their current medications and supplements The patient's functional ability including ADLs,fall risks, home safety risks, cognitive, and hearing and visual impairment Diet and physical activities Evidence for depression or mood disorders  The patient's weight, height, BMI, and visual acuity have been recorded in the chart.  I have made referrals, counseling, and provided education to the patient based on review of the above and I have provided the patient with a written personalized care plan for preventive services.     Vicie Mutters, PA-C   09/08/2017

## 2017-09-08 ENCOUNTER — Encounter: Payer: Self-pay | Admitting: Physician Assistant

## 2017-09-08 ENCOUNTER — Ambulatory Visit (INDEPENDENT_AMBULATORY_CARE_PROVIDER_SITE_OTHER): Payer: Medicare HMO | Admitting: Physician Assistant

## 2017-09-08 VITALS — BP 128/64 | HR 61 | Temp 97.6°F | Resp 16 | Ht 61.5 in | Wt 134.0 lb

## 2017-09-08 DIAGNOSIS — Z79899 Other long term (current) drug therapy: Secondary | ICD-10-CM | POA: Diagnosis not present

## 2017-09-08 DIAGNOSIS — K589 Irritable bowel syndrome without diarrhea: Secondary | ICD-10-CM | POA: Diagnosis not present

## 2017-09-08 DIAGNOSIS — F411 Generalized anxiety disorder: Secondary | ICD-10-CM

## 2017-09-08 DIAGNOSIS — M8580 Other specified disorders of bone density and structure, unspecified site: Secondary | ICD-10-CM | POA: Diagnosis not present

## 2017-09-08 DIAGNOSIS — E559 Vitamin D deficiency, unspecified: Secondary | ICD-10-CM | POA: Diagnosis not present

## 2017-09-08 DIAGNOSIS — K5730 Diverticulosis of large intestine without perforation or abscess without bleeding: Secondary | ICD-10-CM | POA: Diagnosis not present

## 2017-09-08 DIAGNOSIS — Z0001 Encounter for general adult medical examination with abnormal findings: Secondary | ICD-10-CM | POA: Diagnosis not present

## 2017-09-08 DIAGNOSIS — I10 Essential (primary) hypertension: Secondary | ICD-10-CM | POA: Diagnosis not present

## 2017-09-08 DIAGNOSIS — R7309 Other abnormal glucose: Secondary | ICD-10-CM

## 2017-09-08 DIAGNOSIS — R6889 Other general symptoms and signs: Secondary | ICD-10-CM

## 2017-09-08 DIAGNOSIS — E782 Mixed hyperlipidemia: Secondary | ICD-10-CM

## 2017-09-08 DIAGNOSIS — Z6824 Body mass index (BMI) 24.0-24.9, adult: Secondary | ICD-10-CM | POA: Diagnosis not present

## 2017-09-08 DIAGNOSIS — Z87820 Personal history of traumatic brain injury: Secondary | ICD-10-CM

## 2017-09-08 LAB — CBC WITH DIFFERENTIAL/PLATELET
BASOS ABS: 72 {cells}/uL (ref 0–200)
Basophils Relative: 1.2 %
EOS PCT: 4.8 %
Eosinophils Absolute: 288 cells/uL (ref 15–500)
HCT: 35.3 % (ref 35.0–45.0)
Hemoglobin: 12.3 g/dL (ref 11.7–15.5)
Lymphs Abs: 1056 cells/uL (ref 850–3900)
MCH: 33.2 pg — ABNORMAL HIGH (ref 27.0–33.0)
MCHC: 34.8 g/dL (ref 32.0–36.0)
MCV: 95.1 fL (ref 80.0–100.0)
MONOS PCT: 8.2 %
MPV: 10.9 fL (ref 7.5–12.5)
NEUTROS PCT: 68.2 %
Neutro Abs: 4092 cells/uL (ref 1500–7800)
Platelets: 268 10*3/uL (ref 140–400)
RBC: 3.71 10*6/uL — ABNORMAL LOW (ref 3.80–5.10)
RDW: 12 % (ref 11.0–15.0)
Total Lymphocyte: 17.6 %
WBC mixed population: 492 cells/uL (ref 200–950)
WBC: 6 10*3/uL (ref 3.8–10.8)

## 2017-09-08 LAB — HEPATIC FUNCTION PANEL
AG Ratio: 1.7 (calc) (ref 1.0–2.5)
ALBUMIN MSPROF: 4.2 g/dL (ref 3.6–5.1)
ALT: 16 U/L (ref 6–29)
AST: 16 U/L (ref 10–35)
Alkaline phosphatase (APISO): 68 U/L (ref 33–130)
BILIRUBIN DIRECT: 0.1 mg/dL (ref 0.0–0.2)
GLOBULIN: 2.5 g/dL (ref 1.9–3.7)
Indirect Bilirubin: 0.3 mg/dL (calc) (ref 0.2–1.2)
Total Bilirubin: 0.4 mg/dL (ref 0.2–1.2)
Total Protein: 6.7 g/dL (ref 6.1–8.1)

## 2017-09-08 LAB — BASIC METABOLIC PANEL WITH GFR
BUN/Creatinine Ratio: 21 (calc) (ref 6–22)
BUN: 20 mg/dL (ref 7–25)
CALCIUM: 9.8 mg/dL (ref 8.6–10.4)
CHLORIDE: 107 mmol/L (ref 98–110)
CO2: 29 mmol/L (ref 20–32)
Creat: 0.97 mg/dL — ABNORMAL HIGH (ref 0.60–0.93)
GFR, EST AFRICAN AMERICAN: 65 mL/min/{1.73_m2} (ref >=60)
GFR, EST NON AFRICAN AMERICAN: 56 mL/min/{1.73_m2} — AB (ref >=60)
Glucose, Bld: 72 mg/dL (ref 65–99)
POTASSIUM: 5.5 mmol/L — AB (ref 3.5–5.3)
Sodium: 141 mmol/L (ref 135–146)

## 2017-09-08 LAB — LIPID PANEL
Cholesterol: 182 mg/dL (ref <200)
HDL: 54 mg/dL (ref >50)
LDL CHOLESTEROL (CALC): 97 mg/dL
Non-HDL Cholesterol (Calc): 128 mg/dL (calc) (ref <130)
TRIGLYCERIDES: 224 mg/dL — AB (ref <150)
Total CHOL/HDL Ratio: 3.4 (calc) (ref <5.0)

## 2017-09-08 LAB — TSH: TSH: 2.77 m[IU]/L (ref 0.40–4.50)

## 2017-09-08 MED ORDER — ESTRADIOL 1 MG PO TABS: ORAL_TABLET | ORAL | 1 refills | Status: DC

## 2017-09-08 NOTE — Patient Instructions (Signed)
Mobic is an antiinflammatory It helps pain, can not take with aleve, or ibuprofen You can take tylenol (500mg ) or tylenol arthritis (650mg ) with the meloxicam/antiinflammatories. The max you can take of tylenol a day is 3000mg  daily, this is a max of 6 pills a day of the regular tyelnol (500mg ) or a max of 4 a day of the tylenol arthritis (650mg ) as long as no other medications you are taking contain tylenol.   Mobic can cause inflammation in your stomach and can cause ulcers or bleeding, this will look like black tarry stools Make sure you take your mobic with food Try not to take it daily, take AS needed Can take with [place] [facility]- free test with no obligation # [phone] MUST BE A MEMBER Call for store hours  Being a woman you may not have the typical symptoms of a heart attack.  You may not have any pain OR you may have atypical pain such as jaw pain, upper back pain, arm pain, "my bra feels to tight" and you will often have symptoms with it like below.  Symptoms for a heart attack will likely occur when you exert your self or exercise and include: Shortness of breath Sweating Nausea Dizziness Fast or irregular heart beats Fatigue   It makes me feel better if my patients get their heart rate up with exercise once or twice a week and pay close attention to your body. If there is ANY change in your exercise capacity or if you have symptoms above, please STOP and call 911 or call to come to the office.   Here is some information to help you keep your heart healthy: Move it! - Aim for 30 mins of activity every day. Take it slowly at first. Talk to [place] before starting any new exercise program.   Lose it.  -Body Mass Index (BMI) can indicate if you need to lose weight. A healthy range is 18.5-24.9. For a BMI calculator, go to [email or web address]  Waist Management -Excess abdominal fat is a risk factor for heart disease, diabetes, asthma, stroke and more. Ideal waist  circumference is less than 35" for women and less than 40" for men.   Eat Right -focus on fruits, vegetables, whole grains, and meals you make yourself. Avoid foods with trans fat and high sugar/sodium content.   Snooze or Snore? - Loud snoring can be a sign of sleep apnea, a significant risk factor for high blood pressure, heart attach, stroke, and heart arrhythmias.  Kick the habit -Quit Smoking! Avoid second hand smoke. A single cigarette raises your blood pressure for 20 mins and increases the risk of heart attack and stroke for the next 24 hours.   Are Aspirin and Supplements right for you? -Add ENTERIC COATED low dose 81 mg Aspirin daily OR can do every other day if you have easy bruising to protect your heart and head. As well as to reduce risk of Colon Cancer by 20 %, Skin Cancer by 26 % , Melanoma by 46% and Pancreatic cancer by 60%  Say "No to Stress -There may be little you can do about problems that cause stress. However, techniques such as long walks, meditation, and exercise can help you manage it.   Start Now! - Make changes one at a time and set reasonable goals to increase your likelihood of success.

## 2017-09-20 ENCOUNTER — Telehealth: Payer: Self-pay | Admitting: Physician Assistant

## 2017-09-20 NOTE — Telephone Encounter (Signed)
Patient thinks leg pain may be from simvastatin/cholesterol pill- stop for 1 month and schedule OV follow up. We may try different cholesterol med in the future, low dose and less.   See if this helps.

## 2017-09-20 NOTE — Telephone Encounter (Signed)
Pt is aware.  

## 2017-09-20 NOTE — Telephone Encounter (Signed)
Lvm return call

## 2017-09-22 ENCOUNTER — Encounter: Payer: Self-pay | Admitting: Physician Assistant

## 2017-10-10 ENCOUNTER — Encounter: Payer: Self-pay | Admitting: Physician Assistant

## 2017-10-10 ENCOUNTER — Other Ambulatory Visit (INDEPENDENT_AMBULATORY_CARE_PROVIDER_SITE_OTHER): Payer: Medicare HMO

## 2017-10-10 ENCOUNTER — Ambulatory Visit: Payer: Medicare HMO | Admitting: Physician Assistant

## 2017-10-10 VITALS — BP 128/78 | HR 60 | Ht 62.0 in | Wt 127.6 lb

## 2017-10-10 DIAGNOSIS — R195 Other fecal abnormalities: Secondary | ICD-10-CM | POA: Diagnosis not present

## 2017-10-10 DIAGNOSIS — Z1211 Encounter for screening for malignant neoplasm of colon: Secondary | ICD-10-CM

## 2017-10-10 DIAGNOSIS — R197 Diarrhea, unspecified: Secondary | ICD-10-CM

## 2017-10-10 LAB — SEDIMENTATION RATE: Sed Rate: 8 mm/hr (ref 0–30)

## 2017-10-10 LAB — IGA: IgA: 65 mg/dL — ABNORMAL LOW (ref 68–378)

## 2017-10-10 LAB — HIGH SENSITIVITY CRP: CRP, High Sensitivity: 1.49 mg/L (ref 0.000–5.000)

## 2017-10-10 MED ORDER — DICYCLOMINE HCL 10 MG PO CAPS: 10.0000 mg | ORAL_CAPSULE | Freq: Three times a day (TID) | ORAL | 3 refills | Status: DC

## 2017-10-10 MED ORDER — PEG 3350-KCL-NA BICARB-NACL 420 G PO SOLR: 4000.0000 mL | Freq: Once | ORAL | 0 refills | Status: AC

## 2017-10-10 NOTE — Progress Notes (Signed)
I agree with the above note, plan 

## 2017-10-10 NOTE — Patient Instructions (Addendum)
Your provider has requested that you go to the basement level for lab work before leaving today. Press "B" on the elevator. The lab is located at the first door on the left as you exit the elevator. You have been scheduled for a colonoscopy. Please follow written instructions given to you at your visit today.  Please pick up your prep supplies at the pharmacy within the next 1-3 days.  [place]. If you use inhalers (even only as needed), please bring them with you on the day of your procedure.  We have sent the following medications to your pharmacy for you to pick up at your convenience: Madalyn Rob. 1. Bentyl 10 mg 2. Golytely    If you are age 51 or older, your body mass index should be between 23-30. Your Body mass index is 23.34 kg/m. If this is out of the aforementioned range listed, please consider follow up with your Primary Care Provider.

## 2017-10-10 NOTE — Progress Notes (Signed)
Subjective:    Patient ID: Ashley Mayer, female    DOB: 1941-04-23, 77 y.o.   MRN: [id number]  HPI Ashley Mayer is a pleasant 77 year old white female, known remotely to Dr. Delfin Edis who comes in today to discuss follow-up colonoscopy and also with concerns about dark stool and altered bowel habits. She had colonoscopy in April 2009 which showed sigmoid diverticulosis with a somewhat tortuous narrowed lumen, no polyps. Patient has history of hypertension, IBS, she status post cholecystectomy and has history of traumatic brain injury. Patient relates that she had seen some dark pieces of stool on a couple of occasions over the past several weeks.  There was no overt melena or blood clots noted.  She has not seen any dark stool over the past 2 weeks.  Patient denies any ongoing abdominal pain.  Appetite has been good and weight has been stable. She mentions that she has significant intolerance to dairy wheat tomatoes and shrimp.  She only consumes Lactaid milk.  She has been avoiding wheat bread and pastas over the past several years due to intolerance.  If she eats any week containing products she seems to get very gassy bloated and "raw" irritated type of abdominal discomfort and loose stools.  She does not believe she is ever been tested for celiac disease.  She will have frequent episodes of urgency and loose stool depending on her intake.  Review of Systems.Pertinent positive and negative review of systems were noted in the above HPI section.  All other review of systems was otherwise negative.  Outpatient Encounter Medications as of 10/10/2017  Medication Sig  . ALPRAZolam (XANAX) 0.5 MG tablet Take 0.5 mg by mouth at bedtime as needed for anxiety.  Marland Kitchen aspirin 81 MG tablet Take 81 mg by mouth daily.  . benzonatate (TESSALON PERLES) 100 MG capsule Take 1 capsule (100 mg total) by mouth every 6 (six) hours as needed for cough.  . Calcium Carbonate-Vitamin D (CALCIUM + D PO) Take 1 tablet by  mouth daily.   . Calcium-Vitamin D-Vitamin K (VIACTIV) [id number] MG-UNT-MCG CHEW Chew by mouth. Takes 1 gummy daily  . Coenzyme Q10 (CO Q-10) 100 MG CAPS Take by mouth.  . estradiol (ESTRACE) 1 MG tablet 1/2 pill BID for vasomotor symptoms  . fluticasone (FLONASE) 50 MCG/ACT nasal spray Place 2 sprays into both nostrils daily.  . meloxicam (MOBIC) 15 MG tablet Take one daily with food for 2 weeks, can take with tylenol, can not take with aleve, iburpofen, then as needed daily for pain  . multivitamin-lutein (OCUVITE-LUTEIN) CAPS Take 1 capsule by mouth daily.  Marland Kitchen OVER THE COUNTER MEDICATION Patient rotates OTC Nexium ,Tagamet and Prevacid for indigestion.  . Probiotic Product (PROBIOTIC DAILY PO) Take by mouth.  . simvastatin (ZOCOR) 20 MG tablet TAKE 1 TABLET BY MOUTH AT BEDTIME  . telmisartan (MICARDIS) 80 MG tablet Take 1/2 to 1 tablet daily as directed for BP  . Zinc 50 MG TABS Take 1 tablet by mouth daily.  Marland Kitchen dicyclomine (BENTYL) 10 MG capsule Take 1 capsule (10 mg total) by mouth 4 (four) times daily -  before meals and at bedtime.  . polyethylene glycol-electrolytes (NULYTELY/GOLYTELY) 420 g solution Take 4,000 mLs by mouth once for 1 dose.   No facility-administered encounter medications on file as of 10/10/2017.    Allergies  Allergen Reactions  . Codeine Other (See Comments)    hallucinations  . Lactose Intolerance (Gi) Diarrhea  . Tomato Diarrhea  . Wheat Bran Diarrhea  .  Morphine And Related Other (See Comments)    Unknown-family history of allergy  . Tape Itching and Rash   Patient Active Problem List   Diagnosis Date Noted  . Osteopenia 06/19/2015  . History of traumatic brain injury 10/11/2014  . Vitamin D deficiency 08/15/2013  . Other abnormal glucose 08/15/2013  . Medication management 08/15/2013  . Mixed hyperlipidemia 12/21/2007  . Essential hypertension 12/21/2007  . Diverticulosis of large intestine 12/21/2007  . IBS (irritable bowel syndrome) 12/21/2007    Social History   Socioeconomic History  . Marital status: Single    Spouse name: Not on file  . Number of children: Not on file  . Years of education: Not on file  . Highest education level: Not on file  Occupational History  . Not on file  Social Needs  . Financial resource strain: Not on file  . Food insecurity:    Worry: Not on file    Inability: Not on file  . Transportation needs:    Medical: Not on file    Non-medical: Not on file  Tobacco Use  . Smoking status: Never Smoker  . Smokeless tobacco: Never Used  Substance and Sexual Activity  . Alcohol use: No  . Drug use: No  . Sexual activity: Never  Lifestyle  . Physical activity:    Days per week: Not on file    Minutes per session: Not on file  . Stress: Not on file  Relationships  . Social connections:    Talks on phone: Not on file    Gets together: Not on file    Attends religious service: Not on file    Active member of club or organization: Not on file    Attends meetings of clubs or organizations: Not on file    Relationship status: Not on file  . Intimate partner violence:    Fear of current or ex partner: Not on file    Emotionally abused: Not on file    Physically abused: Not on file    Forced sexual activity: Not on file  Other Topics Concern  . Not on file  Social History Narrative  . Not on file    Ms. Hemm's family history includes Asthma in her other; Cancer in her brother, father, and other; Diabetes in her sister; Heart attack in her other; Heart disease in her brother; Hemochromatosis in her son; Hyperlipidemia in her brother, mother, and other; Hypertension in her brother and other; Stroke in her brother, mother, and other.      Objective:    Vitals:   10/10/17 0948  BP: 128/78  Pulse: 60    Physical Exam; well-developed elderly white female in no acute distress, very pleasant blood pressure 120/78 pulse 60, height 5 foot 2, weight 127, BMI 23.3.  HEENT ;nontraumatic  normocephalic EOMI PERRLA sclera anicteric, nasopharynx benign, neck supple Cardiovascular; regular rate and rhythm with S1-S2 no murmur rub or gallop.  Pulmonary; clear bilaterally, Abdomen; soft, nontender nondistended bowel sounds are active there is no palpable mass or hepatosplenomegaly, Rectal; exam not done, Extremities; no clubbing cyanosis or edema skin warm and dry, Neuro/psych; alert and oriented x4, grossly nonfocal mood and affect appropriate       Assessment & Plan:   #85 77 year old white female due for colon cancer surveillance-last colonoscopy April 2009 pertinent for sigmoid diverticulosis, no history of polyps #2 recent concern for dark stool on 2 occasions.  No overt melena or hematochezia.  Recent CBC normal. #3.  Intolerance  to dairy ,wheat ,tomato and shrimp with significant GI symptoms.  She has frequent episodes of gassiness bloating abdominal discomfort and loose stool. Patient is felt to have IBS, rule out celiac disease #4 status post cholecystectomy  Plan; She will be scheduled for colonoscopy with Dr. Ardis Hughs.  Procedure was discussed in detail with the patient including indications risks and benefits and she is agreeable to proceed. Advised to continue to avoid lactose, and wheat and other known triggers. We will give her a trial of Bentyl 10 mg to be used 1-3 times daily half an hour before meals. Also advised she try Beano or Gas-X as needed Check TTG, IgA sed rate and CRP   Leinaala Catanese S Aqueelah Cotrell PA-C 10/10/2017   Cc: Unk Pinto, MD

## 2017-10-11 LAB — TISSUE TRANSGLUTAMINASE, IGA: (TTG) AB, IGA: 1 U/mL

## 2017-10-11 NOTE — Progress Notes (Signed)
Assessment and Plan:  Essential hypertension Will recheck kidney function Diarrhea has improved some Will switch to 20 mg once a day -     COMPLETE METABOLIC PANEL WITH GFR  Mixed hyperlipidemia Get back on simvastatin if any leg pain with it will switch to pravastatin versus low dose crestor  Diarrhea, unspecified type Discussed gluten free diet, will get labs for GI so she does not have to go back there per patient request.  -     Ehrlichia antibody panel -     IGG -     Celiac Pnl 2 rflx Endomysial Ab Ttr -     Tissue Transglutaminase Abs,IgG,IgA   Future Appointments  Date Time Provider [place]  11/25/2017  4:00 PM Milus Banister, MD [facility]  03/10/2018  9:30 AM Vicie Mutters, PA-C [facility] None  09/14/2018  9:00 AM Vicie Mutters, PA-C [facility] None     HPI 77 y.o.female presents for follow up for BP and cholesterol.   She has been having AB soreness/gramping for years but getting worse, saw Amy Esterwood PA-C and had slightly abnormal IgA and going to return for further test in their office to rule out celiacs She will also be scheduled for a colonoscopy. She states she is not eating much and she continues to have AB pain. She is mainly eating meat and veggies.   She is on micardis 80 mg and states she has to cut it into to 1/4 and tak only 20 mg or else it is too much. She would like to just have 20 mg daily after this runs out.  She stopped the simvastatin due to muscle aches but thinks that the micardis may have contributed to it instead.   Lab Results  Component Value Date   CHOL 182 09/08/2017   HDL 54 09/08/2017   LDLCALC 97 09/08/2017   TRIG 224 (H) 09/08/2017   CHOLHDL 3.4 09/08/2017    Blood pressure 110/60, resp. rate 14, height 5\' 2"  (1.575 m), weight 128 lb 3.2 oz (58.2 kg).    Past Medical History:  Diagnosis Date  . Arthritis    "hands" (09/17/2013)  . GERD (gastroesophageal reflux disease)   . Hyperlipidemia   .  Hypertension   . IBS (irritable bowel syndrome)   . Multiple food allergies   . Osteopenia   . Unspecified vitamin D deficiency      Allergies  Allergen Reactions  . Codeine Other (See Comments)    hallucinations  . Lactose Intolerance (Gi) Diarrhea  . Tomato Diarrhea  . Wheat Bran Diarrhea  . Morphine And Related Other (See Comments)    Unknown-family history of allergy  . Tape Itching and Rash    Current Outpatient Medications on File Prior to Visit  Medication Sig  . ALPRAZolam (XANAX) 0.5 MG tablet Take 0.5 mg by mouth at bedtime as needed for anxiety.  Marland Kitchen aspirin 81 MG tablet Take 81 mg by mouth daily.  . benzonatate (TESSALON PERLES) 100 MG capsule Take 1 capsule (100 mg total) by mouth every 6 (six) hours as needed for cough.  . Calcium Carbonate-Vitamin D (CALCIUM + D PO) Take 1 tablet by mouth daily.   . Calcium-Vitamin D-Vitamin K (VIACTIV) [phone] MG-UNT-MCG CHEW Chew by mouth. Takes 1 gummy daily  . Coenzyme Q10 (CO Q-10) 100 MG CAPS Take by mouth.  . dicyclomine (BENTYL) 10 MG capsule Take 1 capsule (10 mg total) by mouth 4 (four) times daily -  before meals and at  bedtime.  Marland Kitchen estradiol (ESTRACE) 1 MG tablet 1/2 pill BID for vasomotor symptoms  . fluticasone (FLONASE) 50 MCG/ACT nasal spray Place 2 sprays into both nostrils daily.  . meloxicam (MOBIC) 15 MG tablet Take one daily with food for 2 weeks, can take with tylenol, can not take with aleve, iburpofen, then as needed daily for pain  . multivitamin-lutein (OCUVITE-LUTEIN) CAPS Take 1 capsule by mouth daily.  Marland Kitchen OVER THE COUNTER MEDICATION Patient rotates OTC Nexium ,Tagamet and Prevacid for indigestion.  . Probiotic Product (PROBIOTIC DAILY PO) Take by mouth.  . simvastatin (ZOCOR) 20 MG tablet TAKE 1 TABLET BY MOUTH AT BEDTIME  . telmisartan (MICARDIS) 80 MG tablet Take 1/2 to 1 tablet daily as directed for BP  . Zinc 50 MG TABS Take 1 tablet by mouth daily.   No current facility-administered medications on  file prior to visit.     ROS: all negative except above.   Physical Exam: There were no vitals filed for this visit. There were no vitals taken for this visit. General Appearance: Well nourished, in no apparent distress. Eyes: PERRLA, EOMs, conjunctiva no swelling or erythema Sinuses: No Frontal/maxillary tenderness ENT/Mouth: Ext aud canals clear, TMs without erythema, bulging. No erythema, swelling, or exudate on post pharynx.  Tonsils not swollen or erythematous. Hearing normal.  Neck: Supple, thyroid normal.  Respiratory: Respiratory effort normal, BS equal bilaterally without rales, rhonchi, wheezing or stridor.  Cardio: RRR with no MRGs. Brisk peripheral pulses without edema.  Abdomen: Soft, + BS.  Non tender, no guarding, rebound, hernias, masses. Lymphatics: Non tender without lymphadenopathy.  Musculoskeletal: Full ROM, 5/5 strength, normal gait.  Skin: Warm, dry without rashes, lesions, ecchymosis.  Neuro: Cranial nerves intact. Normal muscle tone, no cerebellar symptoms. Sensation intact.  Psych: Awake and oriented X 3, normal affect, Insight and Judgment appropriate.     Vicie Mutters, PA-C 12:20 PM [facility]

## 2017-10-12 ENCOUNTER — Other Ambulatory Visit: Payer: Self-pay

## 2017-10-12 DIAGNOSIS — R197 Diarrhea, unspecified: Secondary | ICD-10-CM

## 2017-10-13 ENCOUNTER — Ambulatory Visit (INDEPENDENT_AMBULATORY_CARE_PROVIDER_SITE_OTHER): Payer: Medicare HMO | Admitting: Physician Assistant

## 2017-10-13 ENCOUNTER — Encounter: Payer: Self-pay | Admitting: Physician Assistant

## 2017-10-13 ENCOUNTER — Ambulatory Visit: Payer: Self-pay

## 2017-10-13 ENCOUNTER — Other Ambulatory Visit: Payer: Self-pay

## 2017-10-13 VITALS — BP 110/60 | Resp 14 | Ht 62.0 in | Wt 128.2 lb

## 2017-10-13 DIAGNOSIS — R7309 Other abnormal glucose: Secondary | ICD-10-CM

## 2017-10-13 DIAGNOSIS — M779 Enthesopathy, unspecified: Principal | ICD-10-CM

## 2017-10-13 DIAGNOSIS — R197 Diarrhea, unspecified: Secondary | ICD-10-CM

## 2017-10-13 DIAGNOSIS — I10 Essential (primary) hypertension: Secondary | ICD-10-CM | POA: Diagnosis not present

## 2017-10-13 DIAGNOSIS — M778 Other enthesopathies, not elsewhere classified: Secondary | ICD-10-CM

## 2017-10-13 DIAGNOSIS — E782 Mixed hyperlipidemia: Secondary | ICD-10-CM | POA: Diagnosis not present

## 2017-10-13 DIAGNOSIS — Z79899 Other long term (current) drug therapy: Secondary | ICD-10-CM

## 2017-10-13 MED ORDER — TELMISARTAN 20 MG PO TABS: ORAL_TABLET | ORAL | 1 refills | Status: DC

## 2017-10-13 MED ORDER — ESTRADIOL 1 MG PO TABS: ORAL_TABLET | ORAL | 1 refills | Status: DC

## 2017-10-13 MED ORDER — MELOXICAM 15 MG PO TABS: ORAL_TABLET | ORAL | 1 refills | Status: DC

## 2017-10-13 NOTE — Patient Instructions (Signed)
Will send in the micardis 20 mg to take once a daily  Can restart the simvastatin BUT if you start to have muscle cramps please stop and let us know and we will send in a completely different cholesterol medication.

## 2017-10-18 ENCOUNTER — Telehealth: Payer: Self-pay | Admitting: Physician Assistant

## 2017-10-19 ENCOUNTER — Other Ambulatory Visit: Payer: Self-pay

## 2017-10-19 ENCOUNTER — Telehealth: Payer: Self-pay

## 2017-10-19 LAB — CELIAC PNL 2 RFLX ENDOMYSIAL AB TTR
(tTG) Ab, IgA: <1 U/mL
Endomysial Ab IgA: NEGATIVE
GLIADIN(DEAM) AB,IGA: 3 U (ref <20)
Gliadin(Deam) Ab,IgG: 11 U (ref <20)
IMMUNOGLOBULIN A: 72 mg/dL — AB (ref 81–463)

## 2017-10-19 LAB — COMPLETE METABOLIC PANEL WITH GFR
AG RATIO: 2 (calc) (ref 1.0–2.5)
ALT: 16 U/L (ref 6–29)
AST: 23 U/L (ref 10–35)
Albumin: 4.4 g/dL (ref 3.6–5.1)
Alkaline phosphatase (APISO): 65 U/L (ref 33–130)
BILIRUBIN TOTAL: 0.6 mg/dL (ref 0.2–1.2)
BUN/Creatinine Ratio: 18 (calc) (ref 6–22)
BUN: 18 mg/dL (ref 7–25)
CALCIUM: 10 mg/dL (ref 8.6–10.4)
CHLORIDE: 102 mmol/L (ref 98–110)
CO2: 28 mmol/L (ref 20–32)
Creat: 1.01 mg/dL — ABNORMAL HIGH (ref 0.60–0.93)
GFR, Est African American: 62 mL/min/{1.73_m2} (ref >=60)
GFR, Est Non African American: 54 mL/min/{1.73_m2} — ABNORMAL LOW (ref >=60)
GLUCOSE: 85 mg/dL (ref 65–99)
Globulin: 2.2 g/dL (calc) (ref 1.9–3.7)
POTASSIUM: 4.8 mmol/L (ref 3.5–5.3)
Sodium: 136 mmol/L (ref 135–146)
TOTAL PROTEIN: 6.6 g/dL (ref 6.1–8.1)

## 2017-10-19 LAB — TISSUE TRANSGLUTAMINASE ABS,IGG,IGA
(TTG) AB, IGA: 1 U/mL
(tTG) Ab, IgG: 2 U/mL

## 2017-10-19 LAB — EHRLICHIA ANTIBODY PANEL
E. CHAFFEENSIS AB IGG: <1:64 {titer}
E. CHAFFEENSIS AB IGM: <1:20 {titer}

## 2017-10-19 LAB — IGG: IgG (Immunoglobin G), Serum: 911 mg/dL (ref 694–1618)

## 2017-10-19 NOTE — Telephone Encounter (Signed)
Left message to call.

## 2017-10-19 NOTE — Telephone Encounter (Signed)
Patient returning phone call best call back # [phone].

## 2017-10-19 NOTE — Telephone Encounter (Signed)
Pt has been made aware of lab results. Pt reports that she is going to have a colonoscopy on May 15th 2019 at 3:43pm by DD

## 2017-10-19 NOTE — Telephone Encounter (Signed)
Patient wants an appointment for her colonoscopy sooner than she is presently scheduled. New instructions will be needed also. New appointment 10/28/17. Per her request the new instructions will be mailed to her. Brief review of the changes.

## 2017-10-28 ENCOUNTER — Ambulatory Visit (AMBULATORY_SURGERY_CENTER): Payer: Medicare HMO | Admitting: Gastroenterology

## 2017-10-28 ENCOUNTER — Encounter: Payer: Self-pay | Admitting: Gastroenterology

## 2017-10-28 ENCOUNTER — Other Ambulatory Visit: Payer: Self-pay

## 2017-10-28 VITALS — BP 115/55 | HR 51 | Temp 98.7°F | Resp 15 | Ht 62.0 in | Wt 127.0 lb

## 2017-10-28 DIAGNOSIS — I10 Essential (primary) hypertension: Secondary | ICD-10-CM | POA: Diagnosis not present

## 2017-10-28 DIAGNOSIS — Z860100 Personal history of colon polyps, unspecified: Secondary | ICD-10-CM

## 2017-10-28 DIAGNOSIS — R197 Diarrhea, unspecified: Secondary | ICD-10-CM

## 2017-10-28 DIAGNOSIS — K52831 Collagenous colitis: Secondary | ICD-10-CM | POA: Diagnosis not present

## 2017-10-28 DIAGNOSIS — Z1211 Encounter for screening for malignant neoplasm of colon: Secondary | ICD-10-CM | POA: Diagnosis not present

## 2017-10-28 MED ORDER — SODIUM CHLORIDE 0.9 % IV SOLN: 500.0000 mL | Freq: Once | INTRAVENOUS | Status: DC

## 2017-10-28 MED ORDER — SODIUM CHLORIDE 0.9 % IV SOLN
4.0000 mg | Freq: Once | INTRAVENOUS | Status: AC
  Administered 2017-10-28: 4 mg via INTRAVENOUS

## 2017-10-28 NOTE — Patient Instructions (Addendum)
  Resume current medications and diet.    YOU HAD AN ENDOSCOPIC PROCEDURE TODAY AT [facility]:   Refer to the procedure report that was given to you for any specific questions about what was found during the examination.  If the procedure report does not answer your questions, please call your gastroenterologist to clarify.  If you requested that your care partner not be given the details of your procedure findings, then the procedure report has been included in a sealed envelope for you to review at your convenience later.  YOU SHOULD EXPECT: Some feelings of bloating in the abdomen. Passage of more gas than usual.  Walking can help get rid of the air that was put into your GI tract during the procedure and reduce the bloating. If you had a lower endoscopy (such as a colonoscopy or flexible sigmoidoscopy) you may notice spotting of blood in your stool or on the toilet paper. If you underwent a bowel prep for your procedure, you may not have a normal bowel movement for a few days.  Please Note:  You might notice some irritation and congestion in your nose or some drainage.  This is from the oxygen used during your procedure.  There is no need for concern and it should clear up in a day or so.  SYMPTOMS TO REPORT IMMEDIATELY:   Following lower endoscopy (colonoscopy or flexible sigmoidoscopy):  Excessive amounts of blood in the stool  Significant tenderness or worsening of abdominal pains  Swelling of the abdomen that is new, acute  Fever of 100F or higher    For urgent or emergent issues, a gastroenterologist can be reached at any hour by calling [phone].   DIET:  We do recommend a small meal at first, but then you may proceed to your regular diet.  Drink plenty of fluids but you should avoid alcoholic beverages for 24 hours.  ACTIVITY:  You should plan to take it easy for the rest of today and you should NOT DRIVE or use heavy machinery until tomorrow (because of  the sedation medicines used during the test).    FOLLOW UP: Our staff will call the number listed on your records the next business day following your procedure to check on you and address any questions or concerns that you may have regarding the information given to you following your procedure. If we do not reach you, we will leave a message.  However, if you are feeling well and you are not experiencing any problems, there is no need to return our call.  We will assume that you have returned to your regular daily activities without incident.  If any biopsies were taken you will be contacted by phone or by letter within the next 1-3 weeks.  Please call us at [phone] if you have not heard about the biopsies in 3 weeks.    SIGNATURES/CONFIDENTIALITY: You and/or your care partner have signed paperwork which will be entered into your electronic medical record.  These signatures attest to the fact that that the information above on your After Visit Summary has been reviewed and is understood.  Full responsibility of the confidentiality of this discharge information lies with you and/or your care-partner.

## 2017-10-28 NOTE — Progress Notes (Signed)
Pt. Reports no change in her medical or surgical history since her pre-visit 10/10/2017.  Pt. Presently has some nausea since she began her prep yesterday.

## 2017-10-28 NOTE — Progress Notes (Signed)
Report to PACU, RN, vss, BBS= Clear.  

## 2017-10-28 NOTE — Progress Notes (Signed)
Called to room to assist during endoscopic procedure.  Patient ID and intended procedure confirmed with present staff. Received instructions for my participation in the procedure from the performing physician.  

## 2017-10-28 NOTE — Op Note (Signed)
[facility] Patient Name: Ashley Mayer Procedure Date: 10/28/2017 10:27 AM MRN: [id number] Endoscopist: Milus Banister , MD Age: 77 Referring MD:  Date of Birth: [birth year] Gender: Female Account #: [id number] Procedure:                Colonoscopy Indications:              loose stools intermittently Medicines:                Monitored Anesthesia Care Procedure:                Pre-Anesthesia Assessment:                           - Prior to the procedure, a History and Physical                            was performed, and patient medications and                            allergies were reviewed. The patient's tolerance of                            previous anesthesia was also reviewed. The risks                            and benefits of the procedure and the sedation                            options and risks were discussed with the patient.                            All questions were answered, and informed consent                            was obtained. Prior Anticoagulants: The patient has                            taken no previous anticoagulant or antiplatelet                            agents. ASA Grade Assessment: II - A patient with                            mild systemic disease. After reviewing the risks                            and benefits, the patient was deemed in                            satisfactory condition to undergo the procedure.                           After obtaining informed consent, the colonoscope  was passed under direct vision. Throughout the                            procedure, the patient's blood pressure, pulse, and                            oxygen saturations were monitored continuously. The                            Colonoscope was introduced through the anus and                            advanced to the the cecum, identified by                            appendiceal orifice and ileocecal  valve. The                            colonoscopy was performed without difficulty. The                            patient tolerated the procedure well. The quality                            of the bowel preparation was good. The ileocecal                            valve, appendiceal orifice, and rectum were                            photographed. Scope In: 10:42:51 AM Scope Out: 10:51:38 AM Scope Withdrawal Time: 0 hours 5 minutes 15 seconds  Total Procedure Duration: 0 hours 8 minutes 47 seconds  Findings:                 Biopsies for histology were taken with a cold                            forceps from the entire colon for evaluation of                            microscopic colitis.                           The entire examined colon appeared normal on direct                            and retroflexion views. Complications:            No immediate complications. Estimated blood loss:                            None. Estimated Blood Loss:     Estimated blood loss: none. Impression:               - The entire examined colon is normal on  direct and                            retroflexion views.                           - Biopsies were taken with a cold forceps from the                            entire colon for evaluation of microscopic colitis. Recommendation:           - Patient has a contact number available for                            emergencies. The signs and symptoms of potential                            delayed complications were discussed with the                            patient. Return to normal activities tomorrow.                            Written discharge instructions were provided to the                            patient.                           - Resume previous diet.                           - Continue present medications.                           - Await pathology results. If no microscopic                            colitis is found, will  consider trial of                            cholestyramine (for ? bile acid related dyspepsia,                            loose stools). Milus Banister, MD 10/28/2017 10:55:51 AM This report has been signed electronically.

## 2017-11-01 ENCOUNTER — Telehealth: Payer: Self-pay | Admitting: *Deleted

## 2017-11-01 NOTE — Telephone Encounter (Signed)
  Follow up Call-  Call back number 10/28/2017  Post procedure Call Back phone  # [phone]  Permission to leave phone message Yes  Some recent data might be hidden     Patient questions:  Do you have a fever, pain , or abdominal swelling? No. Pain Score  0 *  Have you tolerated food without any problems? Yes.    Have you been able to return to your normal activities? Yes.    Do you have any questions about your discharge instructions: Diet   No. Medications  No. Follow up visit  No.  Do you have questions or concerns about your Care? No.  Actions: * If pain score is 4 or above: No action needed, pain <4.

## 2017-11-04 ENCOUNTER — Other Ambulatory Visit: Payer: Self-pay

## 2017-11-04 MED ORDER — BUDESONIDE 3 MG PO CPEP: 9.0000 mg | ORAL_CAPSULE | Freq: Every day | ORAL | 3 refills | Status: AC

## 2017-11-07 ENCOUNTER — Telehealth: Payer: Self-pay | Admitting: Gastroenterology

## 2017-11-07 NOTE — Telephone Encounter (Signed)
Patient states she has a question regarding a medication budesonide. Patient had procedure on 5.24.19.

## 2017-11-08 NOTE — Telephone Encounter (Signed)
Patient calling back about medication stating it is too expensive and wants to know if there is something else that could be called in.

## 2017-11-10 ENCOUNTER — Telehealth: Payer: Self-pay | Admitting: Gastroenterology

## 2017-11-10 NOTE — Telephone Encounter (Signed)
She should try imodium OTC one pill once daily (every morning) instead.  Still rov in 6 weeks.  thanks

## 2017-11-10 NOTE — Telephone Encounter (Signed)
The pt misunderstood and thought she needed a nutritionist.  I advised her that she can eat any food that does not cause discomfort.  She will call back with any problems or concerns

## 2017-11-11 NOTE — Telephone Encounter (Signed)
Spoke to patient she will try OTC Imodium. She will keep her office visit for August.

## 2017-11-25 ENCOUNTER — Encounter: Payer: Medicare HMO | Admitting: Gastroenterology

## 2017-12-07 ENCOUNTER — Encounter: Payer: Self-pay | Admitting: Physician Assistant

## 2017-12-19 ENCOUNTER — Other Ambulatory Visit: Payer: Self-pay | Admitting: Internal Medicine

## 2017-12-19 MED ORDER — ALPRAZOLAM 0.5 MG PO TABS: ORAL_TABLET | ORAL | 0 refills | Status: DC

## 2018-01-10 ENCOUNTER — Encounter (INDEPENDENT_AMBULATORY_CARE_PROVIDER_SITE_OTHER): Payer: Self-pay

## 2018-01-10 ENCOUNTER — Ambulatory Visit (INDEPENDENT_AMBULATORY_CARE_PROVIDER_SITE_OTHER): Payer: Medicare HMO | Admitting: Gastroenterology

## 2018-01-10 ENCOUNTER — Encounter: Payer: Self-pay | Admitting: Gastroenterology

## 2018-01-10 VITALS — BP 140/66 | HR 64 | Ht 62.0 in | Wt 131.0 lb

## 2018-01-10 DIAGNOSIS — K52831 Collagenous colitis: Secondary | ICD-10-CM | POA: Diagnosis not present

## 2018-01-10 NOTE — Patient Instructions (Addendum)
Call if you have further trouble with your bowels.  Normal BMI (Body Mass Index- based on height and weight) is between 23 and 30. Your BMI today is Body mass index is 23.96 kg/m. Marland Kitchen Please consider follow up  regarding your BMI with your Primary Care Provider.

## 2018-01-10 NOTE — Progress Notes (Signed)
Review of pertinent gastrointestinal problems: 1.  Routine risk for colon cancer.  Colonoscopy  May 2019 was completely normal.  No polyps or cancers.  No further colon cancer screening is needed given age equals 18. 2.  Collagenous colitis.  Chronic loose stools, eventual colonoscopy May 2019 with random colon biopsies.  Biopsies suggest collagenous colitis.  Labs 2019 sed rate normal, celiac panel essentially negative.   HPI: This is a 59 very pleasant 77 year old woman whom I last saw the time of a colonoscopy 3 or 4 months ago.  I diagnosed her with collagenous colitis and recommended budesonide 9 mg once daily.  She could not afford it and so instead I recommended a single Imodium every morning.  She did not try the Imodium.  Instead she is simply avoiding wheat and dairy and as long as she avoids them she feels fine.  She avoids them and is happy with her bowels.   Chief complaint is collagenous colitis  ROS: complete GI ROS as described in HPI, all other review negative.  Constitutional:  No unintentional weight loss   Past Medical History:  Diagnosis Date  . Arthritis    "hands" (09/17/2013)  . GERD (gastroesophageal reflux disease)   . Hyperlipidemia   . Hypertension   . IBS (irritable bowel syndrome)   . Multiple food allergies   . Osteopenia   . Unspecified vitamin D deficiency     Past Surgical History:  Procedure Laterality Date  . ANTERIOR AND POSTERIOR REPAIR  05/2009  . BLADDER SUSPENSION  05/2009  . CATARACT EXTRACTION W/ INTRAOCULAR LENS  IMPLANT, BILATERAL Bilateral 2000's  . CHOLECYSTECTOMY  2000's  . VAGINAL HYSTERECTOMY  ~ 1977    Current Outpatient Medications  Medication Sig Dispense Refill  . ALPRAZolam (XANAX) 0.5 MG tablet Take 1/2 to 1 tablet at hour of sleep only if needed. 90 tablet 0  . aspirin 81 MG tablet Take 81 mg by mouth daily.    . benzonatate (TESSALON PERLES) 100 MG capsule Take 1 capsule (100 mg total) by mouth every 6 (six) hours as  needed for cough. 60 capsule 0  . Coenzyme Q10 (CO Q-10) 100 MG CAPS Take by mouth.    . dicyclomine (BENTYL) 10 MG capsule Take 1 capsule (10 mg total) by mouth 4 (four) times daily -  before meals and at bedtime. 90 capsule 3  . estradiol (ESTRACE) 1 MG tablet 1/2 pill BID for vasomotor symptoms 180 tablet 1  . fluticasone (FLONASE) 50 MCG/ACT nasal spray Place 2 sprays into both nostrils daily. 16 g 0  . meloxicam (MOBIC) 15 MG tablet Take one daily with food for 2 weeks, can take with tylenol, can not take with aleve, iburpofen, then as needed daily for pain 90 tablet 1  . multivitamin-lutein (OCUVITE-LUTEIN) CAPS Take 1 capsule by mouth daily.    Marland Kitchen OVER THE COUNTER MEDICATION Patient rotates OTC Nexium ,Tagamet and Prevacid for indigestion.    . Probiotic Product (PROBIOTIC DAILY PO) Take by mouth.    . simvastatin (ZOCOR) 20 MG tablet TAKE 1 TABLET BY MOUTH AT BEDTIME 90 tablet 1  . telmisartan (MICARDIS) 20 MG tablet Take 1/2 to 1 tablet daily as directed for BP 90 tablet 1  . Zinc 50 MG TABS Take 1 tablet by mouth daily.     No current facility-administered medications for this visit.     Allergies as of 01/10/2018 - Review Complete 01/10/2018  Allergen Reaction Noted  . Codeine Other (See Comments) 04/16/2013  .  Lactose intolerance (gi) Diarrhea 10/11/2014  . Tomato Diarrhea 10/11/2014  . Wheat bran Diarrhea 10/11/2014  . Morphine and related Other (See Comments) 10/11/2014  . Tape Itching and Rash 10/11/2014    Family History  Problem Relation Age of Onset  . Stroke Mother   . Hyperlipidemia Mother   . Cancer Father        mesothelioma  . Diabetes Sister   . Heart disease Brother   . Hyperlipidemia Brother   . Hypertension Brother   . Stroke Brother   . Hemochromatosis Son   . Cancer Brother        bladder with mets to kidneys  . Stroke Other   . Hypertension Other   . Hyperlipidemia Other   . Cancer Other   . Asthma Other   . Heart attack Other     Social  History   Socioeconomic History  . Marital status: Single    Spouse name: Not on file  . Number of children: Not on file  . Years of education: Not on file  . Highest education level: Not on file  Occupational History  . Not on file  Social Needs  . Financial resource strain: Not on file  . Food insecurity:    Worry: Not on file    Inability: Not on file  . Transportation needs:    Medical: Not on file    Non-medical: Not on file  Tobacco Use  . Smoking status: Never Smoker  . Smokeless tobacco: Never Used  Substance and Sexual Activity  . Alcohol use: No  . Drug use: No  . Sexual activity: Never  Lifestyle  . Physical activity:    Days per week: Not on file    Minutes per session: Not on file  . Stress: Not on file  Relationships  . Social connections:    Talks on phone: Not on file    Gets together: Not on file    Attends religious service: Not on file    Active member of club or organization: Not on file    Attends meetings of clubs or organizations: Not on file    Relationship status: Not on file  . Intimate partner violence:    Fear of current or ex partner: Not on file    Emotionally abused: Not on file    Physically abused: Not on file    Forced sexual activity: Not on file  Other Topics Concern  . Not on file  Social History Narrative  . Not on file     Physical Exam: BP 140/66   Pulse 64   Ht 5\' 2"  (1.575 m)   Wt 131 lb (59.4 kg)   BMI 23.96 kg/m  Constitutional: generally well-appearing Psychiatric: alert and oriented x3 Abdomen: soft, nontender, nondistended, no obvious ascites, no peritoneal signs, normal bowel sounds No peripheral edema noted in lower extremities  Assessment and plan: 77 y.o. female with collagenous colitis  She could not afford the budesonide.  She did not try daily Imodium.  Instead she is avoiding  wheat and dairy in her diet and on that regimen she is very content with her bowels.  Celiac panel was negative and so I am  not sure why avoiding wheat will help.  Certainly she may have a component of lactose intolerance contributing to some of her intermittent chronic loose stools.  Either way she is happy with her regimen and she knows to call here if she has any further troubles.  Please  see the "Patient Instructions" section for addition details about the plan.  Owens Loffler, MD [facility] 01/10/2018, 2:35 PM

## 2018-02-21 ENCOUNTER — Other Ambulatory Visit: Payer: Self-pay | Admitting: Physician Assistant

## 2018-02-23 ENCOUNTER — Other Ambulatory Visit: Payer: Self-pay | Admitting: Adult Health

## 2018-03-08 NOTE — Progress Notes (Signed)
CPE AND FOLLOW UP   Assessment:    Essential hypertension - continue medications, DASH diet, exercise and monitor at home. Call if greater than 130/80.  - CBC with Differential/Platelet - BASIC METABOLIC PANEL WITH GFR - Hepatic function panel - TSH   Mixed hyperlipidemia -continue medications, check lipids, decrease fatty foods, increase activity.  - Lipid panel  TBI (traumatic brain injury), with loss of consciousness of unspecified duration, subsequent encounter [facility]) patient to go to ER if there is weakness, thunderclap headache, visual changes, or any concerning factors  Vitamin D deficiency - VITAMIN D 25 Hydroxy (Vit-D Deficiency, Fractures)   Medication management - Magnesium   IBS (irritable bowel syndrome) Diet controlled   Diverticulosis of large intestine without hemorrhage Diet controlled, add fiber   Osteopenia , continue vitamin D, will get with her MGM next year      Future Appointments  Date Time Provider [place]  03/10/2018  9:30 AM Vicie Mutters, PA-C [facility] None  09/14/2018  9:00 AM Vicie Mutters, PA-C [facility] None  03/30/2019  9:30 AM Vicie Mutters, PA-C [facility] None     Subjective:   Wilene Pharo Sena is a 77 y.o. female who presents for CPE and 3 month follow up on hypertension, hyperlipidemia, vitamin D def.   Her blood pressure has been controlled at home, today their BP is BP: 120/60 She does workout, constantly out in her yard and very active. She denies chest pain, shortness of breath, dizziness.  She is on cholesterol medication, simvastatin and denies myalgias. Her cholesterol is at goal. The cholesterol last visit was:   Lab Results  Component Value Date   CHOL 182 09/08/2017   HDL 54 09/08/2017   LDLCALC 97 09/08/2017   TRIG 224 (H) 09/08/2017   CHOLHDL 3.4 09/08/2017   Last A1C in the office was:  Lab Results  Component Value Date   HGBA1C 4.9 08/19/2016   Patient is on Vitamin D supplement. Lab  Results  Component Value Date   VD25OH 31 06/15/2017   She is on estrace 1 mg 1/2 pill daily, s/p hysterectomy, she is on bASA, she is back on estrace after trial off due to hot flashes, understands risk.  She takes prilosec for GERD She is on xanax as needed for anxiety.  BMI is Body mass index is 24.26 kg/m., she is working on diet and exercise. Wt Readings from Last 3 Encounters:  03/10/18 128 lb 6.4 oz (58.2 kg)  01/10/18 131 lb (59.4 kg)  10/28/17 127 lb (57.6 kg)    Names of Other Physician/Practitioners you currently use: 2. [facility], last visit 1 year, due to go back 3. , dentist, last visit 2 years, wants new dentist Patient Care Team: Unk Pinto, MD as PCP - General (Internal Medicine) Simona Huh, MD as Consulting Physician (Dermatology) Melina Schools, MD as Consulting Physician (Obstetrics and Gynecology) Lafayette Dragon, MD as Consulting Physician (Gastroenterology)   Medication Review Current Outpatient Medications on File Prior to Visit  Medication Sig Dispense Refill  . ALPRAZolam (XANAX) 0.5 MG tablet Take 1/2 to 1 tablet at hour of sleep only if needed. 90 tablet 0  . aspirin 81 MG tablet Take 81 mg by mouth daily.    . benzonatate (TESSALON) 100 MG capsule TAKE 1 CAPSULE BY MOUTH EVERY 6 HOURS AS NEEDED FOR COUGH 60 capsule 0  . Coenzyme Q10 (CO Q-10) 100 MG CAPS Take by mouth.    . dicyclomine (BENTYL)  10 MG capsule Take 1 capsule (10 mg total) by mouth 4 (four) times daily -  before meals and at bedtime. 90 capsule 3  . estradiol (ESTRACE) 1 MG tablet 1/2 pill BID for vasomotor symptoms 180 tablet 1  . fluticasone (FLONASE) 50 MCG/ACT nasal spray Place 2 sprays into both nostrils daily. 16 g 0  . meloxicam (MOBIC) 15 MG tablet Take one daily with food for 2 weeks, can take with tylenol, can not take with aleve, iburpofen, then as needed daily for pain 90 tablet 1  . multivitamin-lutein (OCUVITE-LUTEIN) CAPS Take 1 capsule  by mouth daily.    Marland Kitchen OVER THE COUNTER MEDICATION Patient rotates OTC Nexium ,Tagamet and Prevacid for indigestion.    . Probiotic Product (PROBIOTIC DAILY PO) Take by mouth.    . simvastatin (ZOCOR) 20 MG tablet TAKE 1 TABLET BY MOUTH AT BEDTIME 90 tablet 1  . telmisartan (MICARDIS) 20 MG tablet Take 1/2 to 1 tablet daily as directed for BP 90 tablet 1  . Zinc 50 MG TABS Take 1 tablet by mouth daily.     No current facility-administered medications on file prior to visit.     Current Problems (verified) Patient Active Problem List   Diagnosis Date Noted  . Osteopenia 06/19/2015  . History of traumatic brain injury 10/11/2014  . Vitamin D deficiency 08/15/2013  . Other abnormal glucose 08/15/2013  . Medication management 08/15/2013  . Mixed hyperlipidemia 12/21/2007  . Essential hypertension 12/21/2007  . Diverticulosis of large intestine 12/21/2007  . IBS (irritable bowel syndrome) 12/21/2007    Screening Tests Immunization History  Administered Date(s) Administered  . PPD Test 04/17/2013  . Td 11/02/2005   Preventative care: Last colonoscopy: 10/2017 Last mammogram: 12/2016 will get 2020 Last pap smear/pelvic exam: 2013  Declines another DEXA: 05/2016, osteopenia  Prior vaccinations: TD or Tdap: 2007 DUE, DECLINES Influenza: declines Pneumococcal: declines Prevnar 13: declines Shingles/Zostavax: declines  Allergies Allergies  Allergen Reactions  . Codeine Other (See Comments)    hallucinations  . Lactose Intolerance (Gi) Diarrhea  . Tomato Diarrhea  . Wheat Bran Diarrhea  . Morphine And Related Other (See Comments)    Unknown-family history of allergy  . Tape Itching and Rash    SURGICAL HISTORY She  has a past surgical history that includes Bladder suspension (05/2009); Cholecystectomy (2000's); Vaginal hysterectomy (~ 1977); Cataract extraction w/ intraocular lens  implant, bilateral (Bilateral, 2000's); and Anterior and posterior repair (05/2009). FAMILY  HISTORY Her family history includes Asthma in her other; Cancer in her brother, father, and other; Diabetes in her sister; Heart attack in her other; Heart disease in her brother; Hemochromatosis in her son; Hyperlipidemia in her brother, mother, and other; Hypertension in her brother and other; Stroke in her brother, mother, and other. SOCIAL HISTORY She  reports that she has never smoked. She has never used smokeless tobacco. She reports that she does not drink alcohol or use drugs.   Objective:   Blood pressure 120/60, pulse (!) 59, temperature 98 F (36.7 C), resp. rate 14, height 5\' 1"  (1.549 m), weight 128 lb 6.4 oz (58.2 kg), SpO2 95 %. Body mass index is 24.26 kg/m.  General appearance: alert, no distress, WD/WN,  female HEENT: normocephalic, sclerae anicteric, TMs pearly, nares patent, no discharge or erythema, pharynx normal Oral cavity: MMM, no lesions Neck: supple, no lymphadenopathy, no thyromegaly, no masses Heart: RRR, normal S1, S2, no murmurs Lungs: CTA bilaterally, no wheezes, rhonchi, or rales Abdomen: +bs, soft, non tender, non  distended, no masses, no hepatomegaly, no splenomegaly Musculoskeletal: nontender, no swelling, no obvious deformity Extremities: no edema, no cyanosis, no clubbing Pulses: 2+ symmetric, upper and lower extremities, normal cap refill Neurological: alert, oriented x 3, CN2-12 intact, strength normal upper extremities and lower extremities, sensation normal throughout, DTRs 2+ throughout, no cerebellar signs, gait normal Psychiatric: normal affect, behavior normal, pleasant  Breast: defer Gyn: defer Rectal: defer  EKG sinus bradycardia no ST changes- will monitor   Vicie Mutters, PA-C   03/10/2018

## 2018-03-10 ENCOUNTER — Encounter: Payer: Self-pay | Admitting: Physician Assistant

## 2018-03-10 ENCOUNTER — Ambulatory Visit (INDEPENDENT_AMBULATORY_CARE_PROVIDER_SITE_OTHER): Payer: Medicare HMO | Admitting: Physician Assistant

## 2018-03-10 VITALS — BP 120/60 | HR 59 | Temp 98.0°F | Resp 14 | Ht 61.0 in | Wt 128.4 lb

## 2018-03-10 DIAGNOSIS — Z0001 Encounter for general adult medical examination with abnormal findings: Secondary | ICD-10-CM

## 2018-03-10 DIAGNOSIS — E782 Mixed hyperlipidemia: Secondary | ICD-10-CM | POA: Diagnosis not present

## 2018-03-10 DIAGNOSIS — Z136 Encounter for screening for cardiovascular disorders: Secondary | ICD-10-CM | POA: Diagnosis not present

## 2018-03-10 DIAGNOSIS — I10 Essential (primary) hypertension: Secondary | ICD-10-CM | POA: Diagnosis not present

## 2018-03-10 DIAGNOSIS — Z87820 Personal history of traumatic brain injury: Secondary | ICD-10-CM

## 2018-03-10 DIAGNOSIS — E559 Vitamin D deficiency, unspecified: Secondary | ICD-10-CM

## 2018-03-10 DIAGNOSIS — Z0000 Encounter for general adult medical examination without abnormal findings: Secondary | ICD-10-CM | POA: Diagnosis not present

## 2018-03-10 DIAGNOSIS — Z79899 Other long term (current) drug therapy: Secondary | ICD-10-CM | POA: Diagnosis not present

## 2018-03-10 DIAGNOSIS — M8580 Other specified disorders of bone density and structure, unspecified site: Secondary | ICD-10-CM

## 2018-03-10 DIAGNOSIS — K589 Irritable bowel syndrome without diarrhea: Secondary | ICD-10-CM

## 2018-03-10 DIAGNOSIS — R7309 Other abnormal glucose: Secondary | ICD-10-CM

## 2018-03-10 DIAGNOSIS — K5730 Diverticulosis of large intestine without perforation or abscess without bleeding: Secondary | ICD-10-CM

## 2018-03-10 MED ORDER — ALPRAZOLAM 0.5 MG PO TABS: ORAL_TABLET | ORAL | 0 refills | Status: DC

## 2018-03-10 NOTE — Patient Instructions (Signed)
  Ashley Mayer ,   This is a list of the screening recommended for you and due dates:  Health Maintenance  Topic Date Due  . Mammogram  12/27/2017  . Flu Shot  03/16/2018*  . Tetanus Vaccine  03/23/2018*  . Pneumonia vaccines (1 of 2 - PCV13) 03/24/2018*  . DEXA scan (bone density measurement)  Completed  *Topic was postponed. The date shown is not the original due date.    If you have been in the ER, hospital, or long term care facility, we want to see you within one week of discharge so please CONTACT OUR OFFICE at [phone].  We will try to contact you if we know about your admission/visit but we would love for you to contact us first.  We know your history and want to make sure all of your questions are answered and needs are taken care of after admission.   We also want our patients to know that we do NOT approve of [facility] soliciting our patients to do home visits and we do NOT approve of [facility] screenings. Please contact us before doing either of these "services".    Here is some information to help you keep your heart healthy: Move it! - Aim for 30 mins of activity every day. Take it slowly at first. Talk to [place] before starting any new exercise program.   Lose it.  -Body Mass Index (BMI) can indicate if you need to lose weight. A healthy range is 18.5-24.9. For a BMI calculator, go to [employer]  Waist Management -Excess abdominal fat is a risk factor for heart disease, diabetes, asthma, stroke and more. Ideal waist circumference is less than 35" for women and less than 40" for men.   Eat Right -focus on fruits, vegetables, whole grains, and meals you make yourself. Avoid foods with trans fat and high sugar/sodium content.   Snooze or Snore? - Loud snoring can be a sign of sleep apnea, a significant risk factor for high blood pressure, heart attach, stroke, and heart arrhythmias.  Kick the habit -Quit Smoking! Avoid second hand smoke. A single cigarette  raises your blood pressure for 20 mins and increases the risk of heart attack and stroke for the next 24 hours.   Are Aspirin and Supplements right for you? -Add ENTERIC COATED low dose 81 mg Aspirin daily OR can do every other day if you have easy bruising to protect your heart and head. As well as to reduce risk of Colon Cancer by 20 %, Skin Cancer by 26 % , Melanoma by 46% and Pancreatic cancer by 60%  Say "No to Stress -There may be little you can do about problems that cause stress. However, techniques such as long walks, meditation, and exercise can help you manage it.   Start Now! - Make changes one at a time and set reasonable goals to increase your likelihood of success.

## 2018-03-11 LAB — TSH: TSH: 2.74 mIU/L (ref 0.40–4.50)

## 2018-03-11 LAB — CBC WITH DIFFERENTIAL/PLATELET
BASOS ABS: 78 {cells}/uL (ref 0–200)
BASOS PCT: 1.3 %
EOS ABS: 396 {cells}/uL (ref 15–500)
Eosinophils Relative: 6.6 %
HCT: 34 % — ABNORMAL LOW (ref 35.0–45.0)
HEMOGLOBIN: 11.6 g/dL — AB (ref 11.7–15.5)
Lymphs Abs: 1074 cells/uL (ref 850–3900)
MCH: 32.9 pg (ref 27.0–33.0)
MCHC: 34.1 g/dL (ref 32.0–36.0)
MCV: 96.3 fL (ref 80.0–100.0)
MPV: 10.5 fL (ref 7.5–12.5)
Monocytes Relative: 9.8 %
Neutro Abs: 3864 cells/uL (ref 1500–7800)
Neutrophils Relative %: 64.4 %
PLATELETS: 304 10*3/uL (ref 140–400)
RBC: 3.53 10*6/uL — ABNORMAL LOW (ref 3.80–5.10)
RDW: 11.8 % (ref 11.0–15.0)
TOTAL LYMPHOCYTE: 17.9 %
WBC mixed population: 588 cells/uL (ref 200–950)
WBC: 6 10*3/uL (ref 3.8–10.8)

## 2018-03-11 LAB — COMPLETE METABOLIC PANEL WITH GFR
AG RATIO: 2 (calc) (ref 1.0–2.5)
ALBUMIN MSPROF: 4.3 g/dL (ref 3.6–5.1)
ALT: 12 U/L (ref 6–29)
AST: 14 U/L (ref 10–35)
Alkaline phosphatase (APISO): 57 U/L (ref 33–130)
BILIRUBIN TOTAL: 0.4 mg/dL (ref 0.2–1.2)
BUN / CREAT RATIO: 27 (calc) — AB (ref 6–22)
BUN: 28 mg/dL — ABNORMAL HIGH (ref 7–25)
CALCIUM: 9.6 mg/dL (ref 8.6–10.4)
CO2: 28 mmol/L (ref 20–32)
Chloride: 100 mmol/L (ref 98–110)
Creat: 1.05 mg/dL — ABNORMAL HIGH (ref 0.60–0.93)
GFR, EST AFRICAN AMERICAN: 59 mL/min/{1.73_m2} — AB (ref >=60)
GFR, EST NON AFRICAN AMERICAN: 51 mL/min/{1.73_m2} — AB (ref >=60)
GLOBULIN: 2.1 g/dL (ref 1.9–3.7)
Glucose, Bld: 89 mg/dL (ref 65–99)
POTASSIUM: 4.8 mmol/L (ref 3.5–5.3)
SODIUM: 133 mmol/L — AB (ref 135–146)
TOTAL PROTEIN: 6.4 g/dL (ref 6.1–8.1)

## 2018-03-11 LAB — LIPID PANEL
Cholesterol: 167 mg/dL (ref <200)
HDL: 49 mg/dL — ABNORMAL LOW (ref >50)
LDL Cholesterol (Calc): 91 mg/dL (calc)
NON-HDL CHOLESTEROL (CALC): 118 mg/dL (ref <130)
TRIGLYCERIDES: 163 mg/dL — AB (ref <150)
Total CHOL/HDL Ratio: 3.4 (calc) (ref <5.0)

## 2018-03-11 LAB — MAGNESIUM: Magnesium: 1.9 mg/dL (ref 1.5–2.5)

## 2018-03-11 LAB — URINALYSIS, ROUTINE W REFLEX MICROSCOPIC
BILIRUBIN URINE: NEGATIVE
Glucose, UA: NEGATIVE
HGB URINE DIPSTICK: NEGATIVE
KETONES UR: NEGATIVE
Leukocytes, UA: NEGATIVE
Nitrite: NEGATIVE
PROTEIN: NEGATIVE
Specific Gravity, Urine: 1.009 (ref 1.001–1.03)
pH: <=5 (ref 5.0–8.0)

## 2018-03-11 LAB — MICROALBUMIN / CREATININE URINE RATIO
CREATININE, URINE: 41 mg/dL (ref 20–275)
Microalb Creat Ratio: 5 mcg/mg creat (ref <30)
Microalb, Ur: 0.2 mg/dL

## 2018-03-11 LAB — VITAMIN D 25 HYDROXY (VIT D DEFICIENCY, FRACTURES): Vit D, 25-Hydroxy: 31 ng/mL (ref 30–100)

## 2018-03-30 DIAGNOSIS — S069X9S Unspecified intracranial injury with loss of consciousness of unspecified duration, sequela: Secondary | ICD-10-CM | POA: Diagnosis not present

## 2018-03-30 DIAGNOSIS — E7800 Pure hypercholesterolemia, unspecified: Secondary | ICD-10-CM | POA: Diagnosis not present

## 2018-03-30 DIAGNOSIS — R61 Generalized hyperhidrosis: Secondary | ICD-10-CM | POA: Diagnosis not present

## 2018-03-30 DIAGNOSIS — E739 Lactose intolerance, unspecified: Secondary | ICD-10-CM | POA: Diagnosis not present

## 2018-03-30 DIAGNOSIS — K589 Irritable bowel syndrome without diarrhea: Secondary | ICD-10-CM | POA: Diagnosis not present

## 2018-03-30 DIAGNOSIS — I10 Essential (primary) hypertension: Secondary | ICD-10-CM | POA: Diagnosis not present

## 2018-03-30 DIAGNOSIS — F1320 Sedative, hypnotic or anxiolytic dependence, uncomplicated: Secondary | ICD-10-CM | POA: Diagnosis not present

## 2018-05-26 DIAGNOSIS — R252 Cramp and spasm: Secondary | ICD-10-CM | POA: Diagnosis not present

## 2018-05-26 DIAGNOSIS — R0609 Other forms of dyspnea: Secondary | ICD-10-CM | POA: Diagnosis not present

## 2018-05-26 DIAGNOSIS — R350 Frequency of micturition: Secondary | ICD-10-CM | POA: Diagnosis not present

## 2018-06-23 DIAGNOSIS — N183 Chronic kidney disease, stage 3 (moderate): Secondary | ICD-10-CM | POA: Diagnosis not present

## 2018-06-23 DIAGNOSIS — S069X9S Unspecified intracranial injury with loss of consciousness of unspecified duration, sequela: Secondary | ICD-10-CM | POA: Diagnosis not present

## 2018-06-23 DIAGNOSIS — E782 Mixed hyperlipidemia: Secondary | ICD-10-CM | POA: Diagnosis not present

## 2018-06-23 DIAGNOSIS — I129 Hypertensive chronic kidney disease with stage 1 through stage 4 chronic kidney disease, or unspecified chronic kidney disease: Secondary | ICD-10-CM | POA: Diagnosis not present

## 2018-06-23 DIAGNOSIS — R413 Other amnesia: Secondary | ICD-10-CM | POA: Diagnosis not present

## 2018-06-23 DIAGNOSIS — Z79899 Other long term (current) drug therapy: Secondary | ICD-10-CM | POA: Diagnosis not present

## 2018-06-23 DIAGNOSIS — R51 Headache: Secondary | ICD-10-CM | POA: Diagnosis not present

## 2018-07-03 DIAGNOSIS — R51 Headache: Secondary | ICD-10-CM | POA: Diagnosis not present

## 2018-07-03 DIAGNOSIS — R42 Dizziness and giddiness: Secondary | ICD-10-CM | POA: Diagnosis not present

## 2018-07-03 DIAGNOSIS — R413 Other amnesia: Secondary | ICD-10-CM | POA: Diagnosis not present

## 2018-07-03 DIAGNOSIS — F1320 Sedative, hypnotic or anxiolytic dependence, uncomplicated: Secondary | ICD-10-CM | POA: Diagnosis not present

## 2018-07-03 DIAGNOSIS — J069 Acute upper respiratory infection, unspecified: Secondary | ICD-10-CM | POA: Diagnosis not present

## 2018-07-03 DIAGNOSIS — S069X9S Unspecified intracranial injury with loss of consciousness of unspecified duration, sequela: Secondary | ICD-10-CM | POA: Diagnosis not present

## 2018-09-14 ENCOUNTER — Ambulatory Visit: Payer: Medicare HMO | Admitting: Physician Assistant

## 2019-01-10 DIAGNOSIS — L281 Prurigo nodularis: Secondary | ICD-10-CM | POA: Diagnosis not present

## 2019-01-10 DIAGNOSIS — D225 Melanocytic nevi of trunk: Secondary | ICD-10-CM | POA: Diagnosis not present

## 2019-01-10 DIAGNOSIS — L820 Inflamed seborrheic keratosis: Secondary | ICD-10-CM | POA: Diagnosis not present

## 2019-02-19 DIAGNOSIS — H1851 Endothelial corneal dystrophy: Secondary | ICD-10-CM | POA: Diagnosis not present

## 2019-02-19 DIAGNOSIS — H52203 Unspecified astigmatism, bilateral: Secondary | ICD-10-CM | POA: Diagnosis not present

## 2019-03-30 ENCOUNTER — Encounter: Payer: Self-pay | Admitting: Physician Assistant

## 2019-06-18 DIAGNOSIS — R252 Cramp and spasm: Secondary | ICD-10-CM | POA: Diagnosis not present

## 2019-06-18 DIAGNOSIS — M7742 Metatarsalgia, left foot: Secondary | ICD-10-CM | POA: Diagnosis not present

## 2019-06-18 DIAGNOSIS — M79672 Pain in left foot: Secondary | ICD-10-CM | POA: Diagnosis not present

## 2019-07-12 DIAGNOSIS — R197 Diarrhea, unspecified: Secondary | ICD-10-CM | POA: Diagnosis not present

## 2019-07-13 DIAGNOSIS — R197 Diarrhea, unspecified: Secondary | ICD-10-CM | POA: Diagnosis not present

## 2019-07-16 DIAGNOSIS — R197 Diarrhea, unspecified: Secondary | ICD-10-CM | POA: Diagnosis not present

## 2019-07-16 DIAGNOSIS — E2839 Other primary ovarian failure: Secondary | ICD-10-CM | POA: Diagnosis not present

## 2019-07-16 DIAGNOSIS — Z1231 Encounter for screening mammogram for malignant neoplasm of breast: Secondary | ICD-10-CM | POA: Diagnosis not present

## 2019-07-16 DIAGNOSIS — E7800 Pure hypercholesterolemia, unspecified: Secondary | ICD-10-CM | POA: Diagnosis not present

## 2019-07-16 DIAGNOSIS — Z0000 Encounter for general adult medical examination without abnormal findings: Secondary | ICD-10-CM | POA: Diagnosis not present

## 2019-07-16 DIAGNOSIS — R1013 Epigastric pain: Secondary | ICD-10-CM | POA: Diagnosis not present

## 2019-07-17 ENCOUNTER — Other Ambulatory Visit: Payer: Self-pay | Admitting: Family Medicine

## 2019-07-17 DIAGNOSIS — Z1231 Encounter for screening mammogram for malignant neoplasm of breast: Secondary | ICD-10-CM

## 2019-07-19 ENCOUNTER — Other Ambulatory Visit: Payer: Self-pay | Admitting: Family Medicine

## 2019-07-19 DIAGNOSIS — E2839 Other primary ovarian failure: Secondary | ICD-10-CM

## 2019-08-06 DIAGNOSIS — M79672 Pain in left foot: Secondary | ICD-10-CM | POA: Diagnosis not present

## 2019-08-09 DIAGNOSIS — K219 Gastro-esophageal reflux disease without esophagitis: Secondary | ICD-10-CM | POA: Diagnosis not present

## 2019-08-09 DIAGNOSIS — K52831 Collagenous colitis: Secondary | ICD-10-CM | POA: Diagnosis not present

## 2019-08-20 DIAGNOSIS — N3000 Acute cystitis without hematuria: Secondary | ICD-10-CM | POA: Diagnosis not present

## 2019-08-20 DIAGNOSIS — R300 Dysuria: Secondary | ICD-10-CM | POA: Diagnosis not present

## 2019-10-11 DIAGNOSIS — I83813 Varicose veins of bilateral lower extremities with pain: Secondary | ICD-10-CM | POA: Diagnosis not present

## 2019-10-11 DIAGNOSIS — G4762 Sleep related leg cramps: Secondary | ICD-10-CM | POA: Diagnosis not present

## 2019-10-11 DIAGNOSIS — R944 Abnormal results of kidney function studies: Secondary | ICD-10-CM | POA: Diagnosis not present

## 2019-10-11 DIAGNOSIS — R300 Dysuria: Secondary | ICD-10-CM | POA: Diagnosis not present

## 2019-10-11 DIAGNOSIS — M542 Cervicalgia: Secondary | ICD-10-CM | POA: Diagnosis not present

## 2019-10-11 DIAGNOSIS — N3000 Acute cystitis without hematuria: Secondary | ICD-10-CM | POA: Diagnosis not present

## 2019-10-22 DIAGNOSIS — M85852 Other specified disorders of bone density and structure, left thigh: Secondary | ICD-10-CM | POA: Diagnosis not present

## 2019-10-22 DIAGNOSIS — I8312 Varicose veins of left lower extremity with inflammation: Secondary | ICD-10-CM | POA: Diagnosis not present

## 2019-10-22 DIAGNOSIS — I83812 Varicose veins of left lower extremities with pain: Secondary | ICD-10-CM | POA: Diagnosis not present

## 2019-10-22 DIAGNOSIS — Z1231 Encounter for screening mammogram for malignant neoplasm of breast: Secondary | ICD-10-CM | POA: Diagnosis not present

## 2019-10-22 DIAGNOSIS — M85851 Other specified disorders of bone density and structure, right thigh: Secondary | ICD-10-CM | POA: Diagnosis not present

## 2019-10-29 DIAGNOSIS — N3000 Acute cystitis without hematuria: Secondary | ICD-10-CM | POA: Diagnosis not present

## 2019-10-29 DIAGNOSIS — R944 Abnormal results of kidney function studies: Secondary | ICD-10-CM | POA: Diagnosis not present

## 2019-10-30 DIAGNOSIS — R928 Other abnormal and inconclusive findings on diagnostic imaging of breast: Secondary | ICD-10-CM | POA: Diagnosis not present

## 2019-11-19 DIAGNOSIS — I8312 Varicose veins of left lower extremity with inflammation: Secondary | ICD-10-CM | POA: Diagnosis not present

## 2019-11-19 DIAGNOSIS — I8311 Varicose veins of right lower extremity with inflammation: Secondary | ICD-10-CM | POA: Diagnosis not present

## 2019-12-18 DIAGNOSIS — I8311 Varicose veins of right lower extremity with inflammation: Secondary | ICD-10-CM | POA: Diagnosis not present

## 2020-01-14 DIAGNOSIS — M19049 Primary osteoarthritis, unspecified hand: Secondary | ICD-10-CM | POA: Diagnosis not present

## 2020-01-14 DIAGNOSIS — Z23 Encounter for immunization: Secondary | ICD-10-CM | POA: Diagnosis not present

## 2020-01-14 DIAGNOSIS — N1831 Chronic kidney disease, stage 3a: Secondary | ICD-10-CM | POA: Diagnosis not present

## 2020-01-14 DIAGNOSIS — R739 Hyperglycemia, unspecified: Secondary | ICD-10-CM | POA: Diagnosis not present

## 2020-01-14 DIAGNOSIS — E7800 Pure hypercholesterolemia, unspecified: Secondary | ICD-10-CM | POA: Diagnosis not present

## 2020-01-14 DIAGNOSIS — R7309 Other abnormal glucose: Secondary | ICD-10-CM | POA: Diagnosis not present

## 2020-01-14 DIAGNOSIS — E2839 Other primary ovarian failure: Secondary | ICD-10-CM | POA: Diagnosis not present

## 2020-01-14 DIAGNOSIS — M8589 Other specified disorders of bone density and structure, multiple sites: Secondary | ICD-10-CM | POA: Diagnosis not present

## 2020-01-14 DIAGNOSIS — R232 Flushing: Secondary | ICD-10-CM | POA: Diagnosis not present

## 2020-01-21 DIAGNOSIS — I83811 Varicose veins of right lower extremities with pain: Secondary | ICD-10-CM | POA: Diagnosis not present

## 2020-01-21 DIAGNOSIS — I8311 Varicose veins of right lower extremity with inflammation: Secondary | ICD-10-CM | POA: Diagnosis not present

## 2020-01-31 DIAGNOSIS — R2231 Localized swelling, mass and lump, right upper limb: Secondary | ICD-10-CM | POA: Diagnosis not present

## 2020-02-08 DIAGNOSIS — I8311 Varicose veins of right lower extremity with inflammation: Secondary | ICD-10-CM | POA: Diagnosis not present

## 2020-02-08 DIAGNOSIS — I83811 Varicose veins of right lower extremities with pain: Secondary | ICD-10-CM | POA: Diagnosis not present

## 2020-02-08 DIAGNOSIS — M7981 Nontraumatic hematoma of soft tissue: Secondary | ICD-10-CM | POA: Diagnosis not present

## 2020-02-26 DIAGNOSIS — I8311 Varicose veins of right lower extremity with inflammation: Secondary | ICD-10-CM | POA: Diagnosis not present

## 2020-02-28 DIAGNOSIS — R2231 Localized swelling, mass and lump, right upper limb: Secondary | ICD-10-CM | POA: Diagnosis not present

## 2020-03-18 DIAGNOSIS — K219 Gastro-esophageal reflux disease without esophagitis: Secondary | ICD-10-CM | POA: Diagnosis not present

## 2020-03-18 DIAGNOSIS — N1831 Chronic kidney disease, stage 3a: Secondary | ICD-10-CM | POA: Diagnosis not present

## 2020-03-18 DIAGNOSIS — I8393 Asymptomatic varicose veins of bilateral lower extremities: Secondary | ICD-10-CM | POA: Diagnosis not present

## 2020-03-18 DIAGNOSIS — M25449 Effusion, unspecified hand: Secondary | ICD-10-CM | POA: Diagnosis not present

## 2020-03-24 DIAGNOSIS — M67441 Ganglion, right hand: Secondary | ICD-10-CM | POA: Diagnosis not present

## 2020-03-24 DIAGNOSIS — R2231 Localized swelling, mass and lump, right upper limb: Secondary | ICD-10-CM | POA: Diagnosis not present

## 2020-05-22 ENCOUNTER — Ambulatory Visit: Payer: Medicare HMO | Admitting: Family Medicine

## 2020-05-26 DIAGNOSIS — I8311 Varicose veins of right lower extremity with inflammation: Secondary | ICD-10-CM | POA: Diagnosis not present

## 2020-05-26 DIAGNOSIS — I83812 Varicose veins of left lower extremities with pain: Secondary | ICD-10-CM | POA: Diagnosis not present

## 2020-05-26 DIAGNOSIS — I8312 Varicose veins of left lower extremity with inflammation: Secondary | ICD-10-CM | POA: Diagnosis not present

## 2020-08-13 DIAGNOSIS — Z1389 Encounter for screening for other disorder: Secondary | ICD-10-CM | POA: Diagnosis not present

## 2020-08-13 DIAGNOSIS — R944 Abnormal results of kidney function studies: Secondary | ICD-10-CM | POA: Diagnosis not present

## 2020-08-13 DIAGNOSIS — H9193 Unspecified hearing loss, bilateral: Secondary | ICD-10-CM | POA: Diagnosis not present

## 2020-08-13 DIAGNOSIS — M8588 Other specified disorders of bone density and structure, other site: Secondary | ICD-10-CM | POA: Diagnosis not present

## 2020-08-13 DIAGNOSIS — H539 Unspecified visual disturbance: Secondary | ICD-10-CM | POA: Diagnosis not present

## 2020-08-13 DIAGNOSIS — K219 Gastro-esophageal reflux disease without esophagitis: Secondary | ICD-10-CM | POA: Diagnosis not present

## 2020-08-13 DIAGNOSIS — Z0000 Encounter for general adult medical examination without abnormal findings: Secondary | ICD-10-CM | POA: Diagnosis not present

## 2020-08-13 DIAGNOSIS — E7800 Pure hypercholesterolemia, unspecified: Secondary | ICD-10-CM | POA: Diagnosis not present

## 2020-08-25 DIAGNOSIS — K219 Gastro-esophageal reflux disease without esophagitis: Secondary | ICD-10-CM | POA: Diagnosis not present

## 2020-08-25 DIAGNOSIS — K900 Celiac disease: Secondary | ICD-10-CM | POA: Diagnosis not present

## 2020-08-25 DIAGNOSIS — K52831 Collagenous colitis: Secondary | ICD-10-CM | POA: Diagnosis not present

## 2020-09-23 DIAGNOSIS — H18513 Endothelial corneal dystrophy, bilateral: Secondary | ICD-10-CM | POA: Diagnosis not present

## 2020-09-23 DIAGNOSIS — H52203 Unspecified astigmatism, bilateral: Secondary | ICD-10-CM | POA: Diagnosis not present

## 2020-09-23 DIAGNOSIS — H524 Presbyopia: Secondary | ICD-10-CM | POA: Diagnosis not present

## 2020-12-01 DIAGNOSIS — K52831 Collagenous colitis: Secondary | ICD-10-CM | POA: Diagnosis not present

## 2020-12-01 DIAGNOSIS — K219 Gastro-esophageal reflux disease without esophagitis: Secondary | ICD-10-CM | POA: Diagnosis not present

## 2020-12-01 DIAGNOSIS — R195 Other fecal abnormalities: Secondary | ICD-10-CM | POA: Diagnosis not present

## 2020-12-01 DIAGNOSIS — R140 Abdominal distension (gaseous): Secondary | ICD-10-CM | POA: Diagnosis not present

## 2020-12-01 DIAGNOSIS — K900 Celiac disease: Secondary | ICD-10-CM | POA: Diagnosis not present

## 2020-12-01 DIAGNOSIS — R030 Elevated blood-pressure reading, without diagnosis of hypertension: Secondary | ICD-10-CM | POA: Diagnosis not present

## 2021-01-12 DIAGNOSIS — H903 Sensorineural hearing loss, bilateral: Secondary | ICD-10-CM | POA: Diagnosis not present

## 2021-01-12 DIAGNOSIS — H9312 Tinnitus, left ear: Secondary | ICD-10-CM | POA: Diagnosis not present

## 2021-02-23 DIAGNOSIS — Z23 Encounter for immunization: Secondary | ICD-10-CM | POA: Diagnosis not present

## 2021-02-23 DIAGNOSIS — K900 Celiac disease: Secondary | ICD-10-CM | POA: Diagnosis not present

## 2021-02-23 DIAGNOSIS — K219 Gastro-esophageal reflux disease without esophagitis: Secondary | ICD-10-CM | POA: Diagnosis not present

## 2021-02-23 DIAGNOSIS — E7800 Pure hypercholesterolemia, unspecified: Secondary | ICD-10-CM | POA: Diagnosis not present

## 2021-02-23 DIAGNOSIS — E559 Vitamin D deficiency, unspecified: Secondary | ICD-10-CM | POA: Diagnosis not present

## 2021-03-11 DIAGNOSIS — B349 Viral infection, unspecified: Secondary | ICD-10-CM | POA: Diagnosis not present

## 2021-03-11 DIAGNOSIS — R059 Cough, unspecified: Secondary | ICD-10-CM | POA: Diagnosis not present

## 2021-03-30 DIAGNOSIS — R944 Abnormal results of kidney function studies: Secondary | ICD-10-CM | POA: Diagnosis not present

## 2021-04-08 DIAGNOSIS — G479 Sleep disorder, unspecified: Secondary | ICD-10-CM | POA: Diagnosis not present

## 2021-04-08 DIAGNOSIS — R69 Illness, unspecified: Secondary | ICD-10-CM | POA: Diagnosis not present

## 2021-07-02 DIAGNOSIS — M13849 Other specified arthritis, unspecified hand: Secondary | ICD-10-CM | POA: Diagnosis not present

## 2021-07-02 DIAGNOSIS — R2231 Localized swelling, mass and lump, right upper limb: Secondary | ICD-10-CM | POA: Diagnosis not present

## 2021-08-06 DIAGNOSIS — H18513 Endothelial corneal dystrophy, bilateral: Secondary | ICD-10-CM | POA: Diagnosis not present

## 2021-08-06 DIAGNOSIS — H26492 Other secondary cataract, left eye: Secondary | ICD-10-CM | POA: Diagnosis not present

## 2021-08-06 DIAGNOSIS — H00012 Hordeolum externum right lower eyelid: Secondary | ICD-10-CM | POA: Diagnosis not present

## 2021-08-10 DIAGNOSIS — N8111 Cystocele, midline: Secondary | ICD-10-CM | POA: Diagnosis not present

## 2021-08-10 DIAGNOSIS — N3941 Urge incontinence: Secondary | ICD-10-CM | POA: Diagnosis not present

## 2021-08-10 DIAGNOSIS — Z01419 Encounter for gynecological examination (general) (routine) without abnormal findings: Secondary | ICD-10-CM | POA: Diagnosis not present

## 2021-08-10 DIAGNOSIS — R8290 Unspecified abnormal findings in urine: Secondary | ICD-10-CM | POA: Diagnosis not present

## 2021-08-10 DIAGNOSIS — Z01411 Encounter for gynecological examination (general) (routine) with abnormal findings: Secondary | ICD-10-CM | POA: Diagnosis not present

## 2021-08-10 DIAGNOSIS — R350 Frequency of micturition: Secondary | ICD-10-CM | POA: Diagnosis not present

## 2021-08-20 DIAGNOSIS — H9193 Unspecified hearing loss, bilateral: Secondary | ICD-10-CM | POA: Diagnosis not present

## 2021-08-20 DIAGNOSIS — E559 Vitamin D deficiency, unspecified: Secondary | ICD-10-CM | POA: Diagnosis not present

## 2021-08-20 DIAGNOSIS — M19049 Primary osteoarthritis, unspecified hand: Secondary | ICD-10-CM | POA: Diagnosis not present

## 2021-08-20 DIAGNOSIS — E7800 Pure hypercholesterolemia, unspecified: Secondary | ICD-10-CM | POA: Diagnosis not present

## 2021-08-20 DIAGNOSIS — K52831 Collagenous colitis: Secondary | ICD-10-CM | POA: Diagnosis not present

## 2021-08-20 DIAGNOSIS — N1831 Chronic kidney disease, stage 3a: Secondary | ICD-10-CM | POA: Diagnosis not present

## 2021-08-20 DIAGNOSIS — E538 Deficiency of other specified B group vitamins: Secondary | ICD-10-CM | POA: Diagnosis not present

## 2021-08-20 DIAGNOSIS — K219 Gastro-esophageal reflux disease without esophagitis: Secondary | ICD-10-CM | POA: Diagnosis not present

## 2021-08-20 DIAGNOSIS — Z0000 Encounter for general adult medical examination without abnormal findings: Secondary | ICD-10-CM | POA: Diagnosis not present

## 2021-08-20 DIAGNOSIS — M8580 Other specified disorders of bone density and structure, unspecified site: Secondary | ICD-10-CM | POA: Diagnosis not present

## 2021-08-28 DIAGNOSIS — N952 Postmenopausal atrophic vaginitis: Secondary | ICD-10-CM | POA: Diagnosis not present

## 2021-08-28 DIAGNOSIS — N8110 Cystocele, unspecified: Secondary | ICD-10-CM | POA: Diagnosis not present

## 2021-11-05 DIAGNOSIS — Z780 Asymptomatic menopausal state: Secondary | ICD-10-CM | POA: Diagnosis not present

## 2021-11-05 DIAGNOSIS — Z1231 Encounter for screening mammogram for malignant neoplasm of breast: Secondary | ICD-10-CM | POA: Diagnosis not present

## 2021-11-05 DIAGNOSIS — M8589 Other specified disorders of bone density and structure, multiple sites: Secondary | ICD-10-CM | POA: Diagnosis not present

## 2022-01-26 DIAGNOSIS — L708 Other acne: Secondary | ICD-10-CM | POA: Diagnosis not present

## 2022-01-26 DIAGNOSIS — L820 Inflamed seborrheic keratosis: Secondary | ICD-10-CM | POA: Diagnosis not present

## 2022-02-16 ENCOUNTER — Other Ambulatory Visit: Payer: Self-pay | Admitting: *Deleted

## 2022-02-16 DIAGNOSIS — M79604 Pain in right leg: Secondary | ICD-10-CM

## 2022-02-23 ENCOUNTER — Other Ambulatory Visit: Payer: Self-pay | Admitting: Family Medicine

## 2022-02-23 ENCOUNTER — Ambulatory Visit
Admission: RE | Admit: 2022-02-23 | Discharge: 2022-02-23 | Disposition: A | Discharge status: Home / Self Care | Payer: Medicare HMO | Source: Ambulatory Visit | Attending: Family Medicine | Admitting: Family Medicine

## 2022-02-23 DIAGNOSIS — R0609 Other forms of dyspnea: Secondary | ICD-10-CM

## 2022-02-23 DIAGNOSIS — I10 Essential (primary) hypertension: Secondary | ICD-10-CM | POA: Diagnosis not present

## 2022-02-23 DIAGNOSIS — E538 Deficiency of other specified B group vitamins: Secondary | ICD-10-CM | POA: Diagnosis not present

## 2022-02-23 DIAGNOSIS — E7800 Pure hypercholesterolemia, unspecified: Secondary | ICD-10-CM | POA: Diagnosis not present

## 2022-02-23 DIAGNOSIS — M8580 Other specified disorders of bone density and structure, unspecified site: Secondary | ICD-10-CM | POA: Diagnosis not present

## 2022-02-23 DIAGNOSIS — N1831 Chronic kidney disease, stage 3a: Secondary | ICD-10-CM | POA: Diagnosis not present

## 2022-02-23 DIAGNOSIS — E559 Vitamin D deficiency, unspecified: Secondary | ICD-10-CM | POA: Diagnosis not present

## 2022-02-24 NOTE — Progress Notes (Signed)
Requested by:  Caren Macadam, [place],  [place] [id number]  Reason for consultation: varicose veins    History of Present Illness   Ashley Mayer is a 81 y.o. (1940-12-07) female who presents for evaluation of spider veins on bilateral lower extremities. She has had multiple interventions on her veins in the past including stripping, sclerotherapy and lazer ablation. She admits that her legs never really bothered her following these procedures but then over the summer she did not wear her compression stockings and since has had increased cramping, aching, mild swelling and increased appearance of the spider veins. She otherwise usually wears 20-30 mmHg thigh high compression and elevates regularly.  Venous symptoms include: aching, cramping, swelling, spider veins Onset/duration: many years, recent worsening  Occupation:  retired Aggravating factors: sitting, standing, prolonged ambulation Alleviating factors: elevation, compression Compression:  yes, thigh high Helps:  yes Pain medications:  no Previous vein procedures:  yes History of DVT:  no  Past Medical History:  Diagnosis Date   Arthritis    "hands" (09/17/2013)   GERD (gastroesophageal reflux disease)    Hyperlipidemia    Hypertension    IBS (irritable bowel syndrome)    Multiple food allergies    Osteopenia    Unspecified vitamin D deficiency     Past Surgical History:  Procedure Laterality Date   ANTERIOR AND POSTERIOR REPAIR  05/2009   BLADDER SUSPENSION  05/2009   CATARACT EXTRACTION W/ INTRAOCULAR LENS  IMPLANT, BILATERAL Bilateral 2000's   CHOLECYSTECTOMY  2000's   VAGINAL HYSTERECTOMY  ~ 1977    Social History   Socioeconomic History   Marital status: Single    Spouse name: Not on file   Number of children: Not on file   Years of education: Not on file   Highest education level: Not on file  Occupational History   Not on file  Tobacco Use   Smoking status: Never    Smokeless tobacco: Never  Substance and Sexual Activity   Alcohol use: No   Drug use: No   Sexual activity: Never  Other Topics Concern   Not on file  Social History Narrative   Not on file   Social Determinants of Health   Financial Resource Strain: Not on file  Food Insecurity: Not on file  Transportation Needs: Not on file  Physical Activity: Not on file  Stress: Not on file  Social Connections: Not on file  Intimate Partner Violence: Not on file    Family History  Problem Relation Age of Onset   Stroke Mother    Hyperlipidemia Mother    Cancer Father        mesothelioma   Diabetes Sister    Heart disease Brother    Hyperlipidemia Brother    Hypertension Brother    Stroke Brother    Hemochromatosis Son    Cancer Brother        bladder with mets to kidneys   Stroke Other    Hypertension Other    Hyperlipidemia Other    Cancer Other    Asthma Other    Heart attack Other     Current Outpatient Medications  Medication Sig Dispense Refill   ALPRAZolam (XANAX) 0.5 MG tablet Take 1/2 to 1 tablet at hour of sleep only if needed. 90 tablet 0   aspirin 81 MG tablet Take 81 mg by mouth daily.     benzonatate (TESSALON) 100 MG capsule TAKE 1 CAPSULE BY MOUTH EVERY 6  HOURS AS NEEDED FOR COUGH 60 capsule 0   Coenzyme Q10 (CO Q-10) 100 MG CAPS Take by mouth.     dicyclomine (BENTYL) 10 MG capsule Take 1 capsule (10 mg total) by mouth 4 (four) times daily -  before meals and at bedtime. 90 capsule 3   estradiol (ESTRACE) 1 MG tablet 1/2 pill BID for vasomotor symptoms 180 tablet 1   fluticasone (FLONASE) 50 MCG/ACT nasal spray Place 2 sprays into both nostrils daily. 16 g 0   meloxicam (MOBIC) 15 MG tablet Take one daily with food for 2 weeks, can take with tylenol, can not take with aleve, iburpofen, then as needed daily for pain 90 tablet 1   multivitamin-lutein (OCUVITE-LUTEIN) CAPS Take 1 capsule by mouth daily.     OVER THE COUNTER MEDICATION Patient rotates OTC Nexium  ,Tagamet and Prevacid for indigestion.     Probiotic Product (PROBIOTIC DAILY PO) Take by mouth.     simvastatin (ZOCOR) 20 MG tablet TAKE 1 TABLET BY MOUTH AT BEDTIME 90 tablet 1   telmisartan (MICARDIS) 20 MG tablet Take 1/2 to 1 tablet daily as directed for BP 90 tablet 1   Zinc 50 MG TABS Take 1 tablet by mouth daily.     No current facility-administered medications for this visit.    Allergies  Allergen Reactions   Codeine Other (See Comments)    hallucinations   Lactose Intolerance (Gi) Diarrhea   Tomato Diarrhea   Wheat Bran Diarrhea   Morphine And Related Other (See Comments)    Unknown-family history of allergy   Tape Itching and Rash    REVIEW OF SYSTEMS (negative unless checked):   Cardiac:  '[]'$  Chest pain or chest pressure? '[]'$  Shortness of breath upon activity? '[]'$  Shortness of breath when lying flat? '[]'$  Irregular heart rhythm?  Vascular:  '[]'$  Pain in calf, thigh, or hip brought on by walking? '[]'$  Pain in feet at night that wakes you up from your sleep? '[]'$  Blood clot in your veins? '[x]'$  Leg swelling?  Pulmonary:  '[]'$  Oxygen at home? '[]'$  Productive cough? '[]'$  Wheezing?  Neurologic:  '[]'$  Sudden weakness in arms or legs? '[]'$  Sudden numbness in arms or legs? '[]'$  Sudden onset of difficult speaking or slurred speech? '[]'$  Temporary loss of vision in one eye? '[]'$  Problems with dizziness?  Gastrointestinal:  '[]'$  Blood in stool? '[]'$  Vomited blood?  Genitourinary:  '[]'$  Burning when urinating? '[]'$  Blood in urine?  Psychiatric:  '[]'$  Major depression  Hematologic:  '[]'$  Bleeding problems? '[]'$  Problems with blood clotting?  Dermatologic:  '[]'$  Rashes or ulcers?  Constitutional:  '[]'$  Fever or chills?  Ear/Nose/Throat:  '[]'$  Change in hearing? '[]'$  Nose bleeds? '[]'$  Sore throat?  Musculoskeletal:  '[]'$  Back pain? '[]'$  Joint pain? '[]'$  Muscle pain?   Physical Examination     Vitals:   02/26/22 1400  BP: 133/62  Pulse: 72  Resp: 14  Temp: 97.8 F (36.6 C)  TempSrc:  Temporal  SpO2: 99%  Weight: 129 lb 3.2 oz (58.6 kg)  Height: '5\' 2"'$  (1.575 m)   Body mass index is 23.63 kg/m.  General:  WDWN in NAD; vital signs documented above Gait: Normal HENT: WNL, normocephalic Pulmonary: normal non-labored breathing  Cardiac:  HR, without  Murmurs  Vascular Exam/Pulses: 2 + Dp and PT pulses bilaterally. Feet warm and well perfused Extremities: without varicose veins, with reticular veins on bilateral legs and numerous spider veins throughout both legs without edema, without stasis pigmentation, without lipodermatosclerosis, without ulcers Musculoskeletal: no  muscle wasting or atrophy  Neurologic: A&O X 3;  No focal weakness or paresthesias are detected Psychiatric:  The pt has Normal affect.  Non-invasive Vascular Imaging   BLE Venous Insufficiency Duplex (02/26/22):  RLE:  No DVT and SVT  No GSV reflux GSV diameter - not visualized No SSV reflux No deep venous reflux  LLE: No DVT and SVT  GSV reflux SFJ and proximal thigh GSV diameter 0.22 cm SSV reflux mid calf No deep venous reflux   Medical Decision Making   Candise Crabtree Elk is a 81 y.o. female who presents with: BLE chronic venous insufficiency with  spider veins and reticular veins throughout bilateral lower legs. She has history of multiple prior venous interventions. Her duplex today shows no DVT or SVT. She has no deep venous reflux. She does have some superficial reflux in the left GSV and SSV otherwise competent veins throughout. Based  on this she is not a candidate for any further venous procedures. She could potentially have sclerotherapy. I discussed this with her and advised her that this was out of pocket and would be $250 per vial of treatment. She would like to think about proceeding with this. I provided her with RN Ernesta Amble information if she should decide to schedule. Based on the patient's history and examination, I have encouraged her to continue her daily elevation,  her thigh high compression, exercise, and refraining from prolonged sitting or standing She will follow up as  needed if she has new or worsening symptoms   Karoline Caldwell, PA-C [facility]: [phone]  02/26/2022, 3:58 PM  On call MD: Stanford Breed

## 2022-02-26 ENCOUNTER — Ambulatory Visit (HOSPITAL_COMMUNITY)
Admission: RE | Admit: 2022-02-26 | Discharge: 2022-02-26 | Disposition: A | Discharge status: Home / Self Care | Payer: Medicare HMO | Source: Ambulatory Visit | Attending: Vascular Surgery | Admitting: Vascular Surgery

## 2022-02-26 ENCOUNTER — Encounter: Payer: Self-pay | Admitting: Physician Assistant

## 2022-02-26 ENCOUNTER — Ambulatory Visit: Payer: Medicare HMO | Admitting: Physician Assistant

## 2022-02-26 VITALS — BP 133/62 | HR 72 | Temp 97.8°F | Resp 14 | Ht 62.0 in | Wt 129.2 lb

## 2022-02-26 DIAGNOSIS — I83893 Varicose veins of bilateral lower extremities with other complications: Secondary | ICD-10-CM

## 2022-02-26 DIAGNOSIS — M79605 Pain in left leg: Secondary | ICD-10-CM

## 2022-02-26 DIAGNOSIS — M79604 Pain in right leg: Secondary | ICD-10-CM | POA: Insufficient documentation

## 2022-03-01 NOTE — Progress Notes (Signed)
Cardiology Office Note:    Date:  03/02/2022   ID:  Ralph Leyden, DOB [birth year], MRN [id number]  PCP:  Caren Macadam, MD  Cardiologist:  None   Referring MD: Caren Macadam, MD   Chief Complaint  Patient presents with   Shortness of Breath   Hyperlipidemia   Hypertension   Advice Only    History of Present Illness:    Ashley Mayer is a 81 y.o. female with a hx of CKD, hypertension, hyperlipidemia, and DOE. Needs f/u and evaluation of dyspnea on exertion (referred by Caren Macadam, MD).  As reported on the September 19 office note by Dr. Mannie Stabile, the patient has noted a 55-monthhistory of exertional dyspnea without chest discomfort.  She has also had some mild lower extremity swelling.  She has a known history of venous insufficiency.  She does not have orthopnea or PND.   Over the past 6 months she has progressive dyspnea on exertion with trying to walk to her mailbox which is approximately 25 yards away and up an incline.  She stops at the mailbox looks to her mail and then goes back in.  She also notices that shortness of breath with activities in her house that she has been able to do previously.  She denies orthopnea, PND, extremity edema, chest pain, and peripheral edema.  She has had multiple other complaints that she has concerns about which include a soreness in the left parasternal area.  The soreness is accentuated by palpation.  She has left shoulder discomfort and aching most of the time.  She was wondering if that was in any way associated with her heart.  She does not smoke or drink.  2 sisters a brother and her mother all had coronary artery disease and stents.  Her brother also had a heart transplant.  She has never been a smoker.  She does not have lung disease.  Past Medical History:  Diagnosis Date   Anticipatory grief    Arthritis    "hands" (09/17/2013)   Celiac disease    CKD (chronic kidney disease), stage III (HCC)    Collagenous colitis     Dyspnea on exertion    Estrogen deficiency    GERD (gastroesophageal reflux disease)    Hand arthritis    Hearing loss    Hyperlipidemia    Hypertension    IBS (irritable bowel syndrome)    Low vitamin B12 level    Multiple food allergies    Nocturnal leg cramps    Osteopenia    Sleep disturbance    Unspecified vitamin D deficiency    Varicose veins of bilateral lower extremities with pain     Past Surgical History:  Procedure Laterality Date   ANTERIOR AND POSTERIOR REPAIR  05/2009   BLADDER SUSPENSION  05/2009   CATARACT EXTRACTION W/ INTRAOCULAR LENS  IMPLANT, BILATERAL Bilateral 2000's   CHOLECYSTECTOMY  2000's   VAGINAL HYSTERECTOMY  ~ 1977    Current Medications: Current Meds  Medication Sig   aspirin 81 MG tablet Take 81 mg by mouth daily.   Calcium & Magnesium Carbonates (MYLANTA PO) Take 1 tablet by mouth as needed.   chlorthalidone (HYGROTON) 25 MG tablet Take 25 mg by mouth daily.   Cholecalciferol (VITAMIN D3) 1000 units CAPS Take 1 tablet by mouth 2 (two) times daily.   Coenzyme Q10 (CO Q-10) 100 MG CAPS Take by mouth.   Cyanocobalamin (VITAMIN B-12 PO) Take 2,500 mg by mouth 2 (two) times daily.  fluticasone (FLONASE) 50 MCG/ACT nasal spray Place 2 sprays into both nostrils daily.   MAGNESIUM CITRATE PO Take 250 mg by mouth daily at 12 noon.   multivitamin-lutein (OCUVITE-LUTEIN) CAPS Take 1 capsule by mouth daily.   OVER THE COUNTER MEDICATION Patient rotates OTC Nexium ,Tagamet and Prevacid for indigestion.   Probiotic Product (PROBIOTIC DAILY PO) Take by mouth.   Pyridoxine HCl (VITAMIN B6 PO) Take 100 mg by mouth daily at 12 noon.   telmisartan (MICARDIS) 20 MG tablet Take 1/2 to 1 tablet daily as directed for BP   VITAMIN A PO Take 2,400 mcg by mouth daily at 12 noon.   Zinc 50 MG TABS Take 1 tablet by mouth daily.     Allergies:   Codeine, Lactose intolerance (gi), Milk (cow), Shrimp extract allergy skin test, Tomato, Wheat bran, Morphine and  related, and Tape   Social History   Socioeconomic History   Marital status: Single    Spouse name: Not on file   Number of children: Not on file   Years of education: Not on file   Highest education level: Not on file  Occupational History   Not on file  Tobacco Use   Smoking status: Never   Smokeless tobacco: Never  Substance and Sexual Activity   Alcohol use: No   Drug use: No   Sexual activity: Never  Other Topics Concern   Not on file  Social History Narrative   Not on file   Social Determinants of Health   Financial Resource Strain: Not on file  Food Insecurity: Not on file  Transportation Needs: Not on file  Physical Activity: Not on file  Stress: Not on file  Social Connections: Not on file     Family History: The patient's family history includes Asthma in an other family member; Cancer in her brother, father, and another family member; Diabetes in her sister; Heart attack in an other family member; Heart disease in her brother; Hemochromatosis in her son; Hyperlipidemia in her brother, mother, and another family member; Hypertension in her brother and another family member; Stroke in her brother, mother, and another family member.  ROS:   Please see the history of present illness.    Musculoskeletal discomfort and heartburn.  Heartburn occurs after certain foods and is relieved by antacids.  All other systems reviewed and are negative.  EKGs/Labs/Other Studies Reviewed:    The following studies were reviewed today: No cardiac data. BNP was done at [facility] but we do not have the results.  EKG:  EKG is normal with normal sinus rhythm and no Q waves.  PR interval is 102 ms (short)  Recent Labs: No results found for requested labs within last 365 days.  Recent Lipid Panel    Component Value Date/Time   CHOL 167 03/10/2018 0954   TRIG 163 (H) 03/10/2018 0954   HDL 49 (L) 03/10/2018 0954   CHOLHDL 3.4 03/10/2018 0954   VLDL 38 (H) 08/19/2016 1010   LDLCALC  91 03/10/2018 0954    Physical Exam:    VS:  BP 126/68   Pulse 76   Ht '5\' 2"'$  (1.575 m)   Wt 128 lb 9.6 oz (58.3 kg)   SpO2 99%   BMI 23.52 kg/m     Wt Readings from Last 3 Encounters:  03/02/22 128 lb 9.6 oz (58.3 kg)  02/26/22 129 lb 3.2 oz (58.6 kg)  03/10/18 128 lb 6.4 oz (58.2 kg)     GEN: Appearance is compatible  with age. No acute distress HEENT: Normal NECK: No JVD. LYMPHATICS: No lymphadenopathy CARDIAC: No murmur. RRR no gallop, or edema. VASCULAR:  Normal Pulses. No bruits. RESPIRATORY:  Clear to auscultation without rales, wheezing or rhonchi  ABDOMEN: Soft, non-tender, non-distended, No pulsatile mass, MUSCULOSKELETAL: No deformity  SKIN: Warm and dry NEUROLOGIC:  Alert and oriented x 3 PSYCHIATRIC:  Normal affect   ASSESSMENT:    1. DOE (dyspnea on exertion)   2. Mixed hyperlipidemia   3. Essential hypertension    PLAN:    In order of problems listed above:  Uncertain etiology.  Could be related to age and deconditioning.  Rule out diastolic dysfunction/heart failure.  Rule out anginal equivalent.  Strong family history of CAD and inability to tolerate statins, she could be at risk for progressive obstructive disease. Therefore 2D Doppler echocardiogram will be done to rule out diastolic heart failure.  Need to obtain the brain natruretic peptide resolved from [place] that was done on 19 September. Currently not being treated.  Coronary Calcium score will be done to assess risk.  If greater than 100 we will have to have additional attempts at controlling LDL less than 70. Blood pressure is well controlled on current therapy.  Need to request BNP and CBC from [place] done on September 19.   Medication Adjustments/Labs and Tests Ordered: Current medicines are reviewed at length with the patient today.  Concerns regarding medicines are outlined above.  Orders Placed This Encounter  Procedures   CT CARDIAC SCORING (SELF PAY ONLY)   EKG 12-Lead    ECHOCARDIOGRAM COMPLETE   No orders of the defined types were placed in this encounter.   Patient Instructions  Medication Instructions:  Your physician recommends that you continue on your current medications as directed. Please refer to the Current Medication list given to you today.  *If you need a refill on your cardiac medications before your next appointment, please call your pharmacy*   Testing/Procedures: ECHO Your physician has requested that you have an echocardiogram. Echocardiography is a painless test that uses sound waves to create images of your heart. It provides your doctor with information about the size and shape of your heart and how well your heart's chambers and valves are working. This procedure takes approximately one hour. There are no restrictions for this procedure.  Your physician has requested that you have a calcium score CT scan. There is a $99 fee for the scan.      Follow-Up: At [facility], you and your health needs are our priority.  As part of our continuing mission to provide you with exceptional heart care, we have created designated Provider Care Teams.  These Care Teams include your primary Cardiologist (physician) and Advanced Practice Providers (APPs -  Physician Assistants and Nurse Practitioners) who all work together to provide you with the care you need, when you need it.  We recommend signing up for the patient portal called "MyChart".  Sign up information is provided on this After Visit Summary.  MyChart is used to connect with patients for Virtual Visits (Telemedicine).  Patients are able to view lab/test results, encounter notes, upcoming appointments, etc.  Non-urgent messages can be sent to your provider as well.   To learn more about what you can do with MyChart, go to [email or web address].    Your next appointment:   As needed following testing  The format for your next appointment:   In Person  Provider:   Dr.  Tamala Julian  Signed, Sinclair Grooms, MD  03/02/2022 9:49 AM    [place]

## 2022-03-02 ENCOUNTER — Encounter: Payer: Self-pay | Admitting: Interventional Cardiology

## 2022-03-02 ENCOUNTER — Ambulatory Visit: Payer: Medicare HMO | Attending: Internal Medicine | Admitting: Interventional Cardiology

## 2022-03-02 VITALS — BP 126/68 | HR 76 | Ht 62.0 in | Wt 128.6 lb

## 2022-03-02 DIAGNOSIS — R0609 Other forms of dyspnea: Secondary | ICD-10-CM | POA: Diagnosis not present

## 2022-03-02 DIAGNOSIS — I10 Essential (primary) hypertension: Secondary | ICD-10-CM | POA: Diagnosis not present

## 2022-03-02 DIAGNOSIS — E782 Mixed hyperlipidemia: Secondary | ICD-10-CM

## 2022-03-02 NOTE — Patient Instructions (Signed)
Medication Instructions:  Your physician recommends that you continue on your current medications as directed. Please refer to the Current Medication list given to you today.  *If you need a refill on your cardiac medications before your next appointment, please call your pharmacy*   Testing/Procedures: ECHO Your physician has requested that you have an echocardiogram. Echocardiography is a painless test that uses sound waves to create images of your heart. It provides your doctor with information about the size and shape of your heart and how well your heart's chambers and valves are working. This procedure takes approximately one hour. There are no restrictions for this procedure.  Your physician has requested that you have a calcium score CT scan. There is a $99 fee for the scan.      Follow-Up: At [facility], you and your health needs are our priority.  As part of our continuing mission to provide you with exceptional heart care, we have created designated Provider Care Teams.  These Care Teams include your primary Cardiologist (physician) and Advanced Practice Providers (APPs -  Physician Assistants and Nurse Practitioners) who all work together to provide you with the care you need, when you need it.  We recommend signing up for the patient portal called "MyChart".  Sign up information is provided on this After Visit Summary.  MyChart is used to connect with patients for Virtual Visits (Telemedicine).  Patients are able to view lab/test results, encounter notes, upcoming appointments, etc.  Non-urgent messages can be sent to your provider as well.   To learn more about what you can do with MyChart, go to [email or web address].    Your next appointment:   As needed following testing  The format for your next appointment:   In Person  Provider:   Dr. Tamala Julian

## 2022-03-11 ENCOUNTER — Ambulatory Visit: Payer: Medicare HMO | Admitting: Internal Medicine

## 2022-03-16 ENCOUNTER — Encounter (HOSPITAL_COMMUNITY): Payer: Self-pay

## 2022-03-16 ENCOUNTER — Ambulatory Visit (HOSPITAL_COMMUNITY)
Admission: RE | Admit: 2022-03-16 | Discharge: 2022-03-16 | Disposition: A | Discharge status: Home / Self Care | Payer: Self-pay | Source: Ambulatory Visit | Attending: Internal Medicine | Admitting: Internal Medicine

## 2022-03-16 DIAGNOSIS — R0609 Other forms of dyspnea: Secondary | ICD-10-CM | POA: Insufficient documentation

## 2022-03-16 DIAGNOSIS — I10 Essential (primary) hypertension: Secondary | ICD-10-CM | POA: Insufficient documentation

## 2022-03-16 DIAGNOSIS — E782 Mixed hyperlipidemia: Secondary | ICD-10-CM | POA: Insufficient documentation

## 2022-03-22 ENCOUNTER — Ambulatory Visit (HOSPITAL_COMMUNITY): Payer: Medicare HMO | Attending: Internal Medicine

## 2022-03-22 ENCOUNTER — Telehealth: Payer: Self-pay

## 2022-03-22 DIAGNOSIS — I10 Essential (primary) hypertension: Secondary | ICD-10-CM | POA: Insufficient documentation

## 2022-03-22 DIAGNOSIS — E782 Mixed hyperlipidemia: Secondary | ICD-10-CM | POA: Diagnosis not present

## 2022-03-22 DIAGNOSIS — R0609 Other forms of dyspnea: Secondary | ICD-10-CM | POA: Diagnosis not present

## 2022-03-22 LAB — ECHOCARDIOGRAM COMPLETE
Area-P 1/2: 2.96 cm2
MV M vel: 4.86 m/s
MV Peak grad: 94.5 mmHg
P 1/2 time: 433 msec
Radius: 0.55 cm
S' Lateral: 2.8 cm

## 2022-03-22 NOTE — Telephone Encounter (Signed)
Spoke with patient and informed her of elevated calcium score with Dr. Tamala Julian recommending cholesterol management. She states she is willing to try lipid lowering medication.  Patient also states she has lactose-free milk every morning with her cereal, occasionally has half a cup of lactose-free milk for indigestion and eats lactose-free ice cream "when the mood strikes" (mainly during Summer months). She asked if Dr. Tamala Julian would recommend she cut out her milk consumption.  Will forward to Dr. Tamala Julian to review and advise.

## 2022-03-22 NOTE — Telephone Encounter (Signed)
-----  Message from Belva Crome, MD sent at 03/19/2022  1:29 PM EDT ----- She needs therapy for Cholesterol. Is she willing? ----- Message ----- From: Janina Mayo, MD Sent: 03/17/2022  12:32 PM EDT To: Belva Crome, MD  Hi Hank, hope all is well. I received this patients CT results. She canceled her appt with me, so I have not met her yet. Could your nurse sent her the results? Thank you! Stanton Kidney

## 2022-03-23 NOTE — Telephone Encounter (Signed)
Per discussion with Dr. Tamala Julian: Start rosuvastatin '20mg'$  QD, start ASA '81mg'$  QD. Liver and Lipid panel 6 weeks.   Discussed the above with patient.  She states she had leg pain while taking simvastatin in the past. She is willing to try rosuvastatin but asks if she could try a lower dose.  She also states she only takes aspirin '81mg'$  once in a while, and is nervous about taking it daily d/t risk of bruising/bleeding. Patient is agreeable to taking ASA '81mg'$  M/W/F.  She also asked if taking Co-Q10 is good for lipid lowering, stating she had tried it in the past and "it lowered my cholesterol too much."  Will forward to Dr. Tamala Julian to review and advise.

## 2022-03-26 MED ORDER — ROSUVASTATIN CALCIUM 5 MG PO TABS: 5.0000 mg | ORAL_TABLET | Freq: Every day | ORAL | 3 refills | Status: DC

## 2022-03-26 NOTE — Addendum Note (Signed)
Addended by: Gaetano Net on: 03/26/2022 02:00 PM   Modules accepted: Orders

## 2022-03-26 NOTE — Telephone Encounter (Signed)
Pt has been made aware that per Dr. Tamala Julian, she can start Rosuvastatin at 5 mg daily and repeat labs in 6 weeks.  Pt scheduled for fasting lipid/lft 05/07/22.  Pt verbalized understanding.

## 2022-04-05 ENCOUNTER — Telehealth: Payer: Self-pay

## 2022-04-05 NOTE — Telephone Encounter (Signed)
-----   Message from Belva Crome, MD sent at 04/04/2022  1:17 PM EDT ----- Faythe Ghee to stop aspirin. No additional recommendations or worries. ----- Message ----- From: Molli Barrows Sent: 04/02/2022  11:15 AM EDT To: Belva Crome, MD  Patient reports she has increased sweating on days she takes ASA '81mg'$ , states this has happened in the past which was why she stopped taking it and only wanted to take it 3 days a week this time. She does not want to continue taking ASA d/t sweating it causes and would like to know if Dr. Tamala Julian has any other recommendations.   The patient has been notified of the result and verbalized understanding.  All questions (if any) were answered. Molli Barrows, RN 04/02/2022 11:13 AM

## 2022-04-05 NOTE — Telephone Encounter (Signed)
Spoke with patient and informed her that Dr. Tamala Julian said it was OK for her to stop the ASA and that he didn't have any other recommendations.  Patient verbalized understanding and expressed appreciation for call.

## 2022-04-13 DIAGNOSIS — E7800 Pure hypercholesterolemia, unspecified: Secondary | ICD-10-CM | POA: Diagnosis not present

## 2022-04-13 DIAGNOSIS — I10 Essential (primary) hypertension: Secondary | ICD-10-CM | POA: Diagnosis not present

## 2022-05-07 ENCOUNTER — Ambulatory Visit: Payer: Medicare HMO | Attending: Interventional Cardiology

## 2022-05-07 DIAGNOSIS — E782 Mixed hyperlipidemia: Secondary | ICD-10-CM | POA: Diagnosis not present

## 2022-05-07 LAB — HEPATIC FUNCTION PANEL
ALT: 21 IU/L (ref 0–32)
AST: 25 IU/L (ref 0–40)
Albumin: 4.5 g/dL (ref 3.7–4.7)
Alkaline Phosphatase: 74 IU/L (ref 44–121)
Bilirubin Total: 0.5 mg/dL (ref 0.0–1.2)
Bilirubin, Direct: 0.15 mg/dL (ref 0.00–0.40)
Total Protein: 6.5 g/dL (ref 6.0–8.5)

## 2022-05-07 LAB — LIPID PANEL
Chol/HDL Ratio: 3.5 ratio (ref 0.0–4.4)
Cholesterol, Total: 191 mg/dL (ref 100–199)
HDL: 54 mg/dL (ref >39)
LDL Chol Calc (NIH): 115 mg/dL — ABNORMAL HIGH (ref 0–99)
Triglycerides: 125 mg/dL (ref 0–149)
VLDL Cholesterol Cal: 22 mg/dL (ref 5–40)

## 2022-05-11 ENCOUNTER — Telehealth: Payer: Self-pay | Admitting: Interventional Cardiology

## 2022-05-11 DIAGNOSIS — Z79899 Other long term (current) drug therapy: Secondary | ICD-10-CM

## 2022-05-11 DIAGNOSIS — E782 Mixed hyperlipidemia: Secondary | ICD-10-CM

## 2022-05-11 MED ORDER — ROSUVASTATIN CALCIUM 20 MG PO TABS: 20.0000 mg | ORAL_TABLET | Freq: Every day | ORAL | 3 refills | Status: DC

## 2022-05-11 NOTE — Telephone Encounter (Signed)
-----   Message from Belva Crome, MD sent at 05/08/2022 11:14 AM EST ----- Let the patient know the LDL does not meet target levels. LDL needs to be < 70. Needs to increase Rosuvastatin to 20 mg daily and repeat Lipid and liver in 6 weeks. Needs cardiology f/u in 3 months with APP and to establish long term management. A copy will be sent to Stacie Glaze, DO

## 2022-05-11 NOTE — Telephone Encounter (Signed)
Spoke with patient and discussed lab results.  Per Dr. Tamala Julian: Let the patient know the LDL does not meet target levels. LDL needs to be < 70. Needs to increase Rosuvastatin to 20 mg daily and repeat Lipid and liver in 6 weeks. Needs cardiology f/u in 3 months with APP and to establish long term management.   Rosuvastatin '20mg'$  QD sent to pharmacy of choice.  Liver/lipid panel ordered with lab appt scheduled for 06/28/2022.  F/U appt scheduled with Ambrose Pancoast, NP on 08/10/2022 at 9:15am.  Patient verbalized understanding and expressed appreciation for call.

## 2022-06-28 ENCOUNTER — Other Ambulatory Visit: Payer: Medicare HMO

## 2022-07-01 DIAGNOSIS — R202 Paresthesia of skin: Secondary | ICD-10-CM | POA: Diagnosis not present

## 2022-07-01 DIAGNOSIS — E7800 Pure hypercholesterolemia, unspecified: Secondary | ICD-10-CM | POA: Diagnosis not present

## 2022-07-01 DIAGNOSIS — I10 Essential (primary) hypertension: Secondary | ICD-10-CM | POA: Diagnosis not present

## 2022-07-01 DIAGNOSIS — E538 Deficiency of other specified B group vitamins: Secondary | ICD-10-CM | POA: Diagnosis not present

## 2022-08-09 NOTE — Progress Notes (Unsigned)
Office Visit    Patient Name: Ashley Mayer Date of Encounter: 08/10/2022  Primary Care Provider:  Caren Macadam, MD Primary Cardiologist:  None Primary Electrophysiologist: None  Chief Complaint    Ashley Mayer is a 82 y.o. female with PMH of HTN, HLD, CKD stage III presents today for 26-monthfollow-up.  Past Medical History    Past Medical History:  Diagnosis Date   Anticipatory grief    Arthritis    "hands" (09/17/2013)   Celiac disease    CKD (chronic kidney disease), stage III (HCC)    Collagenous colitis    Dyspnea on exertion    Estrogen deficiency    GERD (gastroesophageal reflux disease)    Hand arthritis    Hearing loss    Hyperlipidemia    Hypertension    IBS (irritable bowel syndrome)    Low vitamin B12 level    Multiple food allergies    Nocturnal leg cramps    Osteopenia    Sleep disturbance    Unspecified vitamin D deficiency    Varicose veins of bilateral lower extremities with pain    Past Surgical History:  Procedure Laterality Date   ANTERIOR AND POSTERIOR REPAIR  05/2009   BLADDER SUSPENSION  05/2009   CATARACT EXTRACTION W/ INTRAOCULAR LENS  IMPLANT, BILATERAL Bilateral 2000's   CHOLECYSTECTOMY  2000's   VAGINAL HYSTERECTOMY  ~ 1977    Allergies  Allergies  Allergen Reactions   Codeine Other (See Comments)    hallucinations   Lactose Intolerance (Gi) Diarrhea   Milk (Cow)     Other reaction(s): Not available   Shrimp Extract Allergy Skin Test     Other reaction(s): Not available   Tomato Diarrhea   Wheat Bran Diarrhea   Morphine And Related Other (See Comments)    Unknown-family history of allergy   Tape Itching and Rash    History of Present Illness    [name]  is a 82year old female with the above mention past medical history who presents today for 666-monthollow-up of hypertension.  She was seen in 2015 for complaint of chest pain.  Serial troponins and EKG were normal found to be purely  musculoskeletal.  No cardiac workup was advised at that time.  She was seen by Dr. SmTamala Juliann 03/02/2022 by referral from PCP for complaint of DOE.  She reported some mild lower extremity swelling and reported exertional dyspnea ongoing for 6 months.  She was sent for 2D echo and coronary calcium score to rule out possible ischemic cause.  2D echo showed EF of 50-55% with no RWMA and mildly dilated LV with normal right ventricular systolic function and mild to moderate MV regurgitation.  Cardiac CTA showed calcium score 573 with recommendation of follow-up perfusion study due to elevated calcium score.  Patient's LDL cholesterol was above goal and she was started on rosuvastatin 20 mg daily.   Ms. CoStanforthresents today for follow-up of hypertension and coronary artery disease.  Since last being seen in the office patient reports that she has been experiencing elevated blood pressures due to increased life stressors.  She reports that her sister died at or around Christmas time and her son recently was diagnosed with oral cancer.  She also had a sister that suffered a catastrophic fall and injured her shoulder and leg.  When reviewing her medications she reports that she is currently not taking any blood pressure medications.  She was a poor historian and could  not give me a really clear answer of why she stopped her blood pressure medications.  She also reports taking her aspirin 3-4 times per week due to increased bruising.  She denies any angina or chest discomfort.  She also stopped taking her Crestor due to increased difficulty sleeping and pain in her legs.  We discussed the importance of communicating with her healthcare providers when discontinuing medications.  Patient denies chest pain, palpitations, dyspnea, PND, orthopnea, nausea, vomiting, dizziness, syncope, edema, weight gain, or early satiety.   Home Medications    Current Outpatient Medications  Medication Sig Dispense Refill   atorvastatin  (LIPITOR) 10 MG tablet Take 1 tablet (10 mg total) by mouth daily. 30 tablet 3   Calcium & Magnesium Carbonates (MYLANTA PO) Take 1 tablet by mouth as needed.     Cholecalciferol (VITAMIN D3) 1000 units CAPS Take 1 tablet by mouth 2 (two) times daily.     Coenzyme Q10 (Ashley Mayer Q-10) 100 MG CAPS Take by mouth.     Cyanocobalamin (VITAMIN B-12 PO) Take 2,500 mg by mouth 2 (two) times daily.     fluticasone (FLONASE) 50 MCG/ACT nasal spray Place 2 sprays into both nostrils daily. 16 g 0   MAGNESIUM CITRATE PO Take 250 mg by mouth daily at 12 noon.     multivitamin-lutein (OCUVITE-LUTEIN) CAPS Take 1 capsule by mouth daily.     OVER THE COUNTER MEDICATION Patient rotates OTC Nexium ,Tagamet and Prevacid for indigestion.     Probiotic Product (PROBIOTIC DAILY PO) Take by mouth.     Pyridoxine HCl (VITAMIN B6 PO) Take 100 mg by mouth daily at 12 noon.     telmisartan (MICARDIS) 20 MG tablet Take 1/2 to 1 tablet daily as directed for BP 90 tablet 1   VITAMIN A PO Take 2,400 mcg by mouth daily at 12 noon.     Zinc 50 MG TABS Take 1 tablet by mouth daily.     chlorthalidone (HYGROTON) 25 MG tablet Take 1 tablet (25 mg total) by mouth daily. 30 tablet 1   No current facility-administered medications for this visit.     Review of Systems  Please see the history of present illness.    (+) Anxiety (+) Left shoulder pain  All other systems reviewed and are otherwise negative except as noted above.  Physical Exam    Wt Readings from Last 3 Encounters:  08/10/22 132 lb 9.6 oz (60.1 kg)  03/02/22 128 lb 9.6 oz (58.3 kg)  02/26/22 129 lb 3.2 oz (58.6 kg)   VS: Vitals:   08/10/22 0915 08/10/22 1230  BP: (!) 160/70 (!) 158/68  SpO2: 99%   ,Body mass index is 24.25 kg/m.  Constitutional:      Appearance: Healthy appearance. Not in distress.  Neck:     Vascular: JVD normal.  Pulmonary:     Effort: Pulmonary effort is normal.     Breath sounds: No wheezing. No rales. Diminished in the  bases Cardiovascular:     Normal rate. Regular rhythm. Normal S1. Normal S2.      Murmurs: There is no murmur.  Edema:    Peripheral edema absent.  Abdominal:     Palpations: Abdomen is soft non tender. There is no hepatomegaly.  Skin:    General: Skin is warm and dry.  Neurological:     General: No focal deficit present.     Mental Status: Alert and oriented to person, place and time.     Cranial Nerves: Cranial  nerves are intact.  EKG/LABS/ Recent Cardiac Studies    ECG personally reviewed by me today -none completed today   Lab Results  Component Value Date   WBC 6.0 03/10/2018   HGB 11.6 (L) 03/10/2018   HCT 34.0 (L) 03/10/2018   MCV 96.3 03/10/2018   PLT 304 03/10/2018   Lab Results  Component Value Date   CREATININE 1.05 (H) 03/10/2018   BUN 28 (H) 03/10/2018   NA 133 (L) 03/10/2018   K 4.8 03/10/2018   CL 100 03/10/2018   CO2 28 03/10/2018   Lab Results  Component Value Date   ALT 21 05/07/2022   AST 25 05/07/2022   ALKPHOS 74 05/07/2022   BILITOT 0.5 05/07/2022   Lab Results  Component Value Date   CHOL 191 05/07/2022   HDL 54 05/07/2022   LDLCALC 115 (H) 05/07/2022   TRIG 125 05/07/2022   CHOLHDL 3.5 05/07/2022    Lab Results  Component Value Date   HGBA1C 4.9 08/19/2016    Cardiac Studies & Procedures       ECHOCARDIOGRAM  ECHOCARDIOGRAM COMPLETE 03/22/2022  Narrative ECHOCARDIOGRAM REPORT    Patient Name:   Ashley Mayer Date of Exam: 03/22/2022 Medical Rec #:  [id number]          Height:       62.0 in Accession #:    [id number]         Weight:       128.6 lb Date of Birth:  18-Oct-1940          BSA:          1.585 m Patient Age:    41 years           BP:           167/72 mmHg Patient Gender: F                  HR:           60 bpm. Exam Location:  [place]  Procedure: 2D Echo, Cardiac Doppler, Color Doppler and Strain Analysis  Indications:    R06.09 Dyspnea  History:        Patient has no prior history of  Echocardiogram examinations. CKD; Risk Factors:Hypertension and HLD.  Sonographer:    Marygrace Drought RCS Referring Phys: [place]   1. Left ventricular ejection fraction, by estimation, is 50 to 55%. The left ventricle has low normal function. The left ventricle has no regional wall motion abnormalities. The left ventricular internal cavity size was mildly dilated. Left ventricular diastolic parameters were normal. The average left ventricular global longitudinal strain is -19.1 %. The global longitudinal strain is normal. 2. Right ventricular systolic function is normal. The right ventricular size is normal. There is normal pulmonary artery systolic pressure. 3. The mitral valve is abnormal. Mild to moderate mitral valve regurgitation. No evidence of mitral stenosis. 4. The aortic valve is tricuspid. There is mild calcification of the aortic valve. There is mild thickening of the aortic valve. Aortic valve regurgitation is trivial. Aortic valve sclerosis is present, with no evidence of aortic valve stenosis. 5. The inferior vena cava is normal in size with greater than 50% respiratory variability, suggesting right atrial pressure of 3 mmHg.  FINDINGS Left Ventricle: Left ventricular ejection fraction, by estimation, is 50 to 55%. The left ventricle has low normal function. The left ventricle has no regional wall motion abnormalities. The average left ventricular global longitudinal strain  is -19.1 %. The global longitudinal strain is normal. The left ventricular internal cavity size was mildly dilated. There is no left ventricular hypertrophy. Left ventricular diastolic parameters were normal.  Right Ventricle: The right ventricular size is normal. No increase in right ventricular wall thickness. Right ventricular systolic function is normal. There is normal pulmonary artery systolic pressure. The tricuspid regurgitant velocity is 2.74 m/s, and with an assumed right  atrial pressure of 3 mmHg, the estimated right ventricular systolic pressure is 123456 mmHg.  Left Atrium: Left atrial size was normal in size.  Right Atrium: Right atrial size was normal in size.  Pericardium: There is no evidence of pericardial effusion.  Mitral Valve: The mitral valve is abnormal. There is mild thickening of the mitral valve leaflet(s). There is mild calcification of the mitral valve leaflet(s). Mild to moderate mitral valve regurgitation. No evidence of mitral valve stenosis.  Tricuspid Valve: The tricuspid valve is normal in structure. Tricuspid valve regurgitation is trivial. No evidence of tricuspid stenosis.  Aortic Valve: The aortic valve is tricuspid. There is mild calcification of the aortic valve. There is mild thickening of the aortic valve. Aortic valve regurgitation is trivial. Aortic regurgitation PHT measures 433 msec. Aortic valve sclerosis is present, with no evidence of aortic valve stenosis.  Pulmonic Valve: The pulmonic valve was normal in structure. Pulmonic valve regurgitation is mild. No evidence of pulmonic stenosis.  Aorta: The aortic root is normal in size and structure.  Venous: The inferior vena cava is normal in size with greater than 50% respiratory variability, suggesting right atrial pressure of 3 mmHg.  IAS/Shunts: No atrial level shunt detected by color flow Doppler.   LEFT VENTRICLE PLAX 2D LVIDd:         4.00 cm   Diastology LVIDs:         2.80 cm   LV e' medial:    9.68 cm/s LV PW:         1.00 cm   LV E/e' medial:  12.2 LV IVS:        0.90 cm   LV e' lateral:   12.90 cm/s LVOT diam:     1.90 cm   LV E/e' lateral: 9.1 LV SV:         51 LV SV Index:   32        2D Longitudinal Strain LVOT Area:     2.84 cm  2D Strain GLS Avg:     -19.1 %   RIGHT VENTRICLE RV Basal diam:  3.40 cm RV S prime:     11.50 cm/s TAPSE (M-mode): 2.6 cm RVSP:           33.0 mmHg  LEFT ATRIUM           Index        RIGHT ATRIUM           Index LA  diam:      3.60 cm 2.27 cm/m   RA Pressure: 3.00 mmHg LA Vol (A2C): 50.8 ml 32.06 ml/m  RA Area:     11.80 cm LA Vol (A4C): 28.6 ml 18.05 ml/m  RA Volume:   25.70 ml  16.22 ml/m AORTIC VALVE LVOT Vmax:   65.50 cm/s LVOT Vmean:  45.400 cm/s LVOT VTI:    0.179 m AI PHT:      433 msec  AORTA Ao Root diam: 2.90 cm Ao Asc diam:  3.00 cm  MITRAL VALVE  TRICUSPID VALVE MV Area (PHT):                TR Peak grad:   30.0 mmHg MV Decel Time:                TR Vmax:        274.00 cm/s MR Peak grad:    94.5 mmHg    Estimated RAP:  3.00 mmHg MR Mean grad:    66.0 mmHg    RVSP:           33.0 mmHg MR Vmax:         486.00 cm/s MR Vmean:        379.0 cm/s   SHUNTS MR PISA:         1.90 cm     Systemic VTI:  0.18 m MR PISA Eff ROA: 12 mm       Systemic Diam: 1.90 cm MR PISA Radius:  0.55 cm MV E velocity: 118.00 cm/s MV A velocity: 53.50 cm/s MV E/A ratio:  2.21  Jenkins Rouge MD Electronically signed by Jenkins Rouge MD Signature Date/Time: 03/22/2022/11:29:57 AM    Final     CT SCANS  CT CARDIAC SCORING (SELF PAY ONLY) 03/16/2022  Addendum 03/16/2022  4:21 PM ADDENDUM REPORT: 03/16/2022 16:19  CLINICAL DATA:  Cardiac risk stratification.  EXAM: OVER-READ INTERPRETATION  CT CHEST  The following report is an over-read performed by radiologist Dr. [facility] on 03/16/2022. This over-read does not include interpretation of cardiac or coronary anatomy or pathology. The coronary calcium score interpretation by the cardiologist is attached.  COMPARISON:  None.  FINDINGS: The ascending thoracic aorta is normal in caliber. There are calcifications noted around the aortic valve.  No mediastinal or hilar mass or lymphadenopathy. The esophagus is grossly normal. There is a small hiatal hernia noted.  Mild emphysematous changes and pulmonary scarring but no infiltrates or effusions. No pulmonary lesions.  No significant upper  abdominal findings.  No worrisome breast lesions.  No significant bony findings. There is a T9 compression deformity which appears remote.  IMPRESSION: 1. No significant extracardiac findings. 2. Mild emphysematous changes and pulmonary scarring. 3. Small hiatal hernia. 4. T9 compression deformity appears remote. 5. Aortic atherosclerosis.  Aortic Atherosclerosis (ICD10-I70.0) and Emphysema (ICD10-J43.9).   Electronically Signed By: Marijo Sanes M.D. On: 03/16/2022 16:19  Narrative CLINICAL DATA:  Risk stratification  EXAM: Coronary Calcium Score  TECHNIQUE: The patient was scanned on a Siemens Somatom 64 slice scanner. Axial non-contrast 7m slices were carried out through the heart. The data set was analyzed on a dedicated work station and scored using the [place]  FINDINGS: Non-cardiac: No significant non cardiac findings on limited lung and soft tissue windows. [facility]  Ascending Aorta: Normal diameter 2.9 cm  Pericardium: Normal  Coronary arteries: Likely left dominant LM and 2 vessel calcium  LM 52.1  LAD 306  LCX 215  RCA 0  Total 573  IMPRESSION: Coronary calcium score of 573. This was 769th percentile for age and sex matched control.  Consider f/u perfusion study given elevated calcium score  PJenkins Rouge Electronically Signed: By: PJenkins RougeM.D. On: 03/16/2022 11:44          Assessment & Plan    1.  Essential hypertension: -HYPERTENSION CONTROL Vitals:   08/10/22 0915 08/10/22 1230  BP: (!) 160/70 (!) 158/68    The patient's blood pressure is elevated above target  today.  In order to address the patient's elevated BP: A new medication was prescribed today.; Blood pressure will be monitored at home to determine if medication changes need to be made.; Follow up with general cardiology has been recommended.     -We will restart chlorthalidone 25 mg daily -She will have BMET in  2 weeks and was instructed to monitor blood pressures and report back to our office. -We will plan to add additional medications as needed for blood pressures greater than 130/80.  2.  Hyperlipidemia: -Patient's last LDL cholesterol was 73 -She was recently started on Crestor 20 mg however she self discontinued due to myalgias.   -We will start her on atorvastatin 10 mg  3.  Dyspnea on exertion: -Patient underwent 2D echo that showed normal EF and mild to moderate MVR -Today patient reports no shortness of breath with exertion.  4.  Nonobstructive CAD: -CT calcium scoring completed showing calcium score of 573 -Patient was started on statin therapy however discontinued and will be started on atorvastatin 10 mg daily -She denies any chest pain since her previous visit.  Disposition: Follow-up with None or APP in 1 months    Medication Adjustments/Labs and Tests Ordered: Current medicines are reviewed at length with the patient today.  Concerns regarding medicines are outlined above.   Signed, Mable Fill, Marissa Nestle, NP 08/10/2022, 12:31 PM [facility]  Note:  This document was prepared using Dragon voice recognition software and may include unintentional dictation errors.

## 2022-08-10 ENCOUNTER — Ambulatory Visit: Payer: Medicare HMO | Attending: Nurse Practitioner | Admitting: Nurse Practitioner

## 2022-08-10 ENCOUNTER — Encounter: Payer: Self-pay | Admitting: Nurse Practitioner

## 2022-08-10 VITALS — BP 158/68 | Ht 62.0 in | Wt 132.6 lb

## 2022-08-10 DIAGNOSIS — I2510 Atherosclerotic heart disease of native coronary artery without angina pectoris: Secondary | ICD-10-CM | POA: Diagnosis not present

## 2022-08-10 DIAGNOSIS — I10 Essential (primary) hypertension: Secondary | ICD-10-CM | POA: Diagnosis not present

## 2022-08-10 DIAGNOSIS — E782 Mixed hyperlipidemia: Secondary | ICD-10-CM

## 2022-08-10 DIAGNOSIS — R0609 Other forms of dyspnea: Secondary | ICD-10-CM

## 2022-08-10 MED ORDER — CHLORTHALIDONE 25 MG PO TABS: 25.0000 mg | ORAL_TABLET | Freq: Every day | ORAL | 1 refills | Status: DC

## 2022-08-10 MED ORDER — ATORVASTATIN CALCIUM 10 MG PO TABS: 10.0000 mg | ORAL_TABLET | Freq: Every day | ORAL | 3 refills | Status: DC

## 2022-08-10 NOTE — Patient Instructions (Addendum)
Medication Instructions:  RESTART Chlorthalidone '25mg'$  Take 1 tablet once a day  START Atorvastatin '10mg'$  take 1 tablet once a day  *If you need a refill on your cardiac medications before your next appointment, please call your pharmacy*   Lab Work: 2 weeks BMET If you have labs (blood work) drawn today and your tests are completely normal, you will receive your results only by: [place] (if you have MyChart) OR A paper copy in the mail If you have any lab test that is abnormal or we need to change your treatment, we will call you to review the results.   Testing/Procedures: None ordered   Follow-Up: At [facility], you and your health needs are our priority.  As part of our continuing mission to provide you with exceptional heart care, we have created designated Provider Care Teams.  These Care Teams include your primary Cardiologist (physician) and Advanced Practice Providers (APPs -  Physician Assistants and Nurse Practitioners) who all work together to provide you with the care you need, when you need it.  We recommend signing up for the patient portal called "MyChart".  Sign up information is provided on this After Visit Summary.  MyChart is used to connect with patients for Virtual Visits (Telemedicine).  Patients are able to view lab/test results, encounter notes, upcoming appointments, etc.  Non-urgent messages can be sent to your provider as well.   To learn more about what you can do with MyChart, go to [email or web address].    Your next appointment:   1 month(s)  Provider:   Ambrose Pancoast, NP       Other Instructions Check your blood pressure daily for 2 weeks, then contact the office with your readings.

## 2022-08-24 ENCOUNTER — Ambulatory Visit: Payer: Medicare HMO | Attending: Nurse Practitioner

## 2022-08-24 DIAGNOSIS — E782 Mixed hyperlipidemia: Secondary | ICD-10-CM

## 2022-08-24 DIAGNOSIS — I10 Essential (primary) hypertension: Secondary | ICD-10-CM

## 2022-08-24 DIAGNOSIS — R0609 Other forms of dyspnea: Secondary | ICD-10-CM | POA: Diagnosis not present

## 2022-08-24 DIAGNOSIS — I2510 Atherosclerotic heart disease of native coronary artery without angina pectoris: Secondary | ICD-10-CM

## 2022-08-25 ENCOUNTER — Other Ambulatory Visit: Payer: Self-pay

## 2022-08-25 LAB — BASIC METABOLIC PANEL
BUN/Creatinine Ratio: 22 (ref 12–28)
BUN: 22 mg/dL (ref 8–27)
CO2: 24 mmol/L (ref 20–29)
Calcium: 10.2 mg/dL (ref 8.7–10.3)
Chloride: 94 mmol/L — ABNORMAL LOW (ref 96–106)
Creatinine, Ser: 1 mg/dL (ref 0.57–1.00)
Glucose: 70 mg/dL (ref 70–99)
Potassium: 4.2 mmol/L (ref 3.5–5.2)
Sodium: 135 mmol/L (ref 134–144)
eGFR: 56 mL/min/{1.73_m2} — ABNORMAL LOW (ref >59)

## 2022-09-13 NOTE — Progress Notes (Unsigned)
Office Visit    Patient Name: Ashley Mayer Date of Encounter: 09/13/2022  Primary Care Provider:  Aliene Beams, MD Primary Cardiologist:  None Primary Electrophysiologist: None  Chief Complaint    Ashley Mayer is a 82 y.o. female with PMH of HTN, HLD, CKD stage III presents today for 1 month follow-up of essential hypertension.  Past Medical History    Past Medical History:  Diagnosis Date   Anticipatory grief    Arthritis    "hands" (09/17/2013)   Celiac disease    CKD (chronic kidney disease), stage III (HCC)    Collagenous colitis    Dyspnea on exertion    Estrogen deficiency    GERD (gastroesophageal reflux disease)    Hand arthritis    Hearing loss    Hyperlipidemia    Hypertension    IBS (irritable bowel syndrome)    Low vitamin B12 level    Multiple food allergies    Nocturnal leg cramps    Osteopenia    Sleep disturbance    Unspecified vitamin D deficiency    Varicose veins of bilateral lower extremities with pain    Past Surgical History:  Procedure Laterality Date   ANTERIOR AND POSTERIOR REPAIR  05/2009   BLADDER SUSPENSION  05/2009   CATARACT EXTRACTION W/ INTRAOCULAR LENS  IMPLANT, BILATERAL Bilateral 2000's   CHOLECYSTECTOMY  2000's   VAGINAL HYSTERECTOMY  ~ 1977    Allergies  Allergies  Allergen Reactions   Codeine Other (See Comments)    hallucinations   Lactose Intolerance (Gi) Diarrhea   Milk (Cow)     Other reaction(s): Not available   Shrimp Extract     Other reaction(s): Not available   Tomato Diarrhea   Wheat Diarrhea   Morphine And Related Other (See Comments)    Unknown-family history of allergy   Tape Itching and Rash    History of Present Illness    Ashley Mayer  is a 82 year old female with the above mention past medical history who presents today for 64-month follow-up of hypertension.  She was seen in 2015 for complaint of chest pain.  Serial troponins and EKG were normal found to be purely  musculoskeletal.  No cardiac workup was advised at that time.  She was seen by Dr. Katrinka Blazing on 03/02/2022 by referral from PCP for complaint of DOE.  She reported some mild lower extremity swelling and reported exertional dyspnea ongoing for 6 months.  She was sent for 2D echo and coronary calcium score to rule out possible ischemic cause.  2D echo showed EF of 50-55% with no RWMA and mildly dilated LV with normal right ventricular systolic function and mild to moderate MV regurgitation.  Cardiac CTA showed calcium score 573 with recommendation of follow-up perfusion study due to elevated calcium score.  Patient's LDL cholesterol was above goal and she was started on rosuvastatin 20 mg daily.  She was seen for follow-up on 08/10/2022.  During her visit her blood pressure was elevated due to patient discontinuing her chlorthalidone.  She was instructed to restart chlorthalidone at that time.  She endorses no shortness of breath similar to her previous visit and patient was started on atorvastatin after self discontinuing.   Ashley Mayer presents today for 1 month follow-up of hypertension.  Since last being seen in the office patient reports she has been doing well with no new cardiac complaints.  Her blood pressure today however is elevated at 140/64.  She did provide  a log of blood pressures that have been running in the 120s to 130s systolically.  She feels that her blood pressures are associated with possible whitecoat syndrome.  During her previous visit patient discontinued her Crestor due to myalgias.  During today's visit patient brought in her prescriptions and was still taking Crestor.  She had complaints of myalgias as well.  I advised her to discontinue Crestor and pick up prescription for atorvastatin today.  She reports also straining her left bicep while planting [employer]'s.  Patient denies chest pain, palpitations, dyspnea, PND, orthopnea, nausea, vomiting, dizziness, syncope, edema, weight gain, or early  satiety.    Home Medications    Current Outpatient Medications  Medication Sig Dispense Refill   atorvastatin (LIPITOR) 10 MG tablet Take 1 tablet (10 mg total) by mouth daily. 30 tablet 3   Calcium & Magnesium Carbonates (MYLANTA PO) Take 1 tablet by mouth as needed.     chlorthalidone (HYGROTON) 25 MG tablet Take 1 tablet (25 mg total) by mouth daily. 30 tablet 1   Cholecalciferol (VITAMIN D3) 1000 units CAPS Take 1 tablet by mouth 2 (two) times daily.     Coenzyme Q10 (CO Q-10) 100 MG CAPS Take by mouth.     Cyanocobalamin (VITAMIN B-12 PO) Take 2,500 mg by mouth 2 (two) times daily.     fluticasone (FLONASE) 50 MCG/ACT nasal spray Place 2 sprays into both nostrils daily. 16 g 0   MAGNESIUM CITRATE PO Take 250 mg by mouth daily at 12 noon.     multivitamin-lutein (OCUVITE-LUTEIN) CAPS Take 1 capsule by mouth daily.     OVER THE COUNTER MEDICATION Patient rotates OTC Nexium ,Tagamet and Prevacid for indigestion.     Probiotic Product (PROBIOTIC DAILY PO) Take by mouth.     Pyridoxine HCl (VITAMIN B6 PO) Take 100 mg by mouth daily at 12 noon.     telmisartan (MICARDIS) 20 MG tablet Take 1/2 to 1 tablet daily as directed for BP 90 tablet 1   VITAMIN A PO Take 2,400 mcg by mouth daily at 12 noon.     Zinc 50 MG TABS Take 1 tablet by mouth daily.     No current facility-administered medications for this visit.     Review of Systems  Please see the history of present illness.    (+) Leg cramps (+) Pulled muscle and left arm  All other systems reviewed and are otherwise negative except as noted above.  Physical Exam    Wt Readings from Last 3 Encounters:  08/10/22 132 lb 9.6 oz (60.1 kg)  03/02/22 128 lb 9.6 oz (58.3 kg)  02/26/22 129 lb 3.2 oz (58.6 kg)   ZH:YQMVH were no vitals filed for this visit.,There is no height or weight on file to calculate BMI.  Constitutional:      Appearance: Healthy appearance. Not in distress.  Neck:     Vascular: JVD normal.  Pulmonary:      Effort: Pulmonary effort is normal.     Breath sounds: No wheezing. No rales. Diminished in the bases Cardiovascular:     Normal rate. Regular rhythm. Normal S1. Normal S2.      Murmurs: There is no murmur.  Edema:    Peripheral edema absent.  Abdominal:     Palpations: Abdomen is soft non tender. There is no hepatomegaly.  Skin:    General: Skin is warm and dry.  Neurological:     General: No focal deficit present.     Mental Status:  Alert and oriented to person, place and time.     Cranial Nerves: Cranial nerves are intact.  EKG/LABS/ Recent Cardiac Studies    ECG personally reviewed by me today -none completed today   Lab Results  Component Value Date   WBC 6.0 03/10/2018   HGB 11.6 (L) 03/10/2018   HCT 34.0 (L) 03/10/2018   MCV 96.3 03/10/2018   PLT 304 03/10/2018   Lab Results  Component Value Date   CREATININE 1.00 08/24/2022   BUN 22 08/24/2022   NA 135 08/24/2022   K 4.2 08/24/2022   CL 94 (L) 08/24/2022   CO2 24 08/24/2022   Lab Results  Component Value Date   ALT 21 05/07/2022   AST 25 05/07/2022   ALKPHOS 74 05/07/2022   BILITOT 0.5 05/07/2022   Lab Results  Component Value Date   CHOL 191 05/07/2022   HDL 54 05/07/2022   LDLCALC 115 (H) 05/07/2022   TRIG 125 05/07/2022   CHOLHDL 3.5 05/07/2022    Lab Results  Component Value Date   HGBA1C 4.9 08/19/2016    Cardiac Studies & Procedures       ECHOCARDIOGRAM  ECHOCARDIOGRAM COMPLETE 03/22/2022  Narrative ECHOCARDIOGRAM REPORT    Patient Name:   Kijuana A Killough Date of Exam: 03/22/2022 Medical Rec #:  [id number]          Height:       62.0 in Accession #:    [id number]         Weight:       128.6 lb Date of Birth:  03/13/1941          BSA:          1.585 m Patient Age:    81 years           BP:           167/72 mmHg Patient Gender: F                  HR:           60 bpm. Exam Location:  [address]  Procedure: 2D Echo, Cardiac Doppler, Color Doppler and Strain  Analysis  Indications:    R06.09 Dyspnea  History:        Patient has no prior history of Echocardiogram examinations. CKD; Risk Factors:Hypertension and HLD.  Sonographer:    Clearence Pedammie Crouch RCS Referring Phys: [phone] MARY E BRANCH  IMPRESSIONS   1. Left ventricular ejection fraction, by estimation, is 50 to 55%. The left ventricle has low normal function. The left ventricle has no regional wall motion abnormalities. The left ventricular internal cavity size was mildly dilated. Left ventricular diastolic parameters were normal. The average left ventricular global longitudinal strain is -19.1 %. The global longitudinal strain is normal. 2. Right ventricular systolic function is normal. The right ventricular size is normal. There is normal pulmonary artery systolic pressure. 3. The mitral valve is abnormal. Mild to moderate mitral valve regurgitation. No evidence of mitral stenosis. 4. The aortic valve is tricuspid. There is mild calcification of the aortic valve. There is mild thickening of the aortic valve. Aortic valve regurgitation is trivial. Aortic valve sclerosis is present, with no evidence of aortic valve stenosis. 5. The inferior vena cava is normal in size with greater than 50% respiratory variability, suggesting right atrial pressure of 3 mmHg.  FINDINGS Left Ventricle: Left ventricular ejection fraction, by estimation, is 50 to 55%. The left ventricle has low normal function. The left ventricle  has no regional wall motion abnormalities. The average left ventricular global longitudinal strain is -19.1 %. The global longitudinal strain is normal. The left ventricular internal cavity size was mildly dilated. There is no left ventricular hypertrophy. Left ventricular diastolic parameters were normal.  Right Ventricle: The right ventricular size is normal. No increase in right ventricular wall thickness. Right ventricular systolic function is normal. There is normal pulmonary artery  systolic pressure. The tricuspid regurgitant velocity is 2.74 m/s, and with an assumed right atrial pressure of 3 mmHg, the estimated right ventricular systolic pressure is 33.0 mmHg.  Left Atrium: Left atrial size was normal in size.  Right Atrium: Right atrial size was normal in size.  Pericardium: There is no evidence of pericardial effusion.  Mitral Valve: The mitral valve is abnormal. There is mild thickening of the mitral valve leaflet(s). There is mild calcification of the mitral valve leaflet(s). Mild to moderate mitral valve regurgitation. No evidence of mitral valve stenosis.  Tricuspid Valve: The tricuspid valve is normal in structure. Tricuspid valve regurgitation is trivial. No evidence of tricuspid stenosis.  Aortic Valve: The aortic valve is tricuspid. There is mild calcification of the aortic valve. There is mild thickening of the aortic valve. Aortic valve regurgitation is trivial. Aortic regurgitation PHT measures 433 msec. Aortic valve sclerosis is present, with no evidence of aortic valve stenosis.  Pulmonic Valve: The pulmonic valve was normal in structure. Pulmonic valve regurgitation is mild. No evidence of pulmonic stenosis.  Aorta: The aortic root is normal in size and structure.  Venous: The inferior vena cava is normal in size with greater than 50% respiratory variability, suggesting right atrial pressure of 3 mmHg.  IAS/Shunts: No atrial level shunt detected by color flow Doppler.   LEFT VENTRICLE PLAX 2D LVIDd:         4.00 cm   Diastology LVIDs:         2.80 cm   LV e' medial:    9.68 cm/s LV PW:         1.00 cm   LV E/e' medial:  12.2 LV IVS:        0.90 cm   LV e' lateral:   12.90 cm/s LVOT diam:     1.90 cm   LV E/e' lateral: 9.1 LV SV:         51 LV SV Index:   32        2D Longitudinal Strain LVOT Area:     2.84 cm  2D Strain GLS Avg:     -19.1 %   RIGHT VENTRICLE RV Basal diam:  3.40 cm RV S prime:     11.50 cm/s TAPSE (M-mode): 2.6  cm RVSP:           33.0 mmHg  LEFT ATRIUM           Index        RIGHT ATRIUM           Index LA diam:      3.60 cm 2.27 cm/m   RA Pressure: 3.00 mmHg LA Vol (A2C): 50.8 ml 32.06 ml/m  RA Area:     11.80 cm LA Vol (A4C): 28.6 ml 18.05 ml/m  RA Volume:   25.70 ml  16.22 ml/m AORTIC VALVE LVOT Vmax:   65.50 cm/s LVOT Vmean:  45.400 cm/s LVOT VTI:    0.179 m AI PHT:      433 msec  AORTA Ao Root diam: 2.90 cm Ao Asc diam:  3.00  cm  MITRAL VALVE                  TRICUSPID VALVE MV Area (PHT):                TR Peak grad:   30.0 mmHg MV Decel Time:                TR Vmax:        274.00 cm/s MR Peak grad:    94.5 mmHg    Estimated RAP:  3.00 mmHg MR Mean grad:    66.0 mmHg    RVSP:           33.0 mmHg MR Vmax:         486.00 cm/s MR Vmean:        379.0 cm/s   SHUNTS MR PISA:         1.90 cm     Systemic VTI:  0.18 m MR PISA Eff ROA: 12 mm       Systemic Diam: 1.90 cm MR PISA Radius:  0.55 cm MV E velocity: 118.00 cm/s MV A velocity: 53.50 cm/s MV E/A ratio:  2.21  Ashley Haws MD Electronically signed by Ashley Haws MD Signature Date/Time: 03/22/2022/11:29:57 AM    Final     CT SCANS  CT CARDIAC SCORING (SELF PAY ONLY) 03/16/2022  Addendum 03/16/2022  4:21 PM ADDENDUM REPORT: 03/16/2022 16:19  CLINICAL DATA:  Cardiac risk stratification.  EXAM: OVER-READ INTERPRETATION  CT CHEST  The following report is an over-read performed by radiologist Dr. [facility] on 03/16/2022. This over-read does not include interpretation of cardiac or coronary anatomy or pathology. The coronary calcium score interpretation by the cardiologist is attached.  COMPARISON:  None.  FINDINGS: The ascending thoracic aorta is normal in caliber. There are calcifications noted around the aortic valve.  No mediastinal or hilar mass or lymphadenopathy. The esophagus is grossly normal. There is a small hiatal hernia noted.  Mild emphysematous changes and  pulmonary scarring but no infiltrates or effusions. No pulmonary lesions.  No significant upper abdominal findings.  No worrisome breast lesions.  No significant bony findings. There is a T9 compression deformity which appears remote.  IMPRESSION: 1. No significant extracardiac findings. 2. Mild emphysematous changes and pulmonary scarring. 3. Small hiatal hernia. 4. T9 compression deformity appears remote. 5. Aortic atherosclerosis.  Aortic Atherosclerosis (ICD10-I70.0) and Emphysema (ICD10-J43.9).   Electronically Signed By: Rudie Meyer M.D. On: 03/16/2022 16:19  Narrative CLINICAL DATA:  Risk stratification  EXAM: Coronary Calcium Score  TECHNIQUE: The patient was scanned on a Siemens Somatom 64 slice scanner. Axial non-contrast 97mm slices were carried out through the heart. The data set was analyzed on a dedicated work station and scored using the Agatson method.  FINDINGS: Non-cardiac: No significant non cardiac findings on limited lung and soft tissue windows. See separate report from [facility].  Ascending Aorta: Normal diameter 2.9 cm  Pericardium: Normal  Coronary arteries: Likely left dominant LM and 2 vessel calcium  LM 52.1  LAD 306  LCX 215  RCA 0  Total 573  IMPRESSION: Coronary calcium score of 573. This was 28 th percentile for age and sex matched control.  Consider f/u perfusion study given elevated calcium score  Ashley Mayer  Electronically Signed: By: Ashley Mayer M.D. On: 03/16/2022 11:44          Assessment & Plan    1.  Essential hypertension: -Patient's blood pressure today was  elevated in office at 140/64 however BP at home per her log has been controlled. -Continue chlorthalidone 25 mg daily -She was advised to stay hydrated  2.  Hyperlipidemia: -Patient's last LDL cholesterol was 73 -She was recently started on Crestor 20 mg however she self discontinued due to myalgias.  She was advised to start  atorvastatin 10 mg during previous visit.  She however continued Crestor and has complaints of myalgias. -She will start atorvastatin 10 mg daily today and discontinue Crestor 20 mg daily -She will have lipids and LFTs in 6 weeks.   3.  Dyspnea on exertion: -Patient underwent 2D echo that showed normal EF and mild to moderate MVR -Today patient reports no shortness of breath with exertion.    4.  Nonobstructive CAD: -CT calcium scoring completed showing calcium score of 573 -Patient was started on statin therapy however discontinued and will be started on atorvastatin 10 mg daily -She denies any chest pain since her previous visit.  Disposition: Follow-up with None or APP in 6 months    Medication Adjustments/Labs and Tests Ordered: Current medicines are reviewed at length with the patient today.  Concerns regarding medicines are outlined above.   Signed, Napoleon Form, Leodis Rains, NP 09/13/2022, 1:07 PM [facility]  Note:  This document was prepared using Dragon voice recognition software and may include unintentional dictation errors.

## 2022-09-14 ENCOUNTER — Ambulatory Visit: Payer: Medicare HMO | Attending: Nurse Practitioner | Admitting: Nurse Practitioner

## 2022-09-14 ENCOUNTER — Encounter: Payer: Self-pay | Admitting: Nurse Practitioner

## 2022-09-14 VITALS — BP 140/64 | HR 68 | Ht 62.0 in | Wt 133.0 lb

## 2022-09-14 DIAGNOSIS — R0609 Other forms of dyspnea: Secondary | ICD-10-CM

## 2022-09-14 DIAGNOSIS — I2510 Atherosclerotic heart disease of native coronary artery without angina pectoris: Secondary | ICD-10-CM | POA: Diagnosis not present

## 2022-09-14 DIAGNOSIS — I10 Essential (primary) hypertension: Secondary | ICD-10-CM

## 2022-09-14 DIAGNOSIS — E782 Mixed hyperlipidemia: Secondary | ICD-10-CM | POA: Diagnosis not present

## 2022-09-14 MED ORDER — ATORVASTATIN CALCIUM 10 MG PO TABS: 5.0000 mg | ORAL_TABLET | Freq: Every day | ORAL | 2 refills | Status: DC

## 2022-09-14 NOTE — Patient Instructions (Addendum)
Medication Instructions:  DECREASE Atorvastatin to 5mg  Take 1 tablet daily (or break 10mg  tablet in half) *If you need a refill on your cardiac medications before your next appointment, please call your pharmacy*   Lab Work: 2 WEEKS FASTING LFTs & LIPIDS If you have labs (blood work) drawn today and your tests are completely normal, you will receive your results only by: MyChart Message (if you have MyChart) OR A paper copy in the mail If you have any lab test that is abnormal or we need to change your treatment, we will call you to review the results.   Testing/Procedures: NONE ORDERED   Follow-Up: At [facility], you and your health needs are our priority.  As part of our continuing mission to provide you with exceptional heart care, we have created designated Provider Care Teams.  These Care Teams include your primary Cardiologist (physician) and Advanced Practice Providers (APPs -  Physician Assistants and Nurse Practitioners) who all work together to provide you with the care you need, when you need it.  We recommend signing up for the patient portal called "MyChart".  Sign up information is provided on this After Visit Summary.  MyChart is used to connect with patients for Virtual Visits (Telemedicine).  Patients are able to view lab/test results, encounter notes, upcoming appointments, etc.  Non-urgent messages can be sent to your provider as well.   To learn more about what you can do with MyChart, go to [email or web address].    Your next appointment:   6 month(s)  Provider:   Meriam Sprague, MD  Other Instructions

## 2022-09-15 ENCOUNTER — Telehealth: Payer: Self-pay | Admitting: Nurse Practitioner

## 2022-09-15 NOTE — Telephone Encounter (Signed)
Patient is requesting call back to get clarification on lab appointment and when she is to come back. She states she was told different than what is scheduled now. Please advise.

## 2022-09-15 NOTE — Telephone Encounter (Signed)
Returned call to patient.   Patient states someone had called her last night and told her that she will need to have her labs drawn in 6 weeks vs 2 weeks. Reviewed noted from Robin Searing, NP from yesterday and he notes F/U labs in 6 weeks.  Rescheduled lab appointment for 10/26/22. Patient verbalized understanding and expressed appreciation for assistance.

## 2022-09-28 ENCOUNTER — Other Ambulatory Visit: Payer: Medicare HMO

## 2022-10-26 ENCOUNTER — Ambulatory Visit: Payer: Medicare HMO | Attending: Nurse Practitioner

## 2022-10-26 DIAGNOSIS — I10 Essential (primary) hypertension: Secondary | ICD-10-CM

## 2022-10-26 DIAGNOSIS — E782 Mixed hyperlipidemia: Secondary | ICD-10-CM | POA: Diagnosis not present

## 2022-10-26 DIAGNOSIS — I2510 Atherosclerotic heart disease of native coronary artery without angina pectoris: Secondary | ICD-10-CM | POA: Diagnosis not present

## 2022-10-26 DIAGNOSIS — R0609 Other forms of dyspnea: Secondary | ICD-10-CM | POA: Diagnosis not present

## 2022-10-26 LAB — HEPATIC FUNCTION PANEL
ALT: 18 IU/L (ref 0–32)
AST: 17 IU/L (ref 0–40)
Albumin: 4.3 g/dL (ref 3.7–4.7)
Alkaline Phosphatase: 81 IU/L (ref 44–121)
Bilirubin Total: 0.4 mg/dL (ref 0.0–1.2)
Bilirubin, Direct: 0.11 mg/dL (ref 0.00–0.40)
Total Protein: 6.7 g/dL (ref 6.0–8.5)

## 2022-10-26 LAB — LIPID PANEL
Chol/HDL Ratio: 6.1 ratio — ABNORMAL HIGH (ref 0.0–4.4)
Cholesterol, Total: 249 mg/dL — ABNORMAL HIGH (ref 100–199)
HDL: 41 mg/dL
LDL Chol Calc (NIH): 168 mg/dL — ABNORMAL HIGH (ref 0–99)
Triglycerides: 214 mg/dL — ABNORMAL HIGH (ref 0–149)
VLDL Cholesterol Cal: 40 mg/dL (ref 5–40)

## 2022-10-28 ENCOUNTER — Telehealth: Payer: Self-pay | Admitting: Nurse Practitioner

## 2022-10-28 ENCOUNTER — Other Ambulatory Visit: Payer: Self-pay

## 2022-10-28 DIAGNOSIS — Z79899 Other long term (current) drug therapy: Secondary | ICD-10-CM

## 2022-10-28 DIAGNOSIS — E782 Mixed hyperlipidemia: Secondary | ICD-10-CM

## 2022-10-28 NOTE — Telephone Encounter (Signed)
Patient returned call for her lab results. °

## 2022-10-28 NOTE — Telephone Encounter (Signed)
Spoke to the patient  explained Alden Server, NP recommendations:    -Please increase patient's atorvastatin to 10 mg daily if tolerating and if not we will refer her to the lipid clinic for evaluation and consideration for PCSK9 inhibitor.   -Please advise patient to refrain from foods high in saturated fat such as red meats, dairy, processed foods -Please increase your consumption of high-fiber foods and increase physical activity as tolerated daily with a goal of 150 minutes/week.     Patient stated she stop taking Lipitor several months ago due to muscle pains. Will forward to APP to clarify if   pt will still need referral to lipid clinic.

## 2022-11-03 ENCOUNTER — Other Ambulatory Visit: Payer: Self-pay

## 2022-11-03 DIAGNOSIS — E782 Mixed hyperlipidemia: Secondary | ICD-10-CM

## 2022-11-11 DIAGNOSIS — Z1231 Encounter for screening mammogram for malignant neoplasm of breast: Secondary | ICD-10-CM | POA: Diagnosis not present

## 2022-11-16 DIAGNOSIS — E559 Vitamin D deficiency, unspecified: Secondary | ICD-10-CM | POA: Diagnosis not present

## 2022-11-16 DIAGNOSIS — E7800 Pure hypercholesterolemia, unspecified: Secondary | ICD-10-CM | POA: Diagnosis not present

## 2022-11-16 DIAGNOSIS — Z0000 Encounter for general adult medical examination without abnormal findings: Secondary | ICD-10-CM | POA: Diagnosis not present

## 2022-11-16 DIAGNOSIS — N1831 Chronic kidney disease, stage 3a: Secondary | ICD-10-CM | POA: Diagnosis not present

## 2022-11-16 DIAGNOSIS — E538 Deficiency of other specified B group vitamins: Secondary | ICD-10-CM | POA: Diagnosis not present

## 2022-11-16 DIAGNOSIS — J439 Emphysema, unspecified: Secondary | ICD-10-CM | POA: Diagnosis not present

## 2022-11-16 DIAGNOSIS — I10 Essential (primary) hypertension: Secondary | ICD-10-CM | POA: Diagnosis not present

## 2022-11-16 DIAGNOSIS — I700 Atherosclerosis of aorta: Secondary | ICD-10-CM | POA: Diagnosis not present

## 2022-11-16 DIAGNOSIS — R0609 Other forms of dyspnea: Secondary | ICD-10-CM | POA: Diagnosis not present

## 2022-11-16 DIAGNOSIS — K219 Gastro-esophageal reflux disease without esophagitis: Secondary | ICD-10-CM | POA: Diagnosis not present

## 2022-11-16 DIAGNOSIS — Z79899 Other long term (current) drug therapy: Secondary | ICD-10-CM | POA: Diagnosis not present

## 2022-11-25 ENCOUNTER — Ambulatory Visit: Payer: Medicare HMO | Attending: Internal Medicine | Admitting: Pharmacist

## 2022-11-25 DIAGNOSIS — E782 Mixed hyperlipidemia: Secondary | ICD-10-CM

## 2022-11-25 MED ORDER — ROSUVASTATIN CALCIUM 5 MG PO TABS: 5.0000 mg | ORAL_TABLET | ORAL | 3 refills | Status: DC

## 2022-11-25 NOTE — Progress Notes (Signed)
Patient ID: Ashley Mayer                 DOB: Jul 23, 1940                    MRN: [id number]      HPI: Ashley Mayer is a 82 y.o. female patient referred to lipid clinic by  Robin Searing, NP. PMH is significant for HTN, HLD, CKD stage III. Cardiac CTA showed calcium score 573 with recommendation of follow-up perfusion study due to elevated calcium score.   Patient presents to lipid clinic.  She was initially started on rosuvastatin 20 mg this was stopped due to muscle pains.  Then changed to atorvastatin 10 mg daily which also caused leg pain.  Currently patient is taking atorvastatin 10 mg every other day.  She is doing better on this but has leg pain on the days that she takes the atorvastatin.  She is very active and is still working for [employer].  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  States that she does a fair amount of walking.  Used to do squats and leg raises half push-ups until her legs started hurting so bad from the statin.  We reviewed medication options including PCSK9 or trying a lower dose and different statin.  Discussed the cost and efficacy of PCSK9i.  Patient would not be able to afford without assistance.  Current Medications: Atorvastatin 10 mg every other day Intolerances: rosuvastatin 20mg , atorvastatin 10mg  (myalgias) Risk Factors: age, CKD, elevated CAC score LDL-C goal: <55 (ACE), <70 (ACC) ApoB goal: <70  Diet: allergic to wheat, milk, tomatoes and shrimp Mainly chicken, pork. 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 Use to walk and do squats, push ups,   Family History:  Family History  Problem Relation Age of Onset   Stroke Mother    Hyperlipidemia Mother    Cancer Father        mesothelioma   Diabetes Sister    Heart disease Brother    Hyperlipidemia Brother     Hypertension Brother    Stroke Brother    Hemochromatosis Son    Cancer Brother        bladder with mets to kidneys   Stroke Other    Hypertension Other    Hyperlipidemia Other    Cancer Other    Asthma Other    Heart attack Other      Social History:  Social History   Socioeconomic History   Marital status: Single    Spouse name: Not on file   Number of children: Not on file   Years of education: Not on file   Highest education level: Not on file  Occupational History   Not on file  Tobacco Use   Smoking status: Never   Smokeless tobacco: Never  Substance and Sexual Activity   Alcohol use: No   Drug use: No   Sexual activity: Never  Other Topics Concern   Not on file  Social History Narrative   Not on file   Social Determinants of Health   Financial Resource Strain: Not on file  Food Insecurity: Not on file  Transportation Needs: Not on file  Physical Activity: Not on file  Stress: Not on file  Social Connections: Not on file  Intimate Partner Violence: Not on  file     Labs: Lipid Panel     Component Value Date/Time   CHOL 249 (H) 10/26/2022 0851   TRIG 214 (H) 10/26/2022 0851   HDL 41 10/26/2022 0851   CHOLHDL 6.1 (H) 10/26/2022 0851   CHOLHDL 3.4 03/10/2018 0954   VLDL 38 (H) 08/19/2016 1010   LDLCALC 168 (H) 10/26/2022 0851   LDLCALC 91 03/10/2018 0954   LABVLDL 40 10/26/2022 0851   Coronary calcium score of 573. This was 11 th percentile for age and sex matched control.  Past Medical History:  Diagnosis Date   Anticipatory grief    Arthritis    "hands" (09/17/2013)   Celiac disease    CKD (chronic kidney disease), stage III (HCC)    Collagenous colitis    Dyspnea on exertion    Estrogen deficiency    GERD (gastroesophageal reflux disease)    Hand arthritis    Hearing loss    Hyperlipidemia    Hypertension    IBS (irritable bowel syndrome)    Low vitamin B12 level    Multiple food allergies    Nocturnal leg cramps    Osteopenia     Sleep disturbance    Unspecified vitamin D deficiency    Varicose veins of bilateral lower extremities with pain     Current Outpatient Medications on File Prior to Visit  Medication Sig Dispense Refill   chlorthalidone (HYGROTON) 25 MG tablet Take 1 tablet (25 mg total) by mouth daily. 30 tablet 1   Cholecalciferol (VITAMIN D3) 1000 units CAPS Take 1 tablet by mouth 2 (two) times daily.     VITAMIN A PO Take 2,400 mcg by mouth daily at 12 noon.     Zinc 50 MG TABS Take 1 tablet by mouth daily.     Calcium & Magnesium Carbonates (MYLANTA PO) Take 1 tablet by mouth as needed.     Coenzyme Q10 (CO Q-10) 100 MG CAPS Take by mouth. (Patient not taking: Reported on 11/25/2022)     Cyanocobalamin (VITAMIN B-12 PO) Take 2,500 mg by mouth 2 (two) times daily.     fluticasone (FLONASE) 50 MCG/ACT nasal spray Place 2 sprays into both nostrils daily. 16 g 0   MAGNESIUM CITRATE PO Take 250 mg by mouth daily at 12 noon. (Patient not taking: Reported on 11/25/2022)     multivitamin-lutein (OCUVITE-LUTEIN) CAPS Take 1 capsule by mouth daily.     omeprazole (PRILOSEC) 20 MG capsule Take 20 mg by mouth daily.     OVER THE COUNTER MEDICATION Patient rotates OTC Nexium ,Tagamet and Prevacid for indigestion.     Probiotic Product (PROBIOTIC DAILY PO) Take by mouth.     Pyridoxine HCl (VITAMIN B6 PO) Take 100 mg by mouth daily at 12 noon. (Patient not taking: Reported on 11/25/2022)     No current facility-administered medications on file prior to visit.    Allergies  Allergen Reactions   Codeine Other (See Comments)    hallucinations   Lactose Intolerance (Gi) Diarrhea   Milk (Cow)     Other reaction(s): Not available   Shrimp Extract     Other reaction(s): Not available   Tomato Diarrhea   Wheat Diarrhea   Morphine And Codeine Other (See Comments)    Unknown-family history of allergy   Tape Itching and Rash    Assessment/Plan:  1. Hyperlipidemia -  Mixed hyperlipidemia Assessment: LDL-C is  above goal of less than 70 per ACC and less than 55 per endocrinology guidelines Patient intolerant  to rosuvastatin 20 mg daily and atorvastatin 10 mg every other day She remains very active Leg pain experienced significantly decreased her quality of life She does not eat any dairy except for a small amount of lactose free ice cream every 1 to 2 weeks.  She has been limiting her red meat and fried foods We discussed PCSK9 inhibitors.  Discussed efficacy and cost.  We also discussed trying low-dose rosuvastatin 3 times a week.  Will go with a less expensive option first.  If this is unsuccessful we will then consider PCSK9 but will need to get patient assistance through the [employer] grant  Plan: Start rosuvastatin 5 mg every other day.  Patient advised to wait a few days until her legs feel better before starting Stop atorvastatin Repeat labs 02/17/2023 Patient is to call if she does not tolerate rosuvastatin    Thank you,  Olene Floss, Pharm.D, BCACP, BCPS, CPP [facility] [address] [place], [place] [address]  Phone: [phone]; Fax: [phone]

## 2022-11-25 NOTE — Assessment & Plan Note (Signed)
Assessment: LDL-C is above goal of less than 70 per ACC and less than 55 per endocrinology guidelines Patient intolerant to rosuvastatin 20 mg daily and atorvastatin 10 mg every other day She remains very active Leg pain experienced significantly decreased her quality of life She does not eat any dairy except for a small amount of lactose free ice cream every 1 to 2 weeks.  She has been limiting her red meat and fried foods We discussed PCSK9 inhibitors.  Discussed efficacy and cost.  We also discussed trying low-dose rosuvastatin 3 times a week.  Will go with a less expensive option first.  If this is unsuccessful we will then consider PCSK9 but will need to get patient assistance through the [employer] grant  Plan: Start rosuvastatin 5 mg every other day.  Patient advised to wait a few days until her legs feel better before starting Stop atorvastatin Repeat labs 02/17/2023 Patient is to call if she does not tolerate rosuvastatin

## 2022-11-25 NOTE — Patient Instructions (Addendum)
STOP atorvastatin  START rosuvastatin 5mg  three times a week  Fasting labs on Thursday 9/12. Please let me know if you need to change the appointment  Please call me at [phone] with any issues  Please resume walking and strength exercises

## 2022-11-30 ENCOUNTER — Ambulatory Visit (HOSPITAL_BASED_OUTPATIENT_CLINIC_OR_DEPARTMENT_OTHER): Payer: Medicare HMO | Admitting: Pulmonary Disease

## 2022-11-30 ENCOUNTER — Encounter (HOSPITAL_BASED_OUTPATIENT_CLINIC_OR_DEPARTMENT_OTHER): Payer: Self-pay | Admitting: Pulmonary Disease

## 2022-11-30 ENCOUNTER — Ambulatory Visit (HOSPITAL_BASED_OUTPATIENT_CLINIC_OR_DEPARTMENT_OTHER): Payer: Medicare HMO

## 2022-11-30 VITALS — BP 124/68 | HR 87 | Temp 98.1°F | Ht 61.0 in | Wt 126.4 lb

## 2022-11-30 DIAGNOSIS — R0609 Other forms of dyspnea: Secondary | ICD-10-CM

## 2022-11-30 DIAGNOSIS — J439 Emphysema, unspecified: Secondary | ICD-10-CM | POA: Diagnosis not present

## 2022-11-30 DIAGNOSIS — R918 Other nonspecific abnormal finding of lung field: Secondary | ICD-10-CM | POA: Diagnosis not present

## 2022-11-30 MED ORDER — SPIRIVA RESPIMAT 2.5 MCG/ACT IN AERS: 2.0000 | INHALATION_SPRAY | Freq: Every day | RESPIRATORY_TRACT | 5 refills | Status: DC

## 2022-11-30 NOTE — Patient Instructions (Signed)
Spiriva two puffs daily  Chest xray today  Will schedule a pulmonary function test  Follow up in 4 weeks

## 2022-11-30 NOTE — Progress Notes (Signed)
[facility]  Chief Complaint  Patient presents with   Consult    Consult for Dyspnea on exertion    Past Surgical History:  She  has a past surgical history that includes Bladder suspension (05/2009); Cholecystectomy (2000's); Vaginal hysterectomy (~ 1977); Cataract extraction w/ intraocular lens  implant, bilateral (Bilateral, 2000's); and Anterior and posterior repair (05/2009).  Past Medical History:  Arthritis, Celiac disease, CKD 3, Collagenous colitis, GERD, HLD, HTN, IBS, Osteopenia, Vit D deficiency, Vit B12 deficiency, Varicose veins, non obstructive CAD  Constitutional:  BP 124/68 (BP Location: Right Arm, Patient Position: Sitting, Cuff Size: Normal)   Pulse 87   Temp 98.1 F (36.7 C) (Oral)   Ht 5\' 1"  (1.549 m)   Wt 126 lb 6.4 oz (57.3 kg)   SpO2 96%   BMI 23.88 kg/m   Brief Summary:  Ashley Mayer is a 82 y.o. female with dyspnea.      Subjective:   Her ex-husband used to smoke.  She was divorced about 40 years ago.  She had pneumonia as a child.  Several of her relatives have respiratory problems and use inhalers.  She has noticed more trouble with her breathing with exertion.  She will sometimes get a dry cough.  Her breathing recovers after resting a few minutes.  She denies chest pain, wheeze, sputum, hemoptysis, or leg swelling.  She does get cramps in her legs if she does too much, or sometimes at night.  No history of thromboembolic disease.  No history of COVID.  She denies history of asthma.  Physical Exam:   Appearance - well kempt   ENMT - no sinus tenderness, no oral exudate, no LAN, Mallampati 2 airway, no stridor, wears dentures  Respiratory - equal breath sounds bilaterally, no wheezing or rales  CV - s1s2 regular rate and rhythm, no murmurs  Ext - no clubbing, no edema  Skin - no rashes  Psych - normal mood and affect   Pulmonary testing:    Chest Imaging:    Cardiac Tests:   Cardiac CT 03/16/22 >> mild emphysema, small HH, coronary calcium score 573 Echo 03/22/22 >> EF 50 to 55%, mild/mod MR  Social History:  She  reports that she has never smoked. She has never used smokeless tobacco. She reports that she does not drink alcohol and does not use drugs.  Family History:  Her family history includes Asthma in an other family member; Cancer in her brother, father, and another family member; Diabetes in her sister; Heart attack in an other family member; Heart disease in her brother; Hemochromatosis in her son; Hyperlipidemia in her brother, mother, and another family member; Hypertension in her brother and another family member; Stroke in her brother, mother, and another family member.    Discussion:  Her dyspnea on exertion is likely multifactorial.  She has coronary artery disease and mitral regurgitation.  She has second had tobacco exposure and changes of emphysema on her cardiac CT.  She likely has a component of deconditioning.    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 - will have her try spiriva respimat 2.5 mcg two puffs daily - will arrange for a pulmonary function test and chest xray  Coronary artery disease, mitral regurgitation. - follow up in cardiology  Deconditioning. - discussed options to improve her exercise capacity by combing cardiovascular and resistance training   Time Spent Involved in Patient Care on Day of Examination:  Follow up:   Patient Instructions  Spiriva two puffs daily  Chest xray today  Will schedule a pulmonary function test  Follow up in 4 weeks  Medication List:   Allergies as of 11/30/2022       Reactions   Codeine Other (See Comments)   hallucinations   Lactose Intolerance (gi) Diarrhea   Milk (cow)    Other reaction(s): Not available   Shrimp Extract    Other reaction(s): Not available   Tomato Diarrhea   Wheat Diarrhea   Morphine And Codeine Other (See  Comments)   Unknown-family history of allergy   Tape Itching, Rash        Medication List        Accurate as of November 30, 2022 10:11 AM. If you have any questions, ask your nurse or doctor.          chlorthalidone 25 MG tablet Commonly known as: HYGROTON Take 1 tablet (25 mg total) by mouth daily.   Co Q-10 100 MG Caps Take by mouth.   fluticasone 50 MCG/ACT nasal spray Commonly known as: FLONASE Place 2 sprays into both nostrils daily.   MAGNESIUM CITRATE PO Take 250 mg by mouth daily at 12 noon.   multivitamin-lutein Caps capsule Take 1 capsule by mouth daily.   MYLANTA PO Take 1 tablet by mouth as needed.   omeprazole 20 MG capsule Commonly known as: PRILOSEC Take 20 mg by mouth daily.   OVER THE COUNTER MEDICATION Patient rotates OTC Nexium ,Tagamet and Prevacid for indigestion.   PROBIOTIC DAILY PO Take by mouth.   rosuvastatin 5 MG tablet Commonly known as: CRESTOR Take 1 tablet (5 mg total) by mouth 3 (three) times a week.   Spiriva Respimat 2.5 MCG/ACT Aers Generic drug: Tiotropium Bromide Monohydrate Inhale 2 puffs into the lungs daily. Started by: Coralyn Helling, MD   VITAMIN A PO Take 2,400 mcg by mouth daily at 12 noon.   VITAMIN B-12 PO Take 2,500 mg by mouth 2 (two) times daily.   VITAMIN B6 PO Take 100 mg by mouth daily at 12 noon.   Vitamin D3 1000 units Caps Take 1 tablet by mouth 2 (two) times daily.   Zinc 50 MG Tabs Take 1 tablet by mouth daily.        Signature:  Coralyn Helling, MD [facility] Pulmonary/Critical Care Pager - [phone] 11/30/2022, 10:11 AM

## 2022-12-01 ENCOUNTER — Telehealth: Payer: Self-pay | Admitting: Pulmonary Disease

## 2022-12-01 NOTE — Telephone Encounter (Signed)
Last AVS notes state; will have her try spiriva respimat 2.5 mcg two puffs daily   This was over 800.00 so she wonders if Dr. Could RX a alternative. Will adv PT our PA team will look at it and to give them time.   Pharm is [facility] on [address] # for her is [phone]   Triage- Please call PT to advise if she still needs PFT test sched on 7/3

## 2022-12-02 ENCOUNTER — Other Ambulatory Visit (HOSPITAL_COMMUNITY): Payer: Self-pay

## 2022-12-02 NOTE — Telephone Encounter (Signed)
Spiriva is not covered at this time under the patients plan, preferred med is Incruse and it is showing a co-pay of $47.00

## 2022-12-06 DIAGNOSIS — D649 Anemia, unspecified: Secondary | ICD-10-CM | POA: Diagnosis not present

## 2022-12-07 NOTE — Telephone Encounter (Signed)
Okay to change to incruse 1 puff daily. 

## 2022-12-07 NOTE — Telephone Encounter (Signed)
Called and spoke with patient. She is ok with trying Incruse. I went over the instructions and directions with her and her verbalized understanding. She is aware that I will send a message to Dr. Craige Cotta to see if he is ok with her being on Incruse.   Dr. Craige Cotta, please advise if you are ok with her being on Incruse instead of Spiriva. Thanks!

## 2022-12-08 ENCOUNTER — Encounter (HOSPITAL_BASED_OUTPATIENT_CLINIC_OR_DEPARTMENT_OTHER): Payer: Medicare HMO

## 2022-12-08 ENCOUNTER — Other Ambulatory Visit (HOSPITAL_BASED_OUTPATIENT_CLINIC_OR_DEPARTMENT_OTHER): Payer: Self-pay

## 2022-12-08 ENCOUNTER — Ambulatory Visit (INDEPENDENT_AMBULATORY_CARE_PROVIDER_SITE_OTHER): Payer: Medicare HMO | Admitting: Pulmonary Disease

## 2022-12-08 DIAGNOSIS — R0609 Other forms of dyspnea: Secondary | ICD-10-CM | POA: Diagnosis not present

## 2022-12-08 DIAGNOSIS — J439 Emphysema, unspecified: Secondary | ICD-10-CM

## 2022-12-08 LAB — PULMONARY FUNCTION TEST: DL/VA % pred: 77 %

## 2022-12-08 MED ORDER — INCRUSE ELLIPTA 62.5 MCG/ACT IN AEPB: 1.0000 | INHALATION_SPRAY | Freq: Every day | RESPIRATORY_TRACT | 0 refills | Status: DC

## 2022-12-08 NOTE — Telephone Encounter (Signed)
Incruse has already been sent to [facility] for pt.

## 2022-12-08 NOTE — Patient Instructions (Signed)
Full PFT performed today. °

## 2022-12-08 NOTE — Progress Notes (Signed)
Full PFT performed today. °

## 2022-12-13 LAB — PULMONARY FUNCTION TEST
DL/VA % pred: 77 %
DL/VA: 3.21 ml/min/mmHg/L
DLCO unc % pred: 67 %
DLCO unc: 11.48 ml/min/mmHg
FEF 25-75 Post: 1.85 L/sec
FEF 25-75 Pre: 1.51 L/sec
FEF2575-%Change-Post: 22 %
FEF2575-%Pred-Post: 158 %
FEF2575-%Pred-Pre: 129 %
FEV1-%Change-Post: 3 %
FEV1-%Pred-Post: 104 %
FEV1-%Pred-Pre: 100 %
FEV1-Post: 1.71 L
FEV1-Pre: 1.65 L
FEV1FVC-%Change-Post: 5 %
FEV1FVC-%Pred-Pre: 108 %
FEV6-%Change-Post: -1 %
FEV6-%Pred-Post: 96 %
FEV6-%Pred-Pre: 97 %
FEV6-Post: 2.01 L
FEV6-Pre: 2.04 L
FEV6FVC-%Change-Post: 1 %
FEV6FVC-%Pred-Post: 106 %
FEV6FVC-%Pred-Pre: 104 %
FVC-%Change-Post: -1 %
FVC-%Pred-Post: 91 %
FVC-%Pred-Pre: 93 %
FVC-Post: 2.04 L
FVC-Pre: 2.07 L
Post FEV1/FVC ratio: 84 %
Post FEV6/FVC ratio: 100 %
Pre FEV1/FVC ratio: 79 %
Pre FEV6/FVC Ratio: 98 %
RV % pred: 75 %
RV: 1.74 L
TLC % pred: 85 %
TLC: 3.98 L

## 2022-12-30 ENCOUNTER — Encounter (HOSPITAL_BASED_OUTPATIENT_CLINIC_OR_DEPARTMENT_OTHER): Payer: Self-pay | Admitting: Pulmonary Disease

## 2022-12-30 ENCOUNTER — Ambulatory Visit (HOSPITAL_BASED_OUTPATIENT_CLINIC_OR_DEPARTMENT_OTHER): Payer: Medicare HMO | Admitting: Pulmonary Disease

## 2022-12-30 VITALS — BP 112/62 | HR 77 | Resp 12 | Ht 61.0 in | Wt 130.6 lb

## 2022-12-30 DIAGNOSIS — J9811 Atelectasis: Secondary | ICD-10-CM | POA: Diagnosis not present

## 2022-12-30 DIAGNOSIS — J439 Emphysema, unspecified: Secondary | ICD-10-CM

## 2022-12-30 MED ORDER — ALBUTEROL SULFATE HFA 108 (90 BASE) MCG/ACT IN AERS: 2.0000 | INHALATION_SPRAY | Freq: Four times a day (QID) | RESPIRATORY_TRACT | 5 refills | Status: AC | PRN

## 2022-12-30 NOTE — Progress Notes (Signed)
[facility]  Chief Complaint  Patient presents with   Follow-up    4 follow up , pt had PFT  done, she hasn't notice any different since last visit , using it okay with any issues     Past Surgical History:  She  has a past surgical history that includes Bladder suspension (05/2009); Cholecystectomy (2000's); Vaginal hysterectomy (~ 1977); Cataract extraction w/ intraocular lens  implant, bilateral (Bilateral, 2000's); and Anterior and posterior repair (05/2009).  Past Medical History:  Arthritis, Celiac disease, CKD 3, Collagenous colitis, GERD, HLD, HTN, IBS, Osteopenia, Vit D deficiency, Vit B12 deficiency, Varicose veins, non obstructive CAD  Constitutional:  BP 112/62   Pulse 77   Resp 12   Ht 5\' 1"  (1.549 m)   Wt 130 lb 9.6 oz (59.2 kg)   SpO2 99%   BMI 24.68 kg/m   Brief Summary:  Ashley Mayer is a 82 y.o. female with pulmonary emphysema.      Subjective:   CXR from 12/04/22 showed Lt infrahilar atelectasis and lucency possibly from bronchiectasis.  PFT showed mild diffusion defect, but otherwise normal.  She tried incruse.  Spiriva wasn't covered by insurance.  Inhaler might help a little.  She notices more trouble in hot weather.  In cooler weather, she doesn't have trouble with her breathing.  Not having cough, wheeze, or sputum.  Her sister fell again and broke her hip.  She will be going to [place] to help them move.  Physical Exam:   Appearance - well kempt   ENMT - no sinus tenderness, no oral exudate, no LAN, Mallampati 2 airway, no stridor, wears dentures  Respiratory - equal breath sounds bilaterally, no wheezing or rales  CV - s1s2 regular rate and rhythm, 2/6 SM  Ext - no clubbing, no edema  Skin - no rashes  Psych - normal mood and affect    Pulmonary testing:  PFT 12/08/22 >> FEV1 1.71 (104%), FEV1% 84, TLC 3.98 (85%), DLCO 67%  Chest Imaging:    Cardiac Tests:  Cardiac CT 03/16/22 >>  mild emphysema, small HH, coronary calcium score 573 Echo 03/22/22 >> EF 50 to 55%, mild/mod MR  Social History:  She  reports that she has never smoked. She has never used smokeless tobacco. She reports that she does not drink alcohol and does not use drugs.  Family History:  Her family history includes Asthma in an other family member; Cancer in her brother, father, and another family member; Diabetes in her sister; Heart attack in an other family member; Heart disease in her brother; Hemochromatosis in her son; Hyperlipidemia in her brother, mother, and another family member; Hypertension in her brother and another family member; Stroke in her brother, mother, and another family member.     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 - no significant symptoms - only mild diffusion defect on PFT - spiriva not covered by insurance - didn't notice much difference with incruse - will change to prn albuterol  Atelectasis with possible bronchiectasis reported on chest xray. - repeat chest xray today >> if abnormalities persist, then will need CT chest  Coronary artery disease, mitral regurgitation. - follow up with cardiology  Deconditioning. - encouraged her to maintain a regular exercise regimen  Time Spent Involved in Patient Care on Day of Examination:  26 minutes  Follow up:   Patient Instructions  Chest xray today  Albuterol two puffs every 6 hours as  needed for cough, wheeze, chest congestion, or shortness of breath  Follow up in 4 months  Medication List:   Allergies as of 12/30/2022       Reactions   Codeine Other (See Comments)   hallucinations   Lactose Intolerance (gi) Diarrhea   Milk (cow)    Other reaction(s): Not available   Shrimp Extract    Other reaction(s): Not available   Tomato Diarrhea   Wheat Diarrhea   Morphine And Codeine Other (See Comments)   Unknown-family history of allergy   Tape Itching, Rash         Medication List        Accurate as of December 30, 2022 10:11 AM. If you have any questions, ask your nurse or doctor.          STOP taking these medications    Incruse Ellipta 62.5 MCG/ACT Aepb Generic drug: umeclidinium bromide Stopped by: Coralyn Helling   Spiriva Respimat 2.5 MCG/ACT Aers Generic drug: Tiotropium Bromide Monohydrate Stopped by: Coralyn Helling       TAKE these medications    albuterol 108 (90 Base) MCG/ACT inhaler Commonly known as: Ventolin HFA Inhale 2 puffs into the lungs every 6 (six) hours as needed for wheezing or shortness of breath. Started by: Coralyn Helling   chlorthalidone 25 MG tablet Commonly known as: HYGROTON Take 1 tablet (25 mg total) by mouth daily.   Co Q-10 100 MG Caps Take by mouth.   fluticasone 50 MCG/ACT nasal spray Commonly known as: FLONASE Place 2 sprays into both nostrils daily.   MAGNESIUM CITRATE PO Take 250 mg by mouth daily at 12 noon.   multivitamin-lutein Caps capsule Take 1 capsule by mouth daily.   MYLANTA PO Take 1 tablet by mouth as needed.   omeprazole 20 MG capsule Commonly known as: PRILOSEC Take 20 mg by mouth daily.   OVER THE COUNTER MEDICATION Patient rotates OTC Nexium ,Tagamet and Prevacid for indigestion.   PROBIOTIC DAILY PO Take by mouth.   rosuvastatin 5 MG tablet Commonly known as: CRESTOR Take 1 tablet (5 mg total) by mouth 3 (three) times a week.   VITAMIN A PO Take 2,400 mcg by mouth daily at 12 noon.   VITAMIN B-12 PO Take 2,500 mg by mouth 2 (two) times daily.   VITAMIN B6 PO Take 100 mg by mouth daily at 12 noon.   Vitamin D3 1000 units Caps Take 1 tablet by mouth 2 (two) times daily.   Zinc 50 MG Tabs Take 1 tablet by mouth daily.        Signature:  Coralyn Helling, MD [facility] Pulmonary/Critical Care Pager - [phone] 12/30/2022, 10:11 AM

## 2022-12-30 NOTE — Patient Instructions (Signed)
Chest xray today  Albuterol two puffs every 6 hours as needed for cough, wheeze, chest congestion, or shortness of breath  Follow up in 4 months

## 2023-01-18 DIAGNOSIS — J329 Chronic sinusitis, unspecified: Secondary | ICD-10-CM | POA: Diagnosis not present

## 2023-01-24 ENCOUNTER — Telehealth: Payer: Self-pay | Admitting: Pulmonary Disease

## 2023-01-24 NOTE — Telephone Encounter (Signed)
Patient is calling wanting to be put back on her inhaler. She recently had bronchitis and thinks it will help. Please advise. She cn be reached at [phone].

## 2023-01-24 NOTE — Telephone Encounter (Signed)
Routing to [facility]. Please advise for this patient. LOV with [place] was 12/30/22  [facility] does not reach out when scripts have been filled for patient. Please call patient back.

## 2023-01-24 NOTE — Telephone Encounter (Signed)
Called and spoke w/ pt she said that she would like to restart Incruse 62.5mg , when patient was seen in 07/24 pt stated that at ov that this didn't help much , now she want to restart this medication. Is this okay to refill

## 2023-01-25 NOTE — Telephone Encounter (Signed)
Pt called back stating she does need a refill for Incruse

## 2023-01-27 MED ORDER — INCRUSE ELLIPTA 62.5 MCG/ACT IN AEPB: 1.0000 | INHALATION_SPRAY | Freq: Every day | RESPIRATORY_TRACT | 5 refills | Status: DC

## 2023-01-27 NOTE — Telephone Encounter (Signed)
Please let her know I have sent a refill for incruse.

## 2023-01-27 NOTE — Telephone Encounter (Signed)
Patient informed incruse was sent to [facility].

## 2023-02-17 ENCOUNTER — Ambulatory Visit: Payer: Medicare HMO | Attending: Nurse Practitioner

## 2023-02-17 DIAGNOSIS — E782 Mixed hyperlipidemia: Secondary | ICD-10-CM | POA: Diagnosis not present

## 2023-02-18 ENCOUNTER — Telehealth: Payer: Self-pay | Admitting: Pharmacist

## 2023-02-18 DIAGNOSIS — E782 Mixed hyperlipidemia: Secondary | ICD-10-CM

## 2023-02-18 LAB — LIPID PANEL
Chol/HDL Ratio: 6.7 ratio — ABNORMAL HIGH (ref 0.0–4.4)
Cholesterol, Total: 255 mg/dL — ABNORMAL HIGH (ref 100–199)
HDL: 38 mg/dL — ABNORMAL LOW (ref >39)
LDL Chol Calc (NIH): 170 mg/dL — ABNORMAL HIGH (ref 0–99)
Triglycerides: 249 mg/dL — ABNORMAL HIGH (ref 0–149)
VLDL Cholesterol Cal: 47 mg/dL — ABNORMAL HIGH (ref 5–40)

## 2023-02-18 LAB — HEPATIC FUNCTION PANEL
ALT: 14 IU/L (ref 0–32)
AST: 20 IU/L (ref 0–40)
Albumin: 4.4 g/dL (ref 3.7–4.7)
Alkaline Phosphatase: 76 IU/L (ref 44–121)
Bilirubin Total: 0.5 mg/dL (ref 0.0–1.2)
Bilirubin, Direct: 0.11 mg/dL (ref 0.00–0.40)
Total Protein: 6.7 g/dL (ref 6.0–8.5)

## 2023-02-18 NOTE — Telephone Encounter (Signed)
Called and spoke to patient about her latest lipid labs.  LDL cholesterol is higher than it was in May.  I first patient states that she was taking rosuvastatin but then admits that she has been working a lot and she may have missed it.  Also states that her diet has been poor.  She is going to try to do a better job with her diet and make sure that she is taking the rosuvastatin 3 times a week.  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  Recheck labs October 24.

## 2023-03-10 ENCOUNTER — Telehealth (HOSPITAL_BASED_OUTPATIENT_CLINIC_OR_DEPARTMENT_OTHER): Payer: Self-pay | Admitting: Pulmonary Disease

## 2023-03-14 NOTE — Telephone Encounter (Signed)
Waiting on rx team response

## 2023-03-15 ENCOUNTER — Other Ambulatory Visit (HOSPITAL_COMMUNITY): Payer: Self-pay

## 2023-03-15 NOTE — Telephone Encounter (Signed)
Incruse is the covered [facility] at this time. Patient may want to discuss price with insurance.

## 2023-03-22 NOTE — Telephone Encounter (Signed)
Lmtcb X1

## 2023-03-22 NOTE — Telephone Encounter (Signed)
Please let the patient know what pharmacy technician is relaying

## 2023-03-22 NOTE — Telephone Encounter (Signed)
Patient returning missed call. 

## 2023-03-23 NOTE — Telephone Encounter (Signed)
Detailed message left on identifiable machine belonging to the patient. Advised to call with any questions or concerns.

## 2023-03-31 ENCOUNTER — Ambulatory Visit: Payer: Medicare HMO | Attending: Nurse Practitioner

## 2023-03-31 DIAGNOSIS — E782 Mixed hyperlipidemia: Secondary | ICD-10-CM | POA: Diagnosis not present

## 2023-03-31 LAB — LIPID PANEL
Chol/HDL Ratio: 4 ratio (ref 0.0–4.4)
Cholesterol, Total: 172 mg/dL (ref 100–199)
HDL: 43 mg/dL (ref >39)
LDL Chol Calc (NIH): 98 mg/dL (ref 0–99)
Triglycerides: 181 mg/dL — ABNORMAL HIGH (ref 0–149)
VLDL Cholesterol Cal: 31 mg/dL (ref 5–40)

## 2023-03-31 LAB — HEPATIC FUNCTION PANEL
ALT: 15 [IU]/L (ref 0–32)
AST: 18 [IU]/L (ref 0–40)
Albumin: 4.3 g/dL (ref 3.7–4.7)
Alkaline Phosphatase: 67 [IU]/L (ref 44–121)
Bilirubin Total: 0.4 mg/dL (ref 0.0–1.2)
Bilirubin, Direct: 0.12 mg/dL (ref 0.00–0.40)
Total Protein: 6.7 g/dL (ref 6.0–8.5)

## 2023-04-04 ENCOUNTER — Telehealth: Payer: Self-pay | Admitting: Pharmacist

## 2023-04-04 DIAGNOSIS — E782 Mixed hyperlipidemia: Secondary | ICD-10-CM

## 2023-04-04 NOTE — Telephone Encounter (Signed)
LDL-C much improved, but still above goal. Currently on rosuvastatin 5mg  three times a week. Will discuss increasing frequency with patient vs adding Zetia. LFT WNL.   I called pt and left a detailed message and requested that the patient call back.

## 2023-04-04 NOTE — Telephone Encounter (Signed)
  Pt is returning call. She said, she will be out doing errands and to call her back around 3 pm

## 2023-04-05 MED ORDER — ROSUVASTATIN CALCIUM 5 MG PO TABS: 5.0000 mg | ORAL_TABLET | Freq: Every day | ORAL | 3 refills | Status: DC

## 2023-04-05 NOTE — Telephone Encounter (Signed)
Reviewed results with patient.  She states for the last for the last 2-3 weeks has been taking rosuvastatin 5mg  daily.  He has had significant improvement in her LDL cholesterol but it is above goal.  Offered to add ezetimibe or increased rosuvastatin dose.  Patient did not want to take any more medication but said that if it was still high in 3 months she would consider.  Reports that she changed to fat-free milk and is only using it on her cereal.  She does not have a primary cardiologist, asking about a female cardiologist.  Recommended either Dr. Duke Salvia or Dr. Cristal Deer.  Patient would like to make an appoint with Dr. Cristal Deer.  I gave her the number to call to schedule.  Will place orders for labs for 3 months.

## 2023-04-19 DIAGNOSIS — R21 Rash and other nonspecific skin eruption: Secondary | ICD-10-CM | POA: Diagnosis not present

## 2023-04-19 DIAGNOSIS — Z6824 Body mass index (BMI) 24.0-24.9, adult: Secondary | ICD-10-CM | POA: Diagnosis not present

## 2023-05-19 DIAGNOSIS — E559 Vitamin D deficiency, unspecified: Secondary | ICD-10-CM | POA: Diagnosis not present

## 2023-05-19 DIAGNOSIS — J439 Emphysema, unspecified: Secondary | ICD-10-CM | POA: Diagnosis not present

## 2023-05-19 DIAGNOSIS — E7800 Pure hypercholesterolemia, unspecified: Secondary | ICD-10-CM | POA: Diagnosis not present

## 2023-05-19 DIAGNOSIS — I700 Atherosclerosis of aorta: Secondary | ICD-10-CM | POA: Diagnosis not present

## 2023-05-19 DIAGNOSIS — I10 Essential (primary) hypertension: Secondary | ICD-10-CM | POA: Diagnosis not present

## 2023-05-19 DIAGNOSIS — D649 Anemia, unspecified: Secondary | ICD-10-CM | POA: Diagnosis not present

## 2023-05-19 DIAGNOSIS — N1831 Chronic kidney disease, stage 3a: Secondary | ICD-10-CM | POA: Diagnosis not present

## 2023-07-08 ENCOUNTER — Encounter (HOSPITAL_BASED_OUTPATIENT_CLINIC_OR_DEPARTMENT_OTHER): Payer: Self-pay | Admitting: Cardiology

## 2023-07-08 ENCOUNTER — Ambulatory Visit (INDEPENDENT_AMBULATORY_CARE_PROVIDER_SITE_OTHER): Payer: Medicare HMO | Admitting: Cardiology

## 2023-07-08 VITALS — BP 134/72 | HR 75 | Ht 61.0 in | Wt 129.1 lb

## 2023-07-08 DIAGNOSIS — R011 Cardiac murmur, unspecified: Secondary | ICD-10-CM | POA: Diagnosis not present

## 2023-07-08 DIAGNOSIS — I340 Nonrheumatic mitral (valve) insufficiency: Secondary | ICD-10-CM

## 2023-07-08 DIAGNOSIS — I10 Essential (primary) hypertension: Secondary | ICD-10-CM

## 2023-07-08 DIAGNOSIS — E782 Mixed hyperlipidemia: Secondary | ICD-10-CM

## 2023-07-08 DIAGNOSIS — I2510 Atherosclerotic heart disease of native coronary artery without angina pectoris: Secondary | ICD-10-CM | POA: Diagnosis not present

## 2023-07-08 MED ORDER — ROSUVASTATIN CALCIUM 10 MG PO TABS: 10.0000 mg | ORAL_TABLET | Freq: Every day | ORAL | 3 refills | Status: DC

## 2023-07-08 NOTE — Patient Instructions (Addendum)
Check and make sure you are not taking chlorthalidone (for blood pressure). If you are taking, keep taking it. If you are not, check blood pressure periodically at home. If it is running >130/80 frequently, call and let us know.  *If you need a refill on your cardiac medications before your next appointment, please call your pharmacy*   Lab Work: Lipid panel will be drawn today. If you have labs (blood work) drawn today and your tests are completely normal, you will receive your results only by: MyChart Message (if you have MyChart) OR A paper copy in the mail If you have any lab test that is abnormal or we need to change your treatment, we will call you to review the results.   Testing/Procedures: No procedures ordered today.    Follow-Up: At [facility], you and your health needs are our priority.  As part of our continuing mission to provide you with exceptional heart care, we have created designated Provider Care Teams.  These Care Teams include your primary Cardiologist (physician) and Advanced Practice Providers (APPs -  Physician Assistants and Nurse Practitioners) who all work together to provide you with the care you need, when you need it.  We recommend signing up for the patient portal called "MyChart".  Sign up information is provided on this After Visit Summary.  MyChart is used to connect with patients for Virtual Visits (Telemedicine).  Patients are able to view lab/test results, encounter notes, upcoming appointments, etc.  Non-urgent messages can be sent to your provider as well.   To learn more about what you can do with MyChart, go to [email or web address].    Your next appointment:   1 year(s)  Provider:   Jodelle Red, MD    Other Instructions Thank you for choosing [facility]!

## 2023-07-08 NOTE — Progress Notes (Signed)
Cardiology Office Note:  .   Date:  07/08/2023  ID:  Gilford Rile Keithley, DOB 06-Aug-1940, MRN [id number] PCP: Aliene Beams, MD  [facility] Providers Cardiologist:  Jodelle Red, MD {  History of Present Illness: .   Ashley Mayer is a 83 y.o. female with PMH hypertension, hyperlipidemia, celiac disease, emphysema/bronchiectasis. She was previously seen by Dr. Elease Hashimoto and Dr. Katrinka Blazing, established care with me on 07/08/23.  Pertinent CV history: seen for chest pain/DOE remotely. Echo 2023 with EF 50-55%, RV normal, mild-moderate MR, aortic sclerosis without stenosis.  Ca score 2023 was 573 (70th %ile).   Today: Told she had a murmur by Dr. Craige Cotta in July, noted 2/6 systolic murmur.  Sometimes gets back and shoulder pain. Rarely gets left upper chest soreness.  Has been following with our Pharmd team re: her lipids.   Has baseline shortness of breath, follows with pulmonology for emphysema/bronchiectasis. Recommended to work on exercise conditioning at her last visit.  Has family history of heart and valve issues, discussed today.  Still works one day/week, walks around all day, no limitations  Only taking two medications right now. Taking pantoprazole 40 mg daily and rosuvastatin 10 mg daily. Does not recall if she is still taking chlortalidone but she does not think she is. Now taking beet root. Hasn't checked BP at home very recently but when she was, it was consistently in the normal range.  ROS: No PND, orthopnea, LE edema or unexpected weight gain. No syncope or palpitations. ROS otherwise negative except as noted.   Studies Reviewed: Marland Kitchen    EKG:  EKG Interpretation Date/Time:  Friday July 08 2023 10:09:15 EST Ventricular Rate:  66 PR Interval:    QRS Duration:  82 QT Interval:  422 QTC Calculation: 442 R Axis:   54  Text Interpretation: Sinus rhythm vs Accelerated Junctional rhythm , baseline artifact in lead 2 Low voltage QRS Septal infarct , age  undetermined Confirmed by Jodelle Red [phone]) on 07/08/2023 2:16:39 PM    Physical Exam:   VS:  BP 134/72   Pulse 75   Ht 5\' 1"  (1.549 m)   Wt 129 lb 1.6 oz (58.6 kg)   SpO2 98%   BMI 24.39 kg/m    Wt Readings from Last 3 Encounters:  07/08/23 129 lb 1.6 oz (58.6 kg)  12/30/22 130 lb 9.6 oz (59.2 kg)  11/30/22 126 lb 6.4 oz (57.3 kg)    GEN: Well nourished, well developed in no acute distress HEENT: Normal, moist mucous membranes NECK: No JVD CARDIAC: regular rhythm, normal S1 and S2, no rubs or gallops. 1/6 systolic murmur. VASCULAR: Radial and DP pulses 2+ bilaterally. No carotid bruits RESPIRATORY:  Clear to auscultation without rales, wheezing or rhonchi  ABDOMEN: Soft, non-tender, non-distended MUSCULOSKELETAL:  Ambulates independently SKIN: Warm and dry, no edema NEUROLOGIC:  Alert and oriented x 3. No focal neuro deficits noted. PSYCHIATRIC:  Normal affect    ASSESSMENT AND PLAN: .    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 Recommended to increase statin dose as LDL was 98 vs. Adding ezetimibe. We discussed again today. -at her last visit with Robin Searing, they discussed changing to atorvastatin as she reported some myalgia on rosuvastatin. However, she continued rosuvastatin and has not had recurrent myalgia -she believes that she increased to 2 pills after lipids in October. Due  for recheck, ordered today  Hypertension White coat component -home blood pressures have been well controlled per report but no numbers -does not think she is on chlorthalidone at home -she will double check and will check home blood pressures, will contact me if elevated  Chronic shortness of breath Emphysema Bronchiectasis -followed before with Dr. Craige Cotta, managed by  inhalers -needs to establish with new pulmonologist, would like to been seen in [place]  CV risk counseling and prevention -recommend heart healthy/Mediterranean diet, with whole grains, fruits, vegetable, fish, lean meats, nuts, and olive oil. Limit salt. -recommend moderate walking, 3-5 times/week for 30-50 minutes each session. Aim for at least 150 minutes.week. Goal should be pace of 3 miles/hours, or walking 1.5 miles in 30 minutes -recommend avoidance of tobacco products. Avoid excess alcohol.  Dispo: 1 year or sooner as needed  Signed, Jodelle Red, MD   Jodelle Red, MD, PhD, [facility]  [facility]  [place]  [facility] at [facility] [address] [place], [place] [address] [phone]

## 2023-07-09 LAB — LIPID PANEL
Chol/HDL Ratio: 4 {ratio} (ref 0.0–4.4)
Cholesterol, Total: 178 mg/dL (ref 100–199)
HDL: 45 mg/dL (ref >39)
LDL Chol Calc (NIH): 102 mg/dL — ABNORMAL HIGH (ref 0–99)
Triglycerides: 179 mg/dL — ABNORMAL HIGH (ref 0–149)
VLDL Cholesterol Cal: 31 mg/dL (ref 5–40)

## 2023-08-04 DIAGNOSIS — D2271 Melanocytic nevi of right lower limb, including hip: Secondary | ICD-10-CM | POA: Diagnosis not present

## 2023-08-09 DIAGNOSIS — L986 Other infiltrative disorders of the skin and subcutaneous tissue: Secondary | ICD-10-CM | POA: Diagnosis not present

## 2023-08-09 DIAGNOSIS — L570 Actinic keratosis: Secondary | ICD-10-CM | POA: Diagnosis not present

## 2023-08-09 DIAGNOSIS — D485 Neoplasm of uncertain behavior of skin: Secondary | ICD-10-CM | POA: Diagnosis not present

## 2023-08-09 DIAGNOSIS — D235 Other benign neoplasm of skin of trunk: Secondary | ICD-10-CM | POA: Diagnosis not present

## 2023-08-11 ENCOUNTER — Telehealth (HOSPITAL_BASED_OUTPATIENT_CLINIC_OR_DEPARTMENT_OTHER): Payer: Self-pay

## 2023-08-11 DIAGNOSIS — I2510 Atherosclerotic heart disease of native coronary artery without angina pectoris: Secondary | ICD-10-CM

## 2023-08-11 DIAGNOSIS — E782 Mixed hyperlipidemia: Secondary | ICD-10-CM

## 2023-08-11 MED ORDER — ROSUVASTATIN CALCIUM 20 MG PO TABS: 20.0000 mg | ORAL_TABLET | Freq: Every day | ORAL | 1 refills | Status: DC

## 2023-08-11 NOTE — Telephone Encounter (Signed)
 Called and spoke to patient; discussed lab results and MD's comments. Rosuvastatin 20 mg sent to local pharmacy and lab slips mailed to patient. She verbalized understanding.

## 2023-08-11 NOTE — Telephone Encounter (Signed)
-----   Message from [facility] sent at 08/10/2023  7:59 PM EST ----- Apologies, I didn't realize she didn't have my chart, so she hasn't seen her results. Her LDL (bad cholesterol) isn't at goal yet. It is 103, and we want it less than 70. Can we confirm her dose of rosuvastatin? Options would be to increase the dose (if she is on 10, would increase to 20 mg) and recheck in 2-3 months, or keep current dose and add ezetimibe 10 mg daily and recheck lipids in 2-3 months.

## 2023-08-18 DIAGNOSIS — L719 Rosacea, unspecified: Secondary | ICD-10-CM | POA: Diagnosis not present

## 2023-08-18 DIAGNOSIS — L570 Actinic keratosis: Secondary | ICD-10-CM | POA: Diagnosis not present

## 2023-09-09 DIAGNOSIS — E782 Mixed hyperlipidemia: Secondary | ICD-10-CM | POA: Diagnosis not present

## 2023-09-10 LAB — LIPID PANEL
Chol/HDL Ratio: 3.2 ratio (ref 0.0–4.4)
Cholesterol, Total: 140 mg/dL (ref 100–199)
HDL: 44 mg/dL (ref >39)
LDL Chol Calc (NIH): 76 mg/dL (ref 0–99)
Triglycerides: 111 mg/dL (ref 0–149)
VLDL Cholesterol Cal: 20 mg/dL (ref 5–40)

## 2023-09-10 LAB — HEPATIC FUNCTION PANEL
ALT: 19 IU/L (ref 0–32)
AST: 27 IU/L (ref 0–40)
Albumin: 4.3 g/dL (ref 3.7–4.7)
Alkaline Phosphatase: 72 IU/L (ref 44–121)
Bilirubin Total: 0.5 mg/dL (ref 0.0–1.2)
Bilirubin, Direct: 0.16 mg/dL (ref 0.00–0.40)
Total Protein: 6.7 g/dL (ref 6.0–8.5)

## 2023-09-12 ENCOUNTER — Other Ambulatory Visit: Payer: Self-pay | Admitting: Nurse Practitioner

## 2023-09-19 ENCOUNTER — Other Ambulatory Visit: Payer: Self-pay | Admitting: Nurse Practitioner

## 2023-10-04 DIAGNOSIS — M79662 Pain in left lower leg: Secondary | ICD-10-CM | POA: Diagnosis not present

## 2023-10-04 DIAGNOSIS — I83893 Varicose veins of bilateral lower extremities with other complications: Secondary | ICD-10-CM | POA: Diagnosis not present

## 2023-10-04 DIAGNOSIS — M79604 Pain in right leg: Secondary | ICD-10-CM | POA: Diagnosis not present

## 2023-10-04 DIAGNOSIS — I87393 Chronic venous hypertension (idiopathic) with other complications of bilateral lower extremity: Secondary | ICD-10-CM | POA: Diagnosis not present

## 2023-10-04 DIAGNOSIS — M79661 Pain in right lower leg: Secondary | ICD-10-CM | POA: Diagnosis not present

## 2023-10-18 DIAGNOSIS — I83811 Varicose veins of right lower extremities with pain: Secondary | ICD-10-CM | POA: Diagnosis not present

## 2023-11-01 DIAGNOSIS — I83892 Varicose veins of left lower extremities with other complications: Secondary | ICD-10-CM | POA: Diagnosis not present

## 2023-11-22 DIAGNOSIS — I83891 Varicose veins of right lower extremities with other complications: Secondary | ICD-10-CM | POA: Diagnosis not present

## 2023-12-01 DIAGNOSIS — R300 Dysuria: Secondary | ICD-10-CM | POA: Diagnosis not present

## 2023-12-01 DIAGNOSIS — E538 Deficiency of other specified B group vitamins: Secondary | ICD-10-CM | POA: Diagnosis not present

## 2023-12-01 DIAGNOSIS — I10 Essential (primary) hypertension: Secondary | ICD-10-CM | POA: Diagnosis not present

## 2023-12-01 DIAGNOSIS — E559 Vitamin D deficiency, unspecified: Secondary | ICD-10-CM | POA: Diagnosis not present

## 2023-12-01 DIAGNOSIS — E7800 Pure hypercholesterolemia, unspecified: Secondary | ICD-10-CM | POA: Diagnosis not present

## 2023-12-01 DIAGNOSIS — D649 Anemia, unspecified: Secondary | ICD-10-CM | POA: Diagnosis not present

## 2024-02-07 ENCOUNTER — Telehealth (HOSPITAL_BASED_OUTPATIENT_CLINIC_OR_DEPARTMENT_OTHER): Payer: Self-pay | Admitting: Pulmonary Disease

## 2024-02-07 NOTE — Telephone Encounter (Signed)
 Copied from CRM [id number]. Topic: Clinical - Medication Question >> Feb 07, 2024  3:04 PM Joesph PARAS wrote: Reason for CRM: Patient would like an alternative to umeclidinium bromide  (INCRUSE ELLIPTA ) 62.5 MCG/ACT AEPB, as it is getting to be too expensive. Informed patient that Dr. Shellia is no longer with us . Offered patient to schedule with new provider, patient disregards and demands to know who will refill her inhaler.   PAS encouraged patient to re-establish. Patient agreeable to schedule.  Patient scheduled to see JE on 10/14. Please advise if refill is appropriate prior to visit. LOV 12/30/22

## 2024-02-07 NOTE — Telephone Encounter (Signed)
 Please advise on inhaler change pt will be new to you

## 2024-02-09 ENCOUNTER — Telehealth: Payer: Self-pay

## 2024-02-09 ENCOUNTER — Other Ambulatory Visit (HOSPITAL_COMMUNITY): Payer: Self-pay

## 2024-02-09 MED ORDER — INCRUSE ELLIPTA 62.5 MCG/ACT IN AEPB: 1.0000 | INHALATION_SPRAY | Freq: Every day | RESPIRATORY_TRACT | 5 refills | Status: AC

## 2024-02-09 NOTE — Telephone Encounter (Signed)
 I updated patient regarding inhaler pricing. She requests refill of Incruse. She does not qualify for financial assistance due to her part time job and reports frustration with taking her medicine as needed at times.

## 2024-02-09 NOTE — Telephone Encounter (Signed)
 Incruse is the only covered LAMA at this time, test claim with [facility] pharmacy shows $89.12/30 days. Covered LAMA/LABA are Anoro Ellipta ($120.24/30 days) and Bevespi ($109.32).

## 2024-02-21 ENCOUNTER — Other Ambulatory Visit (HOSPITAL_BASED_OUTPATIENT_CLINIC_OR_DEPARTMENT_OTHER): Payer: Self-pay | Admitting: Cardiology

## 2024-02-21 DIAGNOSIS — E782 Mixed hyperlipidemia: Secondary | ICD-10-CM

## 2024-02-21 DIAGNOSIS — I2510 Atherosclerotic heart disease of native coronary artery without angina pectoris: Secondary | ICD-10-CM

## 2024-03-20 ENCOUNTER — Ambulatory Visit (HOSPITAL_BASED_OUTPATIENT_CLINIC_OR_DEPARTMENT_OTHER): Admitting: Pulmonary Disease

## 2024-03-20 ENCOUNTER — Encounter (HOSPITAL_BASED_OUTPATIENT_CLINIC_OR_DEPARTMENT_OTHER): Payer: Self-pay | Admitting: Pulmonary Disease

## 2024-03-20 VITALS — BP 135/66 | HR 64 | Ht 61.0 in | Wt 124.0 lb

## 2024-03-20 DIAGNOSIS — Z7722 Contact with and (suspected) exposure to environmental tobacco smoke (acute) (chronic): Secondary | ICD-10-CM

## 2024-03-20 DIAGNOSIS — R0602 Shortness of breath: Secondary | ICD-10-CM

## 2024-03-20 DIAGNOSIS — J432 Centrilobular emphysema: Secondary | ICD-10-CM

## 2024-03-20 DIAGNOSIS — J439 Emphysema, unspecified: Secondary | ICD-10-CM | POA: Diagnosis not present

## 2024-03-20 DIAGNOSIS — R5381 Other malaise: Secondary | ICD-10-CM

## 2024-03-20 NOTE — Progress Notes (Signed)
 [facility]  Chief Complaint  Patient presents with   Emphysema    Past Surgical History:  She  has a past surgical history that includes Bladder suspension (05/2009); Cholecystectomy (2000's); Vaginal hysterectomy (~ 1977); Cataract extraction w/ intraocular lens  implant, bilateral (Bilateral, 2000's); and Anterior and posterior repair (05/2009).  Past Medical History:  Arthritis, Celiac disease, CKD 3, Collagenous colitis, GERD, HLD, HTN, IBS, Osteopenia, Vit D deficiency, Vit B12 deficiency, Varicose veins, non obstructive CAD  Constitutional:  BP 135/66   Pulse 64   Ht 5' 1 (1.549 m)   Wt 124 lb (56.2 kg)   SpO2 97%   BMI 23.43 kg/m   Brief Summary:  Ashley Mayer is a 83 y.o. female with pulmonary emphysema.      Subjective:   03/20/24 She reports currently on Incruse and not usually adherent. Rarely uses albuterol . Has shortness of breath walking long distances. Son reports wheezing. Will rest 2-3 minutes with deep breaths and then able to resume activity. Denies cough. Has noticed recent hoarseness that intermittently occurs and/or with talking. Hx of severe acid reflux.Has nasal congestion and uses sprays to help.  Having difficulty watering plants. Difficulty carrying things when walking  Physical Exam:   Physical Exam: General: Well-appearing, no acute distress HENT: [place], AT Eyes: EOMI, no scleral icterus Respiratory: Clear to auscultation bilaterally.  No crackles, wheezing or rales Cardiovascular: RRR, -M/R/G, no JVD Extremities:-Edema,-tenderness Neuro: AAO x4, CNII-XII grossly intact Psych: Normal mood, normal affect   Pulmonary testing:  PFT 12/08/22 >> FEV1 1.71 (104%), FEV1% 84, TLC 3.98 (85%), DLCO 67%  Chest Imaging:  CXR 12/04/22 showed Lt infrahilar atelectasis and lucency possibly from bronchiectasis.    Cardiac Tests:  Cardiac CT 03/16/22 >> mild emphysema, small HH, coronary calcium  score  573 Echo 03/22/22 >> EF 50 to 55%, mild/mod MR  Social History:  She  reports that she has never smoked. She has never used smokeless tobacco. She reports that she does not drink alcohol and does not use drugs.  Family History:  Her family history includes Asthma in an other family member; Cancer in her brother, father, and another family member; Diabetes in her sister; Heart attack in an other family member; Heart disease in her brother; Hemochromatosis in her son; Hyperlipidemia in her brother, mother, and another family member; Hypertension in her brother and another family member; Stroke in her brother, mother, and another family member.     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 Shortness of breath - worsening - CONTINUE Incruse ONE puff ONCE a day - CONTINUE Albuterol  1-2 puffs as needed - REFER to pulmonary rehab  CAD, mitral regurgitation. - follow up with cardiology  Deconditioning - encouraged her to maintain a regular exercise regimen  Time Spent Involved in Patient Care on Day of Examination:  31 min  Follow up:   Patient Instructions  Pulmonary emphysema with second hand tobacco exposure. - Worsening dyspnea - CONTINUE Incruse ONE puff ONCE a day - CONTINUE Albuterol  1-2 puffs as needed - REFER to pulmonary rehab  Medication List:   Allergies as of 03/20/2024       Reactions   Codeine Other (See Comments)   hallucinations   Lactose Intolerance (gi) Diarrhea   Milk (cow)    Other reaction(s): Not available   Shrimp Extract    Other reaction(s): Not available   Tomato Diarrhea   Wheat Diarrhea   Morphine  And Codeine Other (  See Comments)   Unknown-family history of allergy   Tape Itching, Rash        Medication List        Accurate as of March 20, 2024  3:52 PM. If you have any questions, ask your nurse or doctor.          albuterol  108 (90 Base) MCG/ACT inhaler Commonly known as: Ventolin  HFA Inhale  2 puffs into the lungs every 6 (six) hours as needed for wheezing or shortness of breath.   fluticasone  50 MCG/ACT nasal spray Commonly known as: FLONASE  Place 2 sprays into both nostrils daily.   Incruse Ellipta  62.5 MCG/ACT Aepb Generic drug: umeclidinium bromide  Inhale 1 puff into the lungs daily.   MAGNESIUM CITRATE PO Take 250 mg by mouth daily at 12 noon.   multivitamin-lutein  Caps capsule Take 1 capsule by mouth daily.   MYLANTA PO Take 1 tablet by mouth as needed.   OVER THE COUNTER MEDICATION Patient rotates OTC Nexium ,Tagamet and Prevacid for indigestion.   pantoprazole  40 MG tablet Commonly known as: PROTONIX  Take 40 mg by mouth daily.   PROBIOTIC DAILY PO Take by mouth.   rosuvastatin  20 MG tablet Commonly known as: CRESTOR  Take 1 tablet by mouth once daily   VITAMIN A PO Take 2,400 mcg by mouth daily at 12 noon.   VITAMIN B-12 PO Take 2,500 mg by mouth 2 (two) times daily.   VITAMIN B6 PO Take 100 mg by mouth daily at 12 noon.   Vitamin D3 1000 units Caps Take 1 tablet by mouth 2 (two) times daily.   Zinc 50 MG Tabs Take 1 tablet by mouth daily.        Signature:  Slater Staff, MD [facility] Pager - [phone] 03/20/2024, 3:52 PM

## 2024-03-20 NOTE — Patient Instructions (Signed)
 Pulmonary emphysema with second hand tobacco exposure. - Worsening dyspnea - CONTINUE Incruse ONE puff ONCE a day - CONTINUE Albuterol  1-2 puffs as needed - REFER to pulmonary rehab

## 2024-03-21 ENCOUNTER — Encounter (HOSPITAL_COMMUNITY): Payer: Self-pay

## 2024-03-21 ENCOUNTER — Telehealth (HOSPITAL_COMMUNITY): Payer: Self-pay

## 2024-03-21 NOTE — Telephone Encounter (Signed)
 Returned pt phone call in regards to pulmonary rehab, pt will come in for orientation on 10/17@1 :00 and will attend the 1:15 exercise class time.   Provided information via telephone.

## 2024-03-21 NOTE — Telephone Encounter (Signed)
 Pt insurance is active and benefits verified through [facility]. Co-pay $15, DED $0/$0 met, out of pocket $4,150/$190 met, co-insurance 0%. No pre-authorization required. 03/21/2024 @ 9:30am, spoke with Maude, REF# [id number].

## 2024-03-21 NOTE — Telephone Encounter (Signed)
 Attempted to call patient regarding pulmonary rehab- no answer, left message. Mailed letter.

## 2024-03-22 ENCOUNTER — Telehealth (HOSPITAL_COMMUNITY): Payer: Self-pay

## 2024-03-22 DIAGNOSIS — I83813 Varicose veins of bilateral lower extremities with pain: Secondary | ICD-10-CM | POA: Diagnosis not present

## 2024-03-22 DIAGNOSIS — I872 Venous insufficiency (chronic) (peripheral): Secondary | ICD-10-CM | POA: Diagnosis not present

## 2024-03-22 NOTE — Telephone Encounter (Signed)
 Called pt to confirm her PR appointment for tomorrow. No answer. Left VM.

## 2024-03-23 ENCOUNTER — Encounter (HOSPITAL_COMMUNITY)
Admission: RE | Admit: 2024-03-23 | Discharge: 2024-03-23 | Disposition: A | Discharge status: Home / Self Care | Source: Ambulatory Visit | Attending: Pulmonary Disease | Admitting: Pulmonary Disease

## 2024-03-23 ENCOUNTER — Encounter (HOSPITAL_COMMUNITY): Payer: Self-pay

## 2024-03-23 VITALS — BP 122/60 | HR 60 | Ht 60.0 in | Wt 126.8 lb

## 2024-03-23 DIAGNOSIS — J432 Centrilobular emphysema: Secondary | ICD-10-CM | POA: Diagnosis not present

## 2024-03-23 NOTE — Progress Notes (Signed)
 Pulmonary Individual Treatment Plan  Patient Details  Name: Ashley Mayer MRN: [id number] Date of Birth: [birth year] Referring Provider:   [name] Pulmonary Rehab Walk Test from 03/23/2024 in [facility]  Referring Provider [name]    Initial Encounter Date:  Flowsheet Row Pulmonary Rehab Walk Test from 03/23/2024 in [facility]  Date 03/23/24    Visit Diagnosis: Centrilobular emphysema (HCC)  Patient's Home Medications on Admission:   Current Outpatient Medications:    albuterol  (VENTOLIN  HFA) 108 (90 Base) MCG/ACT inhaler, Inhale 2 puffs into the lungs every 6 (six) hours as needed for wheezing or shortness of breath., Disp: 1 each, Rfl: 5   Calcium  & Magnesium Carbonates (MYLANTA PO), Take 1 tablet by mouth as needed., Disp: , Rfl:    Cholecalciferol (VITAMIN D3) 1000 units CAPS, Take 1 tablet by mouth 2 (two) times daily., Disp: , Rfl:    Cyanocobalamin  (VITAMIN B-12 PO), Take 2,500 mg by mouth 2 (two) times daily., Disp: , Rfl:    fluticasone  (FLONASE ) 50 MCG/ACT nasal spray, Place 2 sprays into both nostrils daily., Disp: 16 g, Rfl: 0   MAGNESIUM CITRATE PO, Take 250 mg by mouth daily at 12 noon., Disp: , Rfl:    multivitamin-lutein  (OCUVITE-LUTEIN ) CAPS, Take 1 capsule by mouth daily., Disp: , Rfl:    OVER THE COUNTER MEDICATION, Patient rotates OTC Nexium ,Tagamet and Prevacid for indigestion., Disp: , Rfl:    pantoprazole  (PROTONIX ) 40 MG tablet, Take 40 mg by mouth daily., Disp: , Rfl:    Probiotic Product (PROBIOTIC DAILY PO), Take by mouth., Disp: , Rfl:    Pyridoxine HCl (VITAMIN B6 PO), Take 100 mg by mouth daily at 12 noon., Disp: , Rfl:    rosuvastatin  (CRESTOR ) 20 MG tablet, Take 1 tablet by mouth once daily, Disp: 90 tablet, Rfl: 0   umeclidinium bromide  (INCRUSE ELLIPTA ) 62.5 MCG/ACT AEPB, Inhale 1 puff into the lungs daily., Disp: 30 each,  Rfl: 5   VITAMIN A PO, Take 2,400 mcg by mouth daily at 12 noon., Disp: , Rfl:    Zinc 50 MG TABS, Take 1 tablet by mouth daily., Disp: , Rfl:   Past Medical History: Past Medical History:  Diagnosis Date   Anticipatory grief    Arthritis    hands (09/17/2013)   Celiac disease    CKD (chronic kidney disease), stage III (HCC)    Collagenous colitis    Dyspnea on exertion    Estrogen deficiency    GERD (gastroesophageal reflux disease)    Hand arthritis    Hearing loss    Hyperlipidemia    Hypertension    IBS (irritable bowel syndrome)    Low vitamin B12 level    Multiple food allergies    Nocturnal leg cramps    Osteopenia    Sleep disturbance    Unspecified vitamin D  deficiency    Varicose veins of bilateral lower extremities with pain     Tobacco Use: Social History   Tobacco Use  Smoking Status Never  Smokeless Tobacco Never    Labs: Review Flowsheet  More data exists      Latest Ref Rng & Units 10/26/2022 02/17/2023 03/31/2023 07/08/2023 09/09/2023  Labs for ITP Cardiac and Pulmonary Rehab  Cholestrol 100 - 199 mg/dL 750  744  827  821  859   LDL (calc) 0 - 99 mg/dL 831  829  98  897  76   HDL-C >39 mg/dL 41  38  43  45  44   Trlycerides 0 - 149 mg/dL 785  750  818  820  888     Capillary Blood Glucose: Lab Results  Component Value Date   GLUCAP 90 09/18/2013   GLUCAP 95 09/17/2013   GLUCAP 85 09/17/2013     Pulmonary Assessment Scores:  Pulmonary Assessment Scores     Row Name 03/23/24 1332         ADL UCSD   ADL Phase Entry     SOB Score total 13       CAT Score   CAT Score 4       mMRC Score   mMRC Score 1       UCSD: Self-administered rating of dyspnea associated with activities of daily living (ADLs) 6-point scale (0 = not at all to 5 = maximal or unable to do because of breathlessness)  Scoring Scores range from 0 to 120.  Minimally important difference is 5 units  CAT: CAT can identify the health impairment of COPD patients  and is better correlated with disease progression.  CAT has a scoring range of zero to 40. The CAT score is classified into four groups of low (less than 10), medium (10 - 20), high (21-30) and very high (31-40) based on the impact level of disease on health status. A CAT score over 10 suggests significant symptoms.  A worsening CAT score could be explained by an exacerbation, poor medication adherence, poor inhaler technique, or progression of COPD or comorbid conditions.  CAT MCID is 2 points  mMRC: mMRC (Modified Medical Research [place]) Dyspnea Scale is used to assess the degree of baseline functional disability in patients of respiratory disease due to dyspnea. No minimal important difference is established. A decrease in score of 1 point or greater is considered a positive change.   Pulmonary Function Assessment:  Pulmonary Function Assessment - 03/23/24 1332       Breath   Bilateral Breath Sounds Clear    Shortness of Breath Yes;Limiting activity          Exercise Target Goals: Exercise Program Goal: Individual exercise prescription set using results from initial 6 min walk test and THRR while considering  patient's activity barriers and safety.   Exercise Prescription Goal: Initial exercise prescription builds to 30-45 minutes a day of aerobic activity, 2-3 days per week.  Home exercise guidelines will be given to patient during program as part of exercise prescription that the participant will acknowledge.  Activity Barriers & Risk Stratification:  Activity Barriers & Cardiac Risk Stratification - 03/23/24 1335       Activity Barriers & Cardiac Risk Stratification   Activity Barriers Back Problems;Deconditioning;Muscular Weakness;Shortness of Breath          6 Minute Walk:  6 Minute Walk     Row Name 03/23/24 1421         6 Minute Walk   Phase Initial     Distance 990 feet     Walk Time 6 minutes     # of Rest Breaks 1  2:06-2:15     MPH 1.88     METS 1.62      RPE 13     Perceived Dyspnea  1     VO2 Peak 5.66     Symptoms No     Resting HR 60 bpm     Resting BP 122/60     Resting Oxygen  Saturation  99 %     Exercise Oxygen Saturation  during 6 min walk 97 %     Max Ex. HR 106 bpm     Max Ex. BP 128/50     2 Minute Post BP 124/58       Interval HR   1 Minute HR 71     2 Minute HR 84     3 Minute HR 85     4 Minute HR 98     5 Minute HR 97     6 Minute HR 106     2 Minute Post HR 63     Interval Heart Rate? Yes       Interval Oxygen   Interval Oxygen? Yes     Baseline Oxygen Saturation % 99 %     1 Minute Oxygen Saturation % 97 %     1 Minute Liters of Oxygen 0 L     2 Minute Oxygen Saturation % 98 %     2 Minute Liters of Oxygen 0 L     3 Minute Oxygen Saturation % 100 %     3 Minute Liters of Oxygen 0 L     4 Minute Oxygen Saturation % 99 %     4 Minute Liters of Oxygen 0 L     5 Minute Oxygen Saturation % 100 %     5 Minute Liters of Oxygen 0 L     6 Minute Oxygen Saturation % 95 %     6 Minute Liters of Oxygen 0 L     2 Minute Post Oxygen Saturation % 100 %     2 Minute Post Liters of Oxygen 0 L        Oxygen Initial Assessment:  Oxygen Initial Assessment - 03/23/24 1331       Home Oxygen   Home Oxygen Device None    Sleep Oxygen Prescription None    Home Exercise Oxygen Prescription None    Home Resting Oxygen Prescription None      Initial 6 min Walk   Oxygen Used None      Program Oxygen Prescription   Program Oxygen Prescription None      Intervention   Short Term Goals To learn and understand importance of maintaining oxygen saturations>88%;To learn and demonstrate proper use of respiratory medications;To learn and understand importance of monitoring SPO2 with pulse oximeter and demonstrate accurate use of the pulse oximeter.;To learn and demonstrate proper pursed lip breathing techniques or other breathing techniques.     Long  Term Goals Maintenance of O2 saturations>88%;Compliance with respiratory  medication;Verbalizes importance of monitoring SPO2 with pulse oximeter and return demonstration;Exhibits proper breathing techniques, such as pursed lip breathing or other method taught during program session;Demonstrates proper use of MDI's          Oxygen Re-Evaluation:   Oxygen Discharge (Final Oxygen Re-Evaluation):   Initial Exercise Prescription:  Initial Exercise Prescription - 03/23/24 1400       Date of Initial Exercise RX and Referring Provider   Date 03/23/24    Referring Provider [name]    Expected Discharge Date 06/29/23      Treadmill   MPH 1.2    Grade 0    Minutes 30    METs 1.5      Prescription Details   Frequency (times per week) 2    Duration Progress to 30 minutes of continuous aerobic without signs/symptoms of physical distress  Intensity   THRR 40-80% of Max Heartrate 54-109    Ratings of Perceived Exertion 11-13    Perceived Dyspnea 0-4      Progression   Progression Continue to progress workloads to maintain intensity without signs/symptoms of physical distress.      Resistance Training   Training Prescription Yes    Weight red bands    Reps 10-15          Perform Capillary Blood Glucose checks as needed.  Exercise Prescription Changes:   Exercise Comments:   Exercise Goals and Review:   Exercise Goals     Row Name 03/23/24 1335             Exercise Goals   Increase Physical Activity Yes       Intervention Provide advice, education, support and counseling about physical activity/exercise needs.;Develop an individualized exercise prescription for aerobic and resistive training based on initial evaluation findings, risk stratification, comorbidities and participant's personal goals.       Expected Outcomes Short Term: Attend rehab on a regular basis to increase amount of physical activity.;Long Term: Add in home exercise to make exercise part of routine and to increase amount of physical activity.;Long Term: Exercising  regularly at least 3-5 days a week.       Increase Strength and Stamina Yes       Intervention Provide advice, education, support and counseling about physical activity/exercise needs.;Develop an individualized exercise prescription for aerobic and resistive training based on initial evaluation findings, risk stratification, comorbidities and participant's personal goals.       Expected Outcomes Short Term: Increase workloads from initial exercise prescription for resistance, speed, and METs.;Short Term: Perform resistance training exercises routinely during rehab and add in resistance training at home;Long Term: Improve cardiorespiratory fitness, muscular endurance and strength as measured by increased METs and functional capacity ( )       Able to understand and use rate of perceived exertion (RPE) scale Yes       Intervention Provide education and explanation on how to use RPE scale       Expected Outcomes Short Term: Able to use RPE daily in rehab to express subjective intensity level;Long Term:  Able to use RPE to guide intensity level when exercising independently       Able to understand and use Dyspnea scale Yes       Intervention Provide education and explanation on how to use Dyspnea scale       Expected Outcomes Short Term: Able to use Dyspnea scale daily in rehab to express subjective sense of shortness of breath during exertion;Long Term: Able to use Dyspnea scale to guide intensity level when exercising independently       Knowledge and understanding of Target Heart Rate Range (THRR) Yes       Intervention Provide education and explanation of THRR including how the numbers were predicted and where they are located for reference       Expected Outcomes Short Term: Able to state/look up THRR;Long Term: Able to use THRR to govern intensity when exercising independently;Short Term: Able to use daily as guideline for intensity in rehab       Understanding of Exercise Prescription Yes        Intervention Provide education, explanation, and written materials on patient's individual exercise prescription       Expected Outcomes Short Term: Able to explain program exercise prescription;Long Term: Able to explain home exercise prescription to exercise independently  Exercise Goals Re-Evaluation :   Discharge Exercise Prescription (Final Exercise Prescription Changes):   Nutrition:  Target Goals: Understanding of nutrition guidelines, daily intake of sodium 1500mg , cholesterol 200mg , calories 30% from fat and 7% or less from saturated fats, daily to have 5 or more servings of fruits and vegetables.  Biometrics:    Nutrition Therapy Plan and Nutrition Goals:   Nutrition Assessments:  MEDIFICTS Score Key: >=70 Need to make dietary changes  40-70 Heart Healthy Diet <= 40 Therapeutic Level Cholesterol Diet   Picture Your Plate Scores: <59 Unhealthy dietary pattern with much room for improvement. 41-50 Dietary pattern unlikely to meet recommendations for good health and room for improvement. 51-60 More healthful dietary pattern, with some room for improvement.  >60 Healthy dietary pattern, although there may be some specific behaviors that could be improved.    Nutrition Goals Re-Evaluation:   Nutrition Goals Discharge (Final Nutrition Goals Re-Evaluation):   Psychosocial: Target Goals: Acknowledge presence or absence of significant depression and/or stress, maximize coping skills, provide positive support system. Participant is able to verbalize types and ability to use techniques and skills needed for reducing stress and depression.  Initial Review & Psychosocial Screening:  Initial Psych Review & Screening - 03/23/24 1326       Initial Review   Current issues with None Identified      Family Dynamics   Good Support System? Yes    Comments 1 son and 1 daughter      Barriers   Psychosocial barriers to participate in program There are no  identifiable barriers or psychosocial needs.      Screening Interventions   Interventions Encouraged to exercise          Quality of Life Scores:  Scores of 19 and below usually indicate a poorer quality of life in these areas.  A difference of  2-3 points is a clinically meaningful difference.  A difference of 2-3 points in the total score of the Quality of Life Index has been associated with significant improvement in overall quality of life, self-image, physical symptoms, and general health in studies assessing change in quality of life.  PHQ-9: Review Flowsheet  More data exists      03/23/2024 09/08/2017 08/19/2016 05/17/2016 06/19/2015  Depression screen PHQ 2/9  Decreased Interest 0 0 0 0 0  Down, Depressed, Hopeless 0 0 0 0 0  PHQ - 2 Score 0 0 0 0 0  Altered sleeping 0 - - - -  Tired, decreased energy 0 - - - -  Change in appetite 0 - - - -  Feeling bad or failure about yourself  0 - - - -  Trouble concentrating 0 - - - -  Moving slowly or fidgety/restless 0 - - - -  Suicidal thoughts 0 - - - -  PHQ-9 Score 0 - - - -  Difficult doing work/chores Not difficult at all - - - -   Interpretation of Total Score  Total Score Depression Severity:  1-4 = Minimal depression, 5-9 = Mild depression, 10-14 = Moderate depression, 15-19 = Moderately severe depression, 20-27 = Severe depression   Psychosocial Evaluation and Intervention:  Psychosocial Evaluation - 03/23/24 1328       Psychosocial Evaluation & Interventions   Interventions Encouraged to exercise with the program and follow exercise prescription    Comments Bruna denies any psychosocial barriers at this time.    Expected Outcomes For Bruna to participate in rehab free of concerns.  Continue Psychosocial Services  No Follow up required          Psychosocial Re-Evaluation:   Psychosocial Discharge (Final Psychosocial Re-Evaluation):   Education: Education Goals: Education classes will be provided on a weekly  basis, covering required topics. Participant will state understanding/return demonstration of topics presented.  Learning Barriers/Preferences:  Learning Barriers/Preferences - 03/23/24 1402       Learning Barriers/Preferences   Learning Barriers None    Learning Preferences None          Education Topics: Know Your Numbers Group instruction that is supported by a PowerPoint presentation. Instructor discusses importance of knowing and understanding resting, exercise, and post-exercise oxygen saturation, heart rate, and blood pressure. Oxygen saturation, heart rate, blood pressure, rating of perceived exertion, and dyspnea are reviewed along with a normal range for these values.    Exercise for the Pulmonary Patient Group instruction that is supported by a PowerPoint presentation. Instructor discusses benefits of exercise, core components of exercise, frequency, duration, and intensity of an exercise routine, importance of utilizing pulse oximetry during exercise, safety while exercising, and options of places to exercise outside of rehab.    MET Level  Group instruction provided by PowerPoint, verbal discussion, and written material to support subject matter. Instructor reviews what METs are and how to increase METs.    Pulmonary Medications Verbally interactive group education provided by instructor with focus on inhaled medications and proper administration.   Anatomy and Physiology of the Respiratory System Group instruction provided by PowerPoint, verbal discussion, and written material to support subject matter. Instructor reviews respiratory cycle and anatomical components of the respiratory system and their functions. Instructor also reviews differences in obstructive and restrictive respiratory diseases with examples of each.    Oxygen Safety Group instruction provided by PowerPoint, verbal discussion, and written material to support subject matter. There is an overview of  "What is Oxygen" and "Why do we need it".  Instructor also reviews how to create a safe environment for oxygen use, the importance of using oxygen as prescribed, and the risks of noncompliance. There is a brief discussion on traveling with oxygen and resources the patient may utilize.   Oxygen Use Group instruction provided by PowerPoint, verbal discussion, and written material to discuss how supplemental oxygen is prescribed and different types of oxygen supply systems. Resources for more information are provided.    Breathing Techniques Group instruction that is supported by demonstration and informational handouts. Instructor discusses the benefits of pursed lip and diaphragmatic breathing and detailed demonstration on how to perform both.     Risk Factor Reduction Group instruction that is supported by a PowerPoint presentation. Instructor discusses the definition of a risk factor, different risk factors for pulmonary disease, and how the heart and lungs work together.   Pulmonary Diseases Group instruction provided by PowerPoint, verbal discussion, and written material to support subject matter. Instructor gives an overview of the different type of pulmonary diseases. There is also a discussion on risk factors and symptoms as well as ways to manage the diseases.   Stress and Energy Conservation Group instruction provided by PowerPoint, verbal discussion, and written material to support subject matter. Instructor gives an overview of stress and the impact it can have on the body. Instructor also reviews ways to reduce stress. There is also a discussion on energy conservation and ways to conserve energy throughout the day.   Warning Signs and Symptoms Group instruction provided by PowerPoint, verbal discussion, and written material to support subject  matter. Instructor reviews warning signs and symptoms of stroke, heart attack, cold and flu. Instructor also reviews ways to prevent the  spread of infection.   Other Education Group or individual verbal, written, or video instructions that support the educational goals of the pulmonary rehab program.    Knowledge Questionnaire Score:  Knowledge Questionnaire Score - 03/23/24 1402       Knowledge Questionnaire Score   Pre Score 14/18          Core Components/Risk Factors/Patient Goals at Admission:  Personal Goals and Risk Factors at Admission - 03/23/24 1330       Core Components/Risk Factors/Patient Goals on Admission   Improve shortness of breath with ADL's Yes    Intervention Provide education, individualized exercise plan and daily activity instruction to help decrease symptoms of SOB with activities of daily living.    Expected Outcomes Short Term: Improve cardiorespiratory fitness to achieve a reduction of symptoms when performing ADLs;Long Term: Be able to perform more ADLs without symptoms or delay the onset of symptoms          Core Components/Risk Factors/Patient Goals Review:    Core Components/Risk Factors/Patient Goals at Discharge (Final Review):    ITP Comments: Dr. Slater Staff is Medical Director for Pulmonary Rehab at [facility].

## 2024-03-23 NOTE — Progress Notes (Signed)
 Ashley Mayer 83 y.o. female Pulmonary Rehab Orientation Note This patient who was referred to Pulmonary Rehab by Dr. [name] with the diagnosis of Centrilobular emphysema arrived today in Cardiac and Pulmonary Rehab. She  arrived ambulatory with normal gait. She  does not carry portable oxygen. Per patient, Che uses oxygen never. Color good, skin warm and dry. Patient is oriented to time and place. Patient's medical history, psychosocial health, and medications reviewed. Psychosocial assessment reveals patient lives with family. Kerryn is currently retired. Patient hobbies include crafting and painting. Patient reports her stress level is nonexistent. Areas of stress/anxiety include N/A. Patient does not exhibit signs of depression. PHQ2/9 score 0/0. Chassie shows good  coping skills with positive outlook on life. Offered emotional support and reassurance. Will continue to monitor. Physical assessment performed by Ronal Levin RN. Please see their orientation physical assessment note. Darnelle reports she does take medications as prescribed. Patient states she follows a regular  diet. The patient reports no specific efforts to gain or lose weight.. Patient's weight will be monitored closely. Demonstration and practice of PLB using pulse oximeter. Isis able to return demonstration satisfactorily. Safety and hand hygiene in the exercise area reviewed with patient. Anselma voices understanding of the information reviewed. Department expectations discussed with patient and achievable goals were set. The patient shows enthusiasm about attending the program and we look forward to working with Avelina. Khrystyna completed a 6 min walk test today and is scheduled to begin exercise on 03/29/24 at 1:15 pm.  1245-1400 Culley Hedeen J Marly Schuld, MS, ACSM-CEP

## 2024-03-23 NOTE — Progress Notes (Signed)
 Pulmonary Rehab Orientation Assessment  Patient Details  Name: Ashley Mayer MRN: [id number] Date of Birth: July 08, 1940 Referring Provider:  Dr. [name]  Well appearing, A&Ox4, NAD Eyes/Ears: WNL  Lungs: Clear with no wheezes, rales, rhonchi, denies chronic cough, dyspnea on exertion Heart: Regular rate rhythm, no murmurs, no rubs, no clicks Gastrointestinal: abdomin soft, + bowel sounds in all 4 quads, denies recent weight gain or loss, endorses normal BMs Genitourinary: WNL, pt denies s/s Extremities:  +2 pulses, grip strength equal, strong, no edema, no cyanosis, no clubbing Integumentary: pt denies any rashes, open or non healing wounds Psy/Soc: Pt denies, PHQ 2/9 scores 0/0 Assistive devices: None

## 2024-03-27 ENCOUNTER — Encounter (HOSPITAL_BASED_OUTPATIENT_CLINIC_OR_DEPARTMENT_OTHER): Payer: Self-pay | Admitting: Pulmonary Disease

## 2024-03-29 ENCOUNTER — Encounter (HOSPITAL_COMMUNITY)
Admission: RE | Admit: 2024-03-29 | Discharge: 2024-03-29 | Disposition: A | Discharge status: Home / Self Care | Source: Ambulatory Visit | Attending: Pulmonary Disease | Admitting: Pulmonary Disease

## 2024-03-29 DIAGNOSIS — J432 Centrilobular emphysema: Secondary | ICD-10-CM | POA: Diagnosis not present

## 2024-03-29 NOTE — Progress Notes (Signed)
 Daily Session Note  Patient Details  Name: Ashley Mayer MRN: [id number] Date of Birth: 09/25/1940 Referring Provider:   [name] [facility] Walk Test from 03/23/2024 in [facility]  Referring Provider [name]    Encounter Date: 03/29/2024  Check In:  Session Check In - 03/29/24 1346       Check-In   Supervising physician immediately available to respond to emergencies [facility] MD immediately available    Physician(s) Damien Braver, NP    Location [facility]    Staff Present Ronal Levin, RN, Maud Moats, MS, ACSM-CEP, Exercise Physiologist;Randi Midge HECKLE, ACSM-CEP, Exercise Physiologist;Casey Claudene, RT    Virtual Visit No    Medication changes reported     No    Fall or balance concerns reported    No    Tobacco Cessation No Change    Warm-up and Cool-down Performed as group-led instruction    Resistance Training Performed Yes    VAD Patient? No    PAD/SET Patient? No      Pain Assessment   Currently in Pain? No/denies    Multiple Pain Sites No          Capillary Blood Glucose: No results found for this or any previous visit (from the past 24 hours).    Social History   Tobacco Use  Smoking Status Never  Smokeless Tobacco Never    Goals Met:  Exercise tolerated well Queuing for purse lip breathing No report of concerns or symptoms today Strength training completed today  Goals Unmet:  Not Applicable  Comments: Service time is from 1316 to 1449    Dr. Slater [name] is Medical Director for Pulmonary Rehab at [facility].

## 2024-04-03 ENCOUNTER — Encounter (HOSPITAL_COMMUNITY)
Admission: RE | Admit: 2024-04-03 | Discharge: 2024-04-03 | Disposition: A | Source: Ambulatory Visit | Attending: Pulmonary Disease

## 2024-04-03 VITALS — Wt 125.4 lb

## 2024-04-03 DIAGNOSIS — J432 Centrilobular emphysema: Secondary | ICD-10-CM

## 2024-04-04 DIAGNOSIS — K529 Noninfective gastroenteritis and colitis, unspecified: Secondary | ICD-10-CM | POA: Diagnosis not present

## 2024-04-04 NOTE — Progress Notes (Signed)
 Pulmonary Individual Treatment Plan  Patient Details  Name: Ashley Mayer MRN: [id number] Date of Birth: 12-21-1940 Referring Provider:   [name] Pulmonary Rehab Walk Test from 03/23/2024 in [facility]  Referring Provider [name]    Initial Encounter Date:  Flowsheet Row Pulmonary Rehab Walk Test from 03/23/2024 in [facility]  Date 03/23/24    Visit Diagnosis: Centrilobular emphysema (HCC)  Patient's Home Medications on Admission:   Current Outpatient Medications:    albuterol  (VENTOLIN  HFA) 108 (90 Base) MCG/ACT inhaler, Inhale 2 puffs into the lungs every 6 (six) hours as needed for wheezing or shortness of breath., Disp: 1 each, Rfl: 5   Calcium  & Magnesium Carbonates (MYLANTA PO), Take 1 tablet by mouth as needed., Disp: , Rfl:    Cholecalciferol (VITAMIN D3) 1000 units CAPS, Take 1 tablet by mouth 2 (two) times daily., Disp: , Rfl:    Cyanocobalamin  (VITAMIN B-12 PO), Take 2,500 mg by mouth 2 (two) times daily., Disp: , Rfl:    fluticasone  (FLONASE ) 50 MCG/ACT nasal spray, Place 2 sprays into both nostrils daily., Disp: 16 g, Rfl: 0   MAGNESIUM CITRATE PO, Take 250 mg by mouth daily at 12 noon., Disp: , Rfl:    multivitamin-lutein  (OCUVITE-LUTEIN ) CAPS, Take 1 capsule by mouth daily., Disp: , Rfl:    OVER THE COUNTER MEDICATION, Patient rotates OTC Nexium ,Tagamet and Prevacid for indigestion., Disp: , Rfl:    pantoprazole  (PROTONIX ) 40 MG tablet, Take 40 mg by mouth daily., Disp: , Rfl:    Probiotic Product (PROBIOTIC DAILY PO), Take by mouth., Disp: , Rfl:    Pyridoxine HCl (VITAMIN B6 PO), Take 100 mg by mouth daily at 12 noon., Disp: , Rfl:    rosuvastatin  (CRESTOR ) 20 MG tablet, Take 1 tablet by mouth once daily, Disp: 90 tablet, Rfl: 0   umeclidinium bromide  (INCRUSE ELLIPTA ) 62.5 MCG/ACT AEPB, Inhale 1 puff into the lungs daily., Disp: 30 each,  Rfl: 5   VITAMIN A PO, Take 2,400 mcg by mouth daily at 12 noon., Disp: , Rfl:    Zinc 50 MG TABS, Take 1 tablet by mouth daily., Disp: , Rfl:   Past Medical History: Past Medical History:  Diagnosis Date   Anticipatory grief    Arthritis    hands (09/17/2013)   Celiac disease    CKD (chronic kidney disease), stage III (HCC)    Collagenous colitis    Dyspnea on exertion    Estrogen deficiency    GERD (gastroesophageal reflux disease)    Hand arthritis    Hearing loss    Hyperlipidemia    Hypertension    IBS (irritable bowel syndrome)    Low vitamin B12 level    Multiple food allergies    Nocturnal leg cramps    Osteopenia    Sleep disturbance    Unspecified vitamin D  deficiency    Varicose veins of bilateral lower extremities with pain     Tobacco Use: Social History   Tobacco Use  Smoking Status Never  Smokeless Tobacco Never    Labs: Review Flowsheet  More data exists      Latest Ref Rng & Units 10/26/2022 02/17/2023 03/31/2023 07/08/2023 09/09/2023  Labs for ITP Cardiac and Pulmonary Rehab  Cholestrol 100 - 199 mg/dL 750  744  827  821  859   LDL (calc) 0 - 99 mg/dL 831  829  98  897  76   HDL-C >39 mg/dL 41  38  43  45  44   Trlycerides 0 - 149 mg/dL 785  750  818  820  888     Capillary Blood Glucose: Lab Results  Component Value Date   GLUCAP 90 09/18/2013   GLUCAP 95 09/17/2013   GLUCAP 85 09/17/2013     Pulmonary Assessment Scores:  Pulmonary Assessment Scores     Row Name 03/23/24 1332         ADL UCSD   ADL Phase Entry     SOB Score total 13       CAT Score   CAT Score 4       mMRC Score   mMRC Score 1       UCSD: Self-administered rating of dyspnea associated with activities of daily living (ADLs) 6-point scale (0 = not at all to 5 = maximal or unable to do because of breathlessness)  Scoring Scores range from 0 to 120.  Minimally important difference is 5 units  CAT: CAT can identify the health impairment of COPD patients  and is better correlated with disease progression.  CAT has a scoring range of zero to 40. The CAT score is classified into four groups of low (less than 10), medium (10 - 20), high (21-30) and very high (31-40) based on the impact level of disease on health status. A CAT score over 10 suggests significant symptoms.  A worsening CAT score could be explained by an exacerbation, poor medication adherence, poor inhaler technique, or progression of COPD or comorbid conditions.  CAT MCID is 2 points  mMRC: mMRC (Modified Medical Research [place]) Dyspnea Scale is used to assess the degree of baseline functional disability in patients of respiratory disease due to dyspnea. No minimal important difference is established. A decrease in score of 1 point or greater is considered a positive change.   Pulmonary Function Assessment:  Pulmonary Function Assessment - 03/23/24 1332       Breath   Bilateral Breath Sounds Clear    Shortness of Breath Yes;Limiting activity          Exercise Target Goals: Exercise Program Goal: Individual exercise prescription set using results from initial 6 min walk test and THRR while considering  patient's activity barriers and safety.   Exercise Prescription Goal: Initial exercise prescription builds to 30-45 minutes a day of aerobic activity, 2-3 days per week.  Home exercise guidelines will be given to patient during program as part of exercise prescription that the participant will acknowledge.  Activity Barriers & Risk Stratification:  Activity Barriers & Cardiac Risk Stratification - 03/23/24 1335       Activity Barriers & Cardiac Risk Stratification   Activity Barriers Back Problems;Deconditioning;Muscular Weakness;Shortness of Breath          6 Minute Walk:  6 Minute Walk     Row Name 03/23/24 1421         6 Minute Walk   Phase Initial     Distance 990 feet     Walk Time 6 minutes     # of Rest Breaks 1  2:06-2:15     MPH 1.88     METS 1.62      RPE 13     Perceived Dyspnea  1     VO2 Peak 5.66     Symptoms No     Resting HR 60 bpm     Resting BP 122/60     Resting Oxygen  Saturation  99 %     Exercise Oxygen Saturation  during 6 min walk 97 %     Max Ex. HR 106 bpm     Max Ex. BP 128/50     2 Minute Post BP 124/58       Interval HR   1 Minute HR 71     2 Minute HR 84     3 Minute HR 85     4 Minute HR 98     5 Minute HR 97     6 Minute HR 106     2 Minute Post HR 63     Interval Heart Rate? Yes       Interval Oxygen   Interval Oxygen? Yes     Baseline Oxygen Saturation % 99 %     1 Minute Oxygen Saturation % 97 %     1 Minute Liters of Oxygen 0 L     2 Minute Oxygen Saturation % 98 %     2 Minute Liters of Oxygen 0 L     3 Minute Oxygen Saturation % 100 %     3 Minute Liters of Oxygen 0 L     4 Minute Oxygen Saturation % 99 %     4 Minute Liters of Oxygen 0 L     5 Minute Oxygen Saturation % 100 %     5 Minute Liters of Oxygen 0 L     6 Minute Oxygen Saturation % 95 %     6 Minute Liters of Oxygen 0 L     2 Minute Post Oxygen Saturation % 100 %     2 Minute Post Liters of Oxygen 0 L        Oxygen Initial Assessment:  Oxygen Initial Assessment - 03/23/24 1331       Home Oxygen   Home Oxygen Device None    Sleep Oxygen Prescription None    Home Exercise Oxygen Prescription None    Home Resting Oxygen Prescription None      Initial 6 min Walk   Oxygen Used None      Program Oxygen Prescription   Program Oxygen Prescription None      Intervention   Short Term Goals To learn and understand importance of maintaining oxygen saturations>88%;To learn and demonstrate proper use of respiratory medications;To learn and understand importance of monitoring SPO2 with pulse oximeter and demonstrate accurate use of the pulse oximeter.;To learn and demonstrate proper pursed lip breathing techniques or other breathing techniques.     Long  Term Goals Maintenance of O2 saturations>88%;Compliance with respiratory  medication;Verbalizes importance of monitoring SPO2 with pulse oximeter and return demonstration;Exhibits proper breathing techniques, such as pursed lip breathing or other method taught during program session;Demonstrates proper use of MDI's          Oxygen Re-Evaluation:  Oxygen Re-Evaluation     Row Name 03/28/24 1454             Program Oxygen Prescription   Program Oxygen Prescription None         Home Oxygen   Home Oxygen Device None       Sleep Oxygen Prescription None       Home Exercise Oxygen Prescription None       Home Resting Oxygen Prescription None         Goals/Expected Outcomes   Short Term Goals To learn and understand importance of maintaining oxygen saturations>88%;To learn and demonstrate proper use of  respiratory medications;To learn and understand importance of monitoring SPO2 with pulse oximeter and demonstrate accurate use of the pulse oximeter.;To learn and demonstrate proper pursed lip breathing techniques or other breathing techniques.        Long  Term Goals Maintenance of O2 saturations>88%;Compliance with respiratory medication;Verbalizes importance of monitoring SPO2 with pulse oximeter and return demonstration;Exhibits proper breathing techniques, such as pursed lip breathing or other method taught during program session;Demonstrates proper use of MDI's       Goals/Expected Outcomes Compliance and understanding of oxygen saturation monitoring and breathing techniques to decrease shortness of breath.          Oxygen Discharge (Final Oxygen Re-Evaluation):  Oxygen Re-Evaluation - 03/28/24 1454       Program Oxygen Prescription   Program Oxygen Prescription None      Home Oxygen   Home Oxygen Device None    Sleep Oxygen Prescription None    Home Exercise Oxygen Prescription None    Home Resting Oxygen Prescription None      Goals/Expected Outcomes   Short Term Goals To learn and understand importance of maintaining oxygen saturations>88%;To  learn and demonstrate proper use of respiratory medications;To learn and understand importance of monitoring SPO2 with pulse oximeter and demonstrate accurate use of the pulse oximeter.;To learn and demonstrate proper pursed lip breathing techniques or other breathing techniques.     Long  Term Goals Maintenance of O2 saturations>88%;Compliance with respiratory medication;Verbalizes importance of monitoring SPO2 with pulse oximeter and return demonstration;Exhibits proper breathing techniques, such as pursed lip breathing or other method taught during program session;Demonstrates proper use of MDI's    Goals/Expected Outcomes Compliance and understanding of oxygen saturation monitoring and breathing techniques to decrease shortness of breath.          Initial Exercise Prescription:  Initial Exercise Prescription - 03/23/24 1400       Date of Initial Exercise RX and Referring Provider   Date 03/23/24    Referring Provider [name]    Expected Discharge Date 06/29/23      Treadmill   MPH 1.2    Grade 0    Minutes 30    METs 1.5      Prescription Details   Frequency (times per week) 2    Duration Progress to 30 minutes of continuous aerobic without signs/symptoms of physical distress      Intensity   THRR 40-80% of Max Heartrate 54-109    Ratings of Perceived Exertion 11-13    Perceived Dyspnea 0-4      Progression   Progression Continue to progress workloads to maintain intensity without signs/symptoms of physical distress.      Resistance Training   Training Prescription Yes    Weight red bands    Reps 10-15          Perform Capillary Blood Glucose checks as needed.  Exercise Prescription Changes:   Exercise Prescription Changes     Row Name 04/03/24 1500             Response to Exercise   Blood Pressure (Admit) 116/60       Blood Pressure (Exercise) 148/60       Blood Pressure (Exit) 120/40       Heart Rate (Admit) 64 bpm       Heart Rate (Exercise) 85 bpm        Heart Rate (Exit) 76 bpm       Oxygen Saturation (Admit) 98 %       Oxygen  Saturation (Exercise) 100 %       Oxygen Saturation (Exit) 99 %       Rating of Perceived Exertion (Exercise) 13       Perceived Dyspnea (Exercise) 0       Duration Continue with 30 min of aerobic exercise without signs/symptoms of physical distress.       Intensity THRR unchanged         Resistance Training   Training Prescription Yes       Weight red bands       Reps 10-15       Time 10 Minutes         Track   Laps 23       Minutes 30       METs 3.2          Exercise Comments:   Exercise Goals and Review:   Exercise Goals     Row Name 03/23/24 1335             Exercise Goals   Increase Physical Activity Yes       Intervention Provide advice, education, support and counseling about physical activity/exercise needs.;Develop an individualized exercise prescription for aerobic and resistive training based on initial evaluation findings, risk stratification, comorbidities and participant's personal goals.       Expected Outcomes Short Term: Attend rehab on a regular basis to increase amount of physical activity.;Long Term: Add in home exercise to make exercise part of routine and to increase amount of physical activity.;Long Term: Exercising regularly at least 3-5 days a week.       Increase Strength and Stamina Yes       Intervention Provide advice, education, support and counseling about physical activity/exercise needs.;Develop an individualized exercise prescription for aerobic and resistive training based on initial evaluation findings, risk stratification, comorbidities and participant's personal goals.       Expected Outcomes Short Term: Increase workloads from initial exercise prescription for resistance, speed, and METs.;Short Term: Perform resistance training exercises routinely during rehab and add in resistance training at home;Long Term: Improve cardiorespiratory fitness, muscular  endurance and strength as measured by increased METs and functional capacity ( )       Able to understand and use rate of perceived exertion (RPE) scale Yes       Intervention Provide education and explanation on how to use RPE scale       Expected Outcomes Short Term: Able to use RPE daily in rehab to express subjective intensity level;Long Term:  Able to use RPE to guide intensity level when exercising independently       Able to understand and use Dyspnea scale Yes       Intervention Provide education and explanation on how to use Dyspnea scale       Expected Outcomes Short Term: Able to use Dyspnea scale daily in rehab to express subjective sense of shortness of breath during exertion;Long Term: Able to use Dyspnea scale to guide intensity level when exercising independently       Knowledge and understanding of Target Heart Rate Range (THRR) Yes       Intervention Provide education and explanation of THRR including how the numbers were predicted and where they are located for reference       Expected Outcomes Short Term: Able to state/look up THRR;Long Term: Able to use THRR to govern intensity when exercising independently;Short Term: Able to use daily as guideline for intensity in rehab  Understanding of Exercise Prescription Yes       Intervention Provide education, explanation, and written materials on patient's individual exercise prescription       Expected Outcomes Short Term: Able to explain program exercise prescription;Long Term: Able to explain home exercise prescription to exercise independently          Exercise Goals Re-Evaluation :  Exercise Goals Re-Evaluation     Row Name 03/28/24 1453             Exercise Goal Re-Evaluation   Exercise Goals Review Increase Physical Activity;Able to understand and use Dyspnea scale;Understanding of Exercise Prescription;Increase Strength and Stamina;Knowledge and understanding of Target Heart Rate Range (THRR);Able to understand  and use rate of perceived exertion (RPE) scale       Comments Pt to begin exercise 03/29/24. Will progress as tolerated.       Expected Outcomes Through exercise at rehab and home, the patient will decrease shortness of breath with daily activities and feel confident in carrying out an exercise regimen at home.          Discharge Exercise Prescription (Final Exercise Prescription Changes):  Exercise Prescription Changes - 04/03/24 1500       Response to Exercise   Blood Pressure (Admit) 116/60    Blood Pressure (Exercise) 148/60    Blood Pressure (Exit) 120/40    Heart Rate (Admit) 64 bpm    Heart Rate (Exercise) 85 bpm    Heart Rate (Exit) 76 bpm    Oxygen Saturation (Admit) 98 %    Oxygen Saturation (Exercise) 100 %    Oxygen Saturation (Exit) 99 %    Rating of Perceived Exertion (Exercise) 13    Perceived Dyspnea (Exercise) 0    Duration Continue with 30 min of aerobic exercise without signs/symptoms of physical distress.    Intensity THRR unchanged      Resistance Training   Training Prescription Yes    Weight red bands    Reps 10-15    Time 10 Minutes      Track   Laps 23    Minutes 30    METs 3.2          Nutrition:  Target Goals: Understanding of nutrition guidelines, daily intake of sodium 1500mg , cholesterol 200mg , calories 30% from fat and 7% or less from saturated fats, daily to have 5 or more servings of fruits and vegetables.  Biometrics:    Nutrition Therapy Plan and Nutrition Goals:   Nutrition Assessments:  MEDIFICTS Score Key: >=70 Need to make dietary changes  40-70 Heart Healthy Diet <= 40 Therapeutic Level Cholesterol Diet   Picture Your Plate Scores: <59 Unhealthy dietary pattern with much room for improvement. 41-50 Dietary pattern unlikely to meet recommendations for good health and room for improvement. 51-60 More healthful dietary pattern, with some room for improvement.  >60 Healthy dietary pattern, although there may be some  specific behaviors that could be improved.    Nutrition Goals Re-Evaluation:   Nutrition Goals Discharge (Final Nutrition Goals Re-Evaluation):   Psychosocial: Target Goals: Acknowledge presence or absence of significant depression and/or stress, maximize coping skills, provide positive support system. Participant is able to verbalize types and ability to use techniques and skills needed for reducing stress and depression.  Initial Review & Psychosocial Screening:  Initial Psych Review & Screening - 03/23/24 1326       Initial Review   Current issues with None Identified      Family Dynamics   Good Support  System? Yes    Comments 1 son and 1 daughter      Barriers   Psychosocial barriers to participate in program There are no identifiable barriers or psychosocial needs.      Screening Interventions   Interventions Encouraged to exercise          Quality of Life Scores:  Scores of 19 and below usually indicate a poorer quality of life in these areas.  A difference of  2-3 points is a clinically meaningful difference.  A difference of 2-3 points in the total score of the Quality of Life Index has been associated with significant improvement in overall quality of life, self-image, physical symptoms, and general health in studies assessing change in quality of life.  PHQ-9: Review Flowsheet  More data exists      03/23/2024 09/08/2017 08/19/2016 05/17/2016 06/19/2015  Depression screen PHQ 2/9  Decreased Interest 0 0 0 0 0  Down, Depressed, Hopeless 0 0 0 0 0  PHQ - 2 Score 0 0 0 0 0  Altered sleeping 0 - - - -  Tired, decreased energy 0 - - - -  Change in appetite 0 - - - -  Feeling bad or failure about yourself  0 - - - -  Trouble concentrating 0 - - - -  Moving slowly or fidgety/restless 0 - - - -  Suicidal thoughts 0 - - - -  PHQ-9 Score 0 - - - -  Difficult doing work/chores Not difficult at all - - - -   Interpretation of Total Score  Total Score Depression  Severity:  1-4 = Minimal depression, 5-9 = Mild depression, 10-14 = Moderate depression, 15-19 = Moderately severe depression, 20-27 = Severe depression   Psychosocial Evaluation and Intervention:  Psychosocial Evaluation - 03/23/24 1328       Psychosocial Evaluation & Interventions   Interventions Encouraged to exercise with the program and follow exercise prescription    Comments Ashley Mayer denies any psychosocial barriers at this time.    Expected Outcomes For Ashley Mayer to participate in rehab free of concerns.    Continue Psychosocial Services  No Follow up required          Psychosocial Re-Evaluation:  Psychosocial Re-Evaluation     Row Name 03/30/24 1404             Psychosocial Re-Evaluation   Current issues with None Identified       Comments Monthly psychosocial re-evaluation as follows: No changes since orientation. Ashley Mayer has completed 1 session so far. At the time of orientation, she denied any resources, needs or referrals for her psy/soc health.       Expected Outcomes For Ashley Mayer to continue to participate in rehab free of concerns or barriers.       Interventions Encouraged to attend Pulmonary Rehabilitation for the exercise       Continue Psychosocial Services  No Follow up required          Psychosocial Discharge (Final Psychosocial Re-Evaluation):  Psychosocial Re-Evaluation - 03/30/24 1404       Psychosocial Re-Evaluation   Current issues with None Identified    Comments Monthly psychosocial re-evaluation as follows: No changes since orientation. Ashley Mayer has completed 1 session so far. At the time of orientation, she denied any resources, needs or referrals for her psy/soc health.    Expected Outcomes For Ashley Mayer to continue to participate in rehab free of concerns or barriers.    Interventions Encouraged to attend Pulmonary Rehabilitation for  the exercise    Continue Psychosocial Services  No Follow up required          Education: Education Goals: Education classes will be  provided on a weekly basis, covering required topics. Participant will state understanding/return demonstration of topics presented.  Learning Barriers/Preferences:  Learning Barriers/Preferences - 03/23/24 1402       Learning Barriers/Preferences   Learning Barriers None    Learning Preferences None          Education Topics: Know Your Numbers Group instruction that is supported by a PowerPoint presentation. Instructor discusses importance of knowing and understanding resting, exercise, and post-exercise oxygen saturation, heart rate, and blood pressure. Oxygen saturation, heart rate, blood pressure, rating of perceived exertion, and dyspnea are reviewed along with a normal range for these values.    Exercise for the Pulmonary Patient Group instruction that is supported by a PowerPoint presentation. Instructor discusses benefits of exercise, core components of exercise, frequency, duration, and intensity of an exercise routine, importance of utilizing pulse oximetry during exercise, safety while exercising, and options of places to exercise outside of rehab.    MET Level  Group instruction provided by PowerPoint, verbal discussion, and written material to support subject matter. Instructor reviews what METs are and how to increase METs.    Pulmonary Medications Verbally interactive group education provided by instructor with focus on inhaled medications and proper administration.   Anatomy and Physiology of the Respiratory System Group instruction provided by PowerPoint, verbal discussion, and written material to support subject matter. Instructor reviews respiratory cycle and anatomical components of the respiratory system and their functions. Instructor also reviews differences in obstructive and restrictive respiratory diseases with examples of each.    Oxygen Safety Group instruction provided by PowerPoint, verbal discussion, and written material to support subject matter.  There is an overview of "What is Oxygen" and "Why do we need it".  Instructor also reviews how to create a safe environment for oxygen use, the importance of using oxygen as prescribed, and the risks of noncompliance. There is a brief discussion on traveling with oxygen and resources the patient may utilize.   Oxygen Use Group instruction provided by PowerPoint, verbal discussion, and written material to discuss how supplemental oxygen is prescribed and different types of oxygen supply systems. Resources for more information are provided.    Breathing Techniques Group instruction that is supported by demonstration and informational handouts. Instructor discusses the benefits of pursed lip and diaphragmatic breathing and detailed demonstration on how to perform both.     Risk Factor Reduction Group instruction that is supported by a PowerPoint presentation. Instructor discusses the definition of a risk factor, different risk factors for pulmonary disease, and how the heart and lungs work together.   Pulmonary Diseases Group instruction provided by PowerPoint, verbal discussion, and written material to support subject matter. Instructor gives an overview of the different type of pulmonary diseases. There is also a discussion on risk factors and symptoms as well as ways to manage the diseases.   Stress and Energy Conservation Group instruction provided by PowerPoint, verbal discussion, and written material to support subject matter. Instructor gives an overview of stress and the impact it can have on the body. Instructor also reviews ways to reduce stress. There is also a discussion on energy conservation and ways to conserve energy throughout the day.   Warning Signs and Symptoms Group instruction provided by PowerPoint, verbal discussion, and written material to support subject matter. Instructor reviews warning signs and  symptoms of stroke, heart attack, cold and flu. Instructor also reviews  ways to prevent the spread of infection. Flowsheet Row PULMONARY REHAB OTHER RESPIRATORY from 03/29/2024 in [facility] for Heart, Vascular, & Lung Health  Date 03/29/24  Educator RN  Instruction Review Code 1- Verbalizes Understanding    Other Education Group or individual verbal, written, or video instructions that support the educational goals of the pulmonary rehab program.    Knowledge Questionnaire Score:  Knowledge Questionnaire Score - 03/23/24 1402       Knowledge Questionnaire Score   Pre Score 14/18          Core Components/Risk Factors/Patient Goals at Admission:  Personal Goals and Risk Factors at Admission - 03/23/24 1330       Core Components/Risk Factors/Patient Goals on Admission   Improve shortness of breath with ADL's Yes    Intervention Provide education, individualized exercise plan and daily activity instruction to help decrease symptoms of SOB with activities of daily living.    Expected Outcomes Short Term: Improve cardiorespiratory fitness to achieve a reduction of symptoms when performing ADLs;Long Term: Be able to perform more ADLs without symptoms or delay the onset of symptoms          Core Components/Risk Factors/Patient Goals Review:   Goals and Risk Factor Review     Row Name 03/30/24 1405             Core Components/Risk Factors/Patient Goals Review   Personal Goals Review Develop more efficient breathing techniques such as purse lipped breathing and diaphragmatic breathing and practicing self-pacing with activity.;Improve shortness of breath with ADL's       Review Monthly review of patient's Core Components/Risk Factors/Patient Goals are as follows: Unable to assess goals yet. Ashley Mayer has completed 1 session so far. Ashley Mayer will continue to benefit from the PR program for exercise and education.       Expected Outcomes Pt will show progress toward meeting expected goals and outcomes.          Core Components/Risk  Factors/Patient Goals at Discharge (Final Review):   Goals and Risk Factor Review - 03/30/24 1405       Core Components/Risk Factors/Patient Goals Review   Personal Goals Review Develop more efficient breathing techniques such as purse lipped breathing and diaphragmatic breathing and practicing self-pacing with activity.;Improve shortness of breath with ADL's    Review Monthly review of patient's Core Components/Risk Factors/Patient Goals are as follows: Unable to assess goals yet. Ashley Mayer has completed 1 session so far. Ashley Mayer will continue to benefit from the PR program for exercise and education.    Expected Outcomes Pt will show progress toward meeting expected goals and outcomes.          ITP Comments: Pt is making expected progress toward Pulmonary Rehab goals after completing 2 session(s). Recommend continued exercise, life style modification, education, and utilization of breathing techniques to increase stamina and strength, while also decreasing shortness of breath with exertion.   Comments: Dr. Slater Staff is Medical Director for Pulmonary Rehab at [facility].

## 2024-04-05 ENCOUNTER — Encounter (HOSPITAL_COMMUNITY)

## 2024-04-05 ENCOUNTER — Telehealth (HOSPITAL_COMMUNITY): Payer: Self-pay

## 2024-04-05 NOTE — Telephone Encounter (Signed)
 Patient c/o sick for 1:15 PR class, states she has severe diarrhea. Informed patient she must be 48 hour symptom-free before returning to class.

## 2024-04-10 ENCOUNTER — Encounter (HOSPITAL_COMMUNITY)

## 2024-04-12 ENCOUNTER — Encounter (HOSPITAL_COMMUNITY)
Admission: RE | Admit: 2024-04-12 | Discharge: 2024-04-12 | Disposition: A | Discharge status: Home / Self Care | Source: Ambulatory Visit | Attending: Pulmonary Disease | Admitting: Pulmonary Disease

## 2024-04-12 DIAGNOSIS — J432 Centrilobular emphysema: Secondary | ICD-10-CM | POA: Diagnosis not present

## 2024-04-12 NOTE — Progress Notes (Signed)
 Daily Session Note  Patient Details  Name: Ashley Mayer MRN: [id number] Date of Birth: 09/03/1940 Referring Provider:   [name] Pulmonary Rehab Walk Test from 03/23/2024 in [facility] for Heart, Vascular, & Lung Health  Referring Provider [name]    Encounter Date: 04/12/2024  Check In:  Session Check In - 04/12/24 1330       Check-In   Supervising physician immediately available to respond to emergencies [facility] MD immediately available    Physician(s) Orren Fabry, NP    Location [facility]    Staff Present Ronal Levin, RN, Maud Moats, MS, ACSM-CEP, Exercise Physiologist;Casey Claudene, RT    Virtual Visit No    Medication changes reported     No    Fall or balance concerns reported    No    Tobacco Cessation No Change    Warm-up and Cool-down Performed as group-led instruction    Resistance Training Performed Yes    VAD Patient? No    PAD/SET Patient? No      Pain Assessment   Currently in Pain? No/denies    Multiple Pain Sites No          Capillary Blood Glucose: No results found for this or any previous visit (from the past 24 hours).    Social History   Tobacco Use  Smoking Status Never  Smokeless Tobacco Never    Goals Met:  Exercise tolerated well Queuing for purse lip breathing No report of concerns or symptoms today Strength training completed today  Goals Unmet:  Not Applicable  Comments: Service time is from 1313 to 1429    Dr. Slater [name] is Medical Director for Pulmonary Rehab at [facility].

## 2024-04-17 ENCOUNTER — Encounter (HOSPITAL_COMMUNITY)
Admission: RE | Admit: 2024-04-17 | Discharge: 2024-04-17 | Disposition: A | Discharge status: Home / Self Care | Source: Ambulatory Visit | Attending: Pulmonary Disease | Admitting: Pulmonary Disease

## 2024-04-17 VITALS — Wt 124.6 lb

## 2024-04-17 DIAGNOSIS — J432 Centrilobular emphysema: Secondary | ICD-10-CM | POA: Diagnosis not present

## 2024-04-17 NOTE — Progress Notes (Signed)
 Daily Session Note  Patient Details  Name: Ashley Mayer MRN: [id number] Date of Birth: 1940/08/14 Referring Provider:   [name] Pulmonary Rehab Walk Test from 03/23/2024 in [facility] for Heart, Vascular, & Lung Health  Referring Provider [name]    Encounter Date: 04/17/2024  Check In:  Session Check In - 04/17/24 1411       Check-In   Supervising physician immediately available to respond to emergencies [facility] MD immediately available    Physician(s) Barnie Hila, NP    [facility]    Staff Present Ronal Levin, RN, Maud Moats, MS, ACSM-CEP, Exercise Physiologist;Casey Claudene Idelia Aris BS, ACSM-CEP, Exercise Physiologist;Annedrea Violetta, RN, MHA    Virtual Visit No    Medication changes reported     No    Fall or balance concerns reported    No    Tobacco Cessation No Change    Warm-up and Cool-down Performed as group-led instruction    Resistance Training Performed Yes    VAD Patient? No    PAD/SET Patient? No      Pain Assessment   Currently in Pain? No/denies    Multiple Pain Sites No          Capillary Blood Glucose: No results found for this or any previous visit (from the past 24 hours).   Exercise Prescription Changes - 04/17/24 1500       Response to Exercise   Blood Pressure (Admit) 140/58    Blood Pressure (Exercise) 140/60    Blood Pressure (Exit) 108/68    Heart Rate (Admit) 73 bpm    Heart Rate (Exercise) 98 bpm    Heart Rate (Exit) 75 bpm    Oxygen Saturation (Admit) 99 %    Oxygen Saturation (Exercise) 95 %    Oxygen Saturation (Exit) 99 %    Rating of Perceived Exertion (Exercise) 13    Perceived Dyspnea (Exercise) 1    Duration Continue with 30 min of aerobic exercise without signs/symptoms of physical distress.    Intensity THRR unchanged      Resistance Training   Training Prescription Yes    Weight red bands    Reps 10-15    Time 10 Minutes      NuStep    Level 2    SPM 85    Minutes 15    METs 2.7      Track   Laps 12    Minutes 15    METs 2.73          Social History   Tobacco Use  Smoking Status Never  Smokeless Tobacco Never    Goals Met:  Exercise tolerated well Queuing for purse lip breathing No report of concerns or symptoms today Strength training completed today  Goals Unmet:  Not Applicable  Comments: Service time is from 1307 to 1443    Dr. Slater [name] is Medical Director for Pulmonary Rehab at [facility].

## 2024-04-19 ENCOUNTER — Encounter (HOSPITAL_COMMUNITY)
Admission: RE | Admit: 2024-04-19 | Discharge: 2024-04-19 | Disposition: A | Discharge status: Home / Self Care | Source: Ambulatory Visit | Attending: Pulmonary Disease | Admitting: Pulmonary Disease

## 2024-04-19 DIAGNOSIS — J432 Centrilobular emphysema: Secondary | ICD-10-CM | POA: Diagnosis not present

## 2024-04-19 NOTE — Progress Notes (Signed)
 Daily Session Note  Patient Details  Name: Ashley Mayer MRN: [id number] Date of Birth: 1941/03/21 Referring Provider:   [name] Pulmonary Rehab Walk Test from 03/23/2024 in [facility] for Heart, Vascular, & Lung Health  Referring Provider [name]    Encounter Date: 04/19/2024  Check In:  Session Check In - 04/19/24 1334       Check-In   Supervising physician immediately available to respond to emergencies [facility] MD immediately available    Physician(s) Barnie Hila, NP    Location [facility]    Staff Present Ronal Levin, RN, Maud Moats, MS, ACSM-CEP, Exercise Physiologist;Casey Claudene Idelia Aris BS, ACSM-CEP, Exercise Physiologist    Virtual Visit No    Medication changes reported     No    Fall or balance concerns reported    No    Tobacco Cessation No Change    Warm-up and Cool-down Performed as group-led instruction    Resistance Training Performed Yes    VAD Patient? No    PAD/SET Patient? No      Pain Assessment   Currently in Pain? No/denies    Multiple Pain Sites No          Capillary Blood Glucose: No results found for this or any previous visit (from the past 24 hours).    Social History   Tobacco Use  Smoking Status Never  Smokeless Tobacco Never    Goals Met:  Exercise tolerated well Queuing for purse lip breathing No report of concerns or symptoms today Strength training completed today  Goals Unmet:  Not Applicable  Comments: Service time is from 1308 to 1443    Dr. Slater [name] is Medical Director for Pulmonary Rehab at [facility].

## 2024-04-23 ENCOUNTER — Ambulatory Visit (HOSPITAL_BASED_OUTPATIENT_CLINIC_OR_DEPARTMENT_OTHER): Admitting: Cardiology

## 2024-04-23 ENCOUNTER — Encounter (HOSPITAL_BASED_OUTPATIENT_CLINIC_OR_DEPARTMENT_OTHER): Payer: Self-pay | Admitting: Cardiology

## 2024-04-23 VITALS — BP 108/68 | HR 62 | Ht 60.0 in | Wt 125.0 lb

## 2024-04-23 DIAGNOSIS — I10 Essential (primary) hypertension: Secondary | ICD-10-CM

## 2024-04-23 DIAGNOSIS — I340 Nonrheumatic mitral (valve) insufficiency: Secondary | ICD-10-CM | POA: Diagnosis not present

## 2024-04-23 DIAGNOSIS — I739 Peripheral vascular disease, unspecified: Secondary | ICD-10-CM | POA: Diagnosis not present

## 2024-04-23 DIAGNOSIS — R0602 Shortness of breath: Secondary | ICD-10-CM | POA: Diagnosis not present

## 2024-04-23 DIAGNOSIS — I2510 Atherosclerotic heart disease of native coronary artery without angina pectoris: Secondary | ICD-10-CM | POA: Diagnosis not present

## 2024-04-23 DIAGNOSIS — R011 Cardiac murmur, unspecified: Secondary | ICD-10-CM | POA: Diagnosis not present

## 2024-04-23 DIAGNOSIS — E782 Mixed hyperlipidemia: Secondary | ICD-10-CM

## 2024-04-23 NOTE — Progress Notes (Signed)
 Cardiology Office Note:  .   Date:  04/23/2024  ID:  Ashley Mayer, DOB 1941-01-01, MRN [id number] PCP: Rolinda Millman, MD   HeartCare Providers Cardiologist:  Shelda Bruckner, MD {  History of Present Illness: .   Ashley Mayer is a 83 y.o. female with PMH hypertension, hyperlipidemia, celiac disease, emphysema/bronchiectasis. She was previously seen by Dr. Alveta and Dr. Claudene, established care with me on 07/08/23.  Pertinent CV history: seen for chest pain/DOE remotely. Echo 2023 with EF 50-55%, RV normal, mild-moderate MR, aortic sclerosis without stenosis.  Ca score 2023 was 573 (70th %ile).   Follows with pulmonology for emphysema/bronchiectasis.  Today: Doing pulmonary rehab twice a week. Feelings a slight improvement in her breathing. Has had a few dizzy spells, lasts a few seconds. These occur at rest, no clear triggers. Not clearly vertigo. Feels like a passing wave. No presyncope or syncope.  She has a strong family history of CAD. She is concerned about her risk. Also told that she has plaque in her legs at Dr. Catherine at [facility]. She sometimes has sharp, shooting pain from her toes to her calves, both at rest and with exertion intermittently. Sometimes has pain at night as well. No nonhealing wounds. She walks a lot, no routine claudication symptoms but sometimes does note cramping that improves with rest. We discussed further evaluation, see below.  Doesn't check BP routinely at home but reports it is normal. She is also having in checked in pulmonary rehab. Reviewed at pulmonary rehab visit 11/11: initial BP 140/58, at end of session was 108/68.  Had upper back pain, was self limited, was raking leaves. She thinks it was MSK, has had similar symptoms in other places in her back and side before.  Asking about a calcium  score, we reviewed that we are already aware that she has calcification and is already on medication.   ROS: Denies chest  pressure, PND, orthopnea, LE edema or unexpected weight gain. No syncope or palpitations. ROS otherwise negative except as noted.   Studies Reviewed: SABRA    EKG:       Physical Exam:   VS:  BP 108/68 Comment: pulmonary rehab  Pulse 62   Ht 5' (1.524 m)   Wt 125 lb (56.7 kg)   SpO2 97%   BMI 24.41 kg/m    Wt Readings from Last 3 Encounters:  04/23/24 125 lb (56.7 kg)  04/17/24 124 lb 9 oz (56.5 kg)  04/03/24 125 lb 7.1 oz (56.9 kg)    GEN: Well nourished, well developed in no acute distress HEENT: Normal, moist mucous membranes NECK: No JVD CARDIAC: regular rhythm, normal S1 and S2, no rubs or gallops. 1/6 systolic murmur. VASCULAR: Radial pulses 2+ bilaterally. No carotid bruits RESPIRATORY:  Clear to auscultation without rales, wheezing or rhonchi  ABDOMEN: Soft, non-tender, non-distended MUSCULOSKELETAL:  Ambulates independently SKIN: Warm and dry, no edema NEUROLOGIC:  Alert and oriented x 3. No focal neuro deficits noted. PSYCHIATRIC:  Normal affect    ASSESSMENT AND PLAN: .    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 Discussed adding ezetimibe vs. Continue with exercise -we discussed plaque, calcification,  recommendations for management, symptoms to watch for today -reviewed red flag warning signs that need immediate medical attention  Hypertension White coat component -currently on no medications. Initially elevated on office visit. Reviewed pulmonary rehab notes, was well controlled in documented vitals  Chronic shortness of  breath Emphysema Bronchiectasis -followed by Dr. [name], doing pulmonary rehab right now  CV risk counseling and prevention -recommend heart healthy/Mediterranean diet, with whole grains, fruits, vegetable, fish, lean meats, nuts, and olive oil. Limit salt. -recommend moderate walking, 3-5 times/week for 30-50 minutes each session. Aim for at least 150 minutes.week. Goal should be pace of 3 miles/hours, or walking 1.5 miles in 30 minutes -recommend avoidance of tobacco products. Avoid excess alcohol.  Dispo: 6 mos or sooner as needed  Total time of encounter: I spent 41 minutes dedicated to the care of this patient on the date of this encounter to include pre-visit review of records, face-to-face time with the patient discussing conditions above, and clinical documentation with the electronic health record. We specifically spent time today discussing her symptoms, what to watch for, goals for cholesterol, what calcium  score tell us , discussion of PAD and evaluation.   Signed, Shelda Bruckner, MD   Shelda Bruckner, MD, PhD, [facility]  [facility]  [place]  Heart & Vascular at [facility] at [facility] [address] [place], [place] [address] [phone]

## 2024-04-23 NOTE — Patient Instructions (Signed)
 Medication Instructions:   Your physician recommends that you continue on your current medications as directed. Please refer to the Current Medication list given to you today.   *If you need a refill on your cardiac medications before your next appointment, please call your pharmacy*  Lab Work:  None ordered.  If you have labs (blood work) drawn today and your tests are completely normal, you will receive your results only by: MyChart Message (if you have MyChart) OR A paper copy in the mail If you have any lab test that is abnormal or we need to change your treatment, we will call you to review the results.  Testing/Procedures:  Your physician has requested that you have an ankle brachial index (ABI). During this test an ultrasound and blood pressure cuff are used to evaluate the arteries that supply the arms and legs with blood. Allow thirty minutes for this exam. There are no restrictions or special instructions.  Please note: We ask at that you not bring children with you during ultrasound (echo/ vascular) testing. Due to room size and safety concerns, children are not allowed in the ultrasound rooms during exams. Our front office staff cannot provide observation of children in our lobby area while testing is being conducted. An adult accompanying a patient to their appointment will only be allowed in the ultrasound room at the discretion of the ultrasound technician under special circumstances. We apologize for any inconvenience.   Follow-Up: At [facility], you and your health needs are our priority.  As part of our continuing mission to provide you with exceptional heart care, our providers are all part of one team.  This team includes your primary Cardiologist (physician) and Advanced Practice Providers or APPs (Physician Assistants and Nurse Practitioners) who all work together to provide you with the care you need, when you need it.  Your next appointment:   6  month(s)  Provider:   Shelda Bruckner, MD, Rosaline Bane, NP, or Reche Finder, NP    We recommend signing up for the patient portal called MyChart.  Sign up information is provided on this After Visit Summary.  MyChart is used to connect with patients for Virtual Visits (Telemedicine).  Patients are able to view lab/test results, encounter notes, upcoming appointments, etc.  Non-urgent messages can be sent to your provider as well.   To learn more about what you can do with MyChart, go to [email or web address].   Other Instructions  Your physician wants you to follow-up in: 6 months.  You will receive a reminder letter in the mail two months in advance. If you don't receive a letter, please call our office to schedule the follow-up appointment.

## 2024-04-24 ENCOUNTER — Encounter (HOSPITAL_COMMUNITY)
Admission: RE | Admit: 2024-04-24 | Discharge: 2024-04-24 | Disposition: A | Discharge status: Home / Self Care | Source: Ambulatory Visit | Attending: Pulmonary Disease | Admitting: Pulmonary Disease

## 2024-04-24 DIAGNOSIS — J432 Centrilobular emphysema: Secondary | ICD-10-CM

## 2024-04-24 NOTE — Progress Notes (Signed)
 Daily Session Note  Patient Details  Name: Ashley Mayer MRN: [id number] Date of Birth: [birth year] Referring Provider:   [name] Pulmonary Rehab Walk Test from 03/23/2024 in [facility] for Heart, Vascular, & Lung Health  Referring Provider [name]    Encounter Date: 04/24/2024  Check In:  Session Check In - 04/24/24 1429       Check-In   Supervising physician immediately available to respond to emergencies [facility] MD immediately available    Physician(s) Josefa Beauvais, NP    Location [facility]    Staff Present Ronal Levin, RN, Maud Moats, MS, ACSM-CEP, Exercise Physiologist;Casey Claudene Idelia Aris BS, ACSM-CEP, Exercise Physiologist    Virtual Visit No    Medication changes reported     No    Fall or balance concerns reported    No    Tobacco Cessation No Change    Warm-up and Cool-down Performed as group-led instruction    Resistance Training Performed Yes    VAD Patient? No    PAD/SET Patient? No      Pain Assessment   Currently in Pain? No/denies    Multiple Pain Sites No          Capillary Blood Glucose: No results found for this or any previous visit (from the past 24 hours).    Social History   Tobacco Use  Smoking Status Never  Smokeless Tobacco Never    Goals Met:  Proper associated with RPD/PD & O2 Sat Independence with exercise equipment Exercise tolerated well No report of concerns or symptoms today Strength training completed today  Goals Unmet:  Not Applicable  Comments: Service time is from 1306 to 1443.    Dr. Slater [name] is Medical Director for Pulmonary Rehab at [facility].

## 2024-04-26 ENCOUNTER — Encounter (HOSPITAL_COMMUNITY)
Admission: RE | Admit: 2024-04-26 | Discharge: 2024-04-26 | Disposition: A | Discharge status: Home / Self Care | Source: Ambulatory Visit | Attending: Pulmonary Disease | Admitting: Pulmonary Disease

## 2024-04-26 VITALS — Wt 125.7 lb

## 2024-04-26 DIAGNOSIS — J432 Centrilobular emphysema: Secondary | ICD-10-CM

## 2024-04-26 NOTE — Progress Notes (Signed)
 Daily Session Note  Patient Details  Name: Ashley Mayer MRN: [id number] Date of Birth: [birth year] Referring Provider:   [name] Pulmonary Rehab Walk Test from 03/23/2024 in [facility] for Heart, Vascular, & Lung Health  Referring Provider [name]    Encounter Date: 04/26/2024  Check In:  Session Check In - 04/26/24 1324       Check-In   Supervising physician immediately available to respond to emergencies [facility] MD immediately available    Physician(s) Orren Fabry, PA    Location [facility]    Staff Present Ronal Levin, RN, Maud Moats, MS, ACSM-CEP, Exercise Physiologist;Casey Claudene Idelia Aris BS, ACSM-CEP, Exercise Physiologist    Virtual Visit No    Medication changes reported     No    Fall or balance concerns reported    No    Tobacco Cessation No Change    Warm-up and Cool-down Performed as group-led instruction    Resistance Training Performed Yes    VAD Patient? No    PAD/SET Patient? No      Pain Assessment   Currently in Pain? No/denies    Multiple Pain Sites No          Capillary Blood Glucose: No results found for this or any previous visit (from the past 24 hours).    Social History   Tobacco Use  Smoking Status Never  Smokeless Tobacco Never    Goals Met:  Proper associated with RPD/PD & O2 Sat Independence with exercise equipment Exercise tolerated well No report of concerns or symptoms today Strength training completed today  Goals Unmet:  Not Applicable  Comments: Service time is from 1303 to 1438.    Dr. Slater [name] is Medical Director for Pulmonary Rehab at [facility].

## 2024-05-01 ENCOUNTER — Telehealth (HOSPITAL_COMMUNITY): Payer: Self-pay

## 2024-05-01 ENCOUNTER — Encounter (HOSPITAL_COMMUNITY): Admission: RE | Admit: 2024-05-01 | Source: Ambulatory Visit

## 2024-05-01 NOTE — Telephone Encounter (Signed)
 Patient c/o for 1:15 PR class, states she has vertigo.

## 2024-05-01 NOTE — Addendum Note (Signed)
 Encounter addended by: Claudene Augustin NOVAK, RRT on: 05/01/2024 3:16 PM  Actions taken: Flowsheet accepted

## 2024-05-01 NOTE — Progress Notes (Incomplete)
 Daily Session Note  Patient Details  Name: Ashley Mayer MRN: [id number] Date of Birth: July 13, 1940 Referring Provider:   [name] Pulmonary Rehab Walk Test from 03/23/2024 in [facility] for Heart, Vascular, & Lung Health  Referring Provider [name]    Encounter Date: 04/26/2024  Check In:   Capillary Blood Glucose: No results found for this or any previous visit (from the past 24 hours).    Social History   Tobacco Use  Smoking Status Never  Smokeless Tobacco Never    Goals Met:  {[facility] AMB CP REHAB GOALS [phone]}  Goals Unmet:  {[facility] AMB CP REHAB GOALS UNMET:[phone]}  Comments: ***   {[facility] AMB CP REHAB MEDICAL DIRECTORS:[phone]}

## 2024-05-02 NOTE — Progress Notes (Signed)
 Pulmonary Individual Treatment Plan  Patient Details  Name: Ashley Mayer MRN: [id number] Date of Birth: Oct 15, 1940 Referring Provider:   [name] Pulmonary Rehab Walk Test from 03/23/2024 in [facility]  Referring Provider [name]    Initial Encounter Date:  Flowsheet Row Pulmonary Rehab Walk Test from 03/23/2024 in [facility], [facility], [facility] Lung [facility]  Date 03/23/24    Visit Diagnosis: Centrilobular emphysema (HCC)  Patient's Home Medications on Admission:   Current Outpatient Medications:    albuterol  (VENTOLIN  HFA) 108 (90 Base) MCG/ACT inhaler, Inhale 2 puffs into the lungs every 6 (six) hours as needed for wheezing or shortness of breath., Disp: 1 each, Rfl: 5   Calcium  & Magnesium Carbonates (MYLANTA PO), Take 1 tablet by mouth as needed., Disp: , Rfl:    Cholecalciferol (VITAMIN D3) 1000 units CAPS, Take 1 tablet by mouth 2 (two) times daily., Disp: , Rfl:    Cyanocobalamin  (VITAMIN B-12 PO), Take 2,500 mg by mouth 2 (two) times daily., Disp: , Rfl:    fluticasone  (FLONASE ) 50 MCG/ACT nasal spray, Place 2 sprays into both nostrils daily., Disp: 16 g, Rfl: 0   MAGNESIUM CITRATE PO, Take 250 mg by mouth daily at 12 noon. (Patient not taking: Reported on 04/23/2024), Disp: , Rfl:    multivitamin-lutein  (OCUVITE-LUTEIN ) CAPS, Take 1 capsule by mouth daily. (Patient not taking: Reported on 04/23/2024), Disp: , Rfl:    OVER THE COUNTER MEDICATION, Patient rotates OTC Nexium ,Tagamet and Prevacid for indigestion., Disp: , Rfl:    pantoprazole  (PROTONIX ) 40 MG tablet, Take 40 mg by mouth daily., Disp: , Rfl:    Probiotic Product (PROBIOTIC DAILY PO), Take by mouth., Disp: , Rfl:    Pyridoxine HCl (VITAMIN B6 PO), Take 100 mg by mouth daily at 12 noon. (Patient not taking: Reported on 04/23/2024), Disp: , Rfl:    rosuvastatin  (CRESTOR ) 20 MG tablet, Take 1 tablet by mouth once daily,  Disp: 90 tablet, Rfl: 0   umeclidinium bromide  (INCRUSE ELLIPTA ) 62.5 MCG/ACT AEPB, Inhale 1 puff into the lungs daily., Disp: 30 each, Rfl: 5   VITAMIN A PO, Take 2,400 mcg by mouth daily at 12 noon., Disp: , Rfl:    Zinc 50 MG TABS, Take 1 tablet by mouth daily., Disp: , Rfl:   Past Medical History: Past Medical History:  Diagnosis Date   Anticipatory grief    Arthritis    hands (09/17/2013)   Celiac disease    CKD (chronic kidney disease), stage III (HCC)    Collagenous colitis    Dyspnea on exertion    Estrogen deficiency    GERD (gastroesophageal reflux disease)    Hand arthritis    Hearing loss    Hyperlipidemia    Hypertension    IBS (irritable bowel syndrome)    Low vitamin B12 level    Multiple food allergies    Nocturnal leg cramps    Osteopenia    Sleep disturbance    Unspecified vitamin D  deficiency    Varicose veins of bilateral lower extremities with pain     Tobacco Use: Social History   Tobacco Use  Smoking Status Never  Smokeless Tobacco Never    Labs: Review Flowsheet  More data exists      Latest Ref Rng & Units 10/26/2022 02/17/2023 03/31/2023 07/08/2023 09/09/2023  Labs for ITP Cardiac and Pulmonary Rehab  Cholestrol 100 - 199 mg/dL 750  744  827  178  140   LDL (calc) 0 - 99 mg/dL 831  829  98  897  76   HDL-C >39 mg/dL 41  38  43  45  44   Trlycerides 0 - 149 mg/dL 785  750  818  820  888     Capillary Blood Glucose: Lab Results  Component Value Date   GLUCAP 90 09/18/2013   GLUCAP 95 09/17/2013   GLUCAP 85 09/17/2013     Pulmonary Assessment Scores:  Pulmonary Assessment Scores     Row Name 03/23/24 1332         ADL UCSD   ADL Phase Entry     SOB Score total 13       CAT Score   CAT Score 4       mMRC Score   mMRC Score 1       UCSD: Self-administered rating of dyspnea associated with activities of daily living (ADLs) 6-point scale (0 = not at all to 5 = maximal or unable to do because of breathlessness)  Scoring  Scores range from 0 to 120.  Minimally important difference is 5 units  CAT: CAT can identify the health impairment of COPD patients and is better correlated with disease progression.  CAT has a scoring range of zero to 40. The CAT score is classified into four groups of low (less than 10), medium (10 - 20), high (21-30) and very high (31-40) based on the impact level of disease on health status. A CAT score over 10 suggests significant symptoms.  A worsening CAT score could be explained by an exacerbation, poor medication adherence, poor inhaler technique, or progression of COPD or comorbid conditions.  CAT MCID is 2 points  mMRC: mMRC (Modified Medical Research [place]) Dyspnea Scale is used to assess the degree of baseline functional disability in patients of respiratory disease due to dyspnea. No minimal important difference is established. A decrease in score of 1 point or greater is considered a positive change.   Pulmonary Function Assessment:  Pulmonary Function Assessment - 03/23/24 1332       Breath   Bilateral Breath Sounds Clear    Shortness of Breath Yes;Limiting activity          Exercise Target Goals: Exercise Program Goal: Individual exercise prescription set using results from initial 6 min walk test and THRR while considering  patient's activity barriers and safety.   Exercise Prescription Goal: Initial exercise prescription builds to 30-45 minutes a day of aerobic activity, 2-3 days per week.  Home exercise guidelines will be given to patient during program as part of exercise prescription that the participant will acknowledge.  Activity Barriers & Risk Stratification:  Activity Barriers & Cardiac Risk Stratification - 03/23/24 1335       Activity Barriers & Cardiac Risk Stratification   Activity Barriers Back Problems;Deconditioning;Muscular Weakness;Shortness of Breath          6 Minute Walk:  6 Minute Walk     Row Name 03/23/24 1421         6  Minute Walk   Phase Initial     Distance 990 feet     Walk Time 6 minutes     # of Rest Breaks 1  2:06-2:15     MPH 1.88     METS 1.62     RPE 13     Perceived Dyspnea  1     VO2 Peak 5.66     Symptoms No  Resting HR 60 bpm     Resting BP 122/60     Resting Oxygen Saturation  99 %     Exercise Oxygen Saturation  during 6 min walk 97 %     Max Ex. HR 106 bpm     Max Ex. BP 128/50     2 Minute Post BP 124/58       Interval HR   1 Minute HR 71     2 Minute HR 84     3 Minute HR 85     4 Minute HR 98     5 Minute HR 97     6 Minute HR 106     2 Minute Post HR 63     Interval Heart Rate? Yes       Interval Oxygen   Interval Oxygen? Yes     Baseline Oxygen Saturation % 99 %     1 Minute Oxygen Saturation % 97 %     1 Minute Liters of Oxygen 0 L     2 Minute Oxygen Saturation % 98 %     2 Minute Liters of Oxygen 0 L     3 Minute Oxygen Saturation % 100 %     3 Minute Liters of Oxygen 0 L     4 Minute Oxygen Saturation % 99 %     4 Minute Liters of Oxygen 0 L     5 Minute Oxygen Saturation % 100 %     5 Minute Liters of Oxygen 0 L     6 Minute Oxygen Saturation % 95 %     6 Minute Liters of Oxygen 0 L     2 Minute Post Oxygen Saturation % 100 %     2 Minute Post Liters of Oxygen 0 L        Oxygen Initial Assessment:  Oxygen Initial Assessment - 03/23/24 1331       Home Oxygen   Home Oxygen Device None    Sleep Oxygen Prescription None    Home Exercise Oxygen Prescription None    Home Resting Oxygen Prescription None      Initial 6 min Walk   Oxygen Used None      Program Oxygen Prescription   Program Oxygen Prescription None      Intervention   Short Term Goals To learn and understand importance of maintaining oxygen saturations>88%;To learn and demonstrate proper use of respiratory medications;To learn and understand importance of monitoring SPO2 with pulse oximeter and demonstrate accurate use of the pulse oximeter.;To learn and demonstrate proper pursed  lip breathing techniques or other breathing techniques.     Long  Term Goals Maintenance of O2 saturations>88%;Compliance with respiratory medication;Verbalizes importance of monitoring SPO2 with pulse oximeter and return demonstration;Exhibits proper breathing techniques, such as pursed lip breathing or other method taught during program session;Demonstrates proper use of MDI's          Oxygen Re-Evaluation:  Oxygen Re-Evaluation     Row Name 03/28/24 1454 04/26/24 0944           Program Oxygen Prescription   Program Oxygen Prescription None None        Home Oxygen   Home Oxygen Device None None      Sleep Oxygen Prescription None None      Home Exercise Oxygen Prescription None None      Home Resting Oxygen Prescription None None        Goals/Expected Outcomes   Short  Term Goals To learn and understand importance of maintaining oxygen saturations>88%;To learn and demonstrate proper use of respiratory medications;To learn and understand importance of monitoring SPO2 with pulse oximeter and demonstrate accurate use of the pulse oximeter.;To learn and demonstrate proper pursed lip breathing techniques or other breathing techniques.  To learn and understand importance of maintaining oxygen saturations>88%;To learn and demonstrate proper use of respiratory medications;To learn and understand importance of monitoring SPO2 with pulse oximeter and demonstrate accurate use of the pulse oximeter.;To learn and demonstrate proper pursed lip breathing techniques or other breathing techniques.       Long  Term Goals Maintenance of O2 saturations>88%;Compliance with respiratory medication;Verbalizes importance of monitoring SPO2 with pulse oximeter and return demonstration;Exhibits proper breathing techniques, such as pursed lip breathing or other method taught during program session;Demonstrates proper use of MDI's Maintenance of O2 saturations>88%;Compliance with respiratory medication;Verbalizes  importance of monitoring SPO2 with pulse oximeter and return demonstration;Exhibits proper breathing techniques, such as pursed lip breathing or other method taught during program session;Demonstrates proper use of MDI's      Goals/Expected Outcomes Compliance and understanding of oxygen saturation monitoring and breathing techniques to decrease shortness of breath. Compliance and understanding of oxygen saturation monitoring and breathing techniques to decrease shortness of breath.         Oxygen Discharge (Final Oxygen Re-Evaluation):  Oxygen Re-Evaluation - 04/26/24 0944       Program Oxygen Prescription   Program Oxygen Prescription None      Home Oxygen   Home Oxygen Device None    Sleep Oxygen Prescription None    Home Exercise Oxygen Prescription None    Home Resting Oxygen Prescription None      Goals/Expected Outcomes   Short Term Goals To learn and understand importance of maintaining oxygen saturations>88%;To learn and demonstrate proper use of respiratory medications;To learn and understand importance of monitoring SPO2 with pulse oximeter and demonstrate accurate use of the pulse oximeter.;To learn and demonstrate proper pursed lip breathing techniques or other breathing techniques.     Long  Term Goals Maintenance of O2 saturations>88%;Compliance with respiratory medication;Verbalizes importance of monitoring SPO2 with pulse oximeter and return demonstration;Exhibits proper breathing techniques, such as pursed lip breathing or other method taught during program session;Demonstrates proper use of MDI's    Goals/Expected Outcomes Compliance and understanding of oxygen saturation monitoring and breathing techniques to decrease shortness of breath.          Initial Exercise Prescription:  Initial Exercise Prescription - 03/23/24 1400       Date of Initial Exercise RX and Referring Provider   Date 03/23/24    Referring Provider [name]    Expected Discharge Date 06/29/23       Treadmill   MPH 1.2    Grade 0    Minutes 30    METs 1.5      Prescription Details   Frequency (times per week) 2    Duration Progress to 30 minutes of continuous aerobic without signs/symptoms of physical distress      Intensity   THRR 40-80% of Max Heartrate 54-109    Ratings of Perceived Exertion 11-13    Perceived Dyspnea 0-4      Progression   Progression Continue to progress workloads to maintain intensity without signs/symptoms of physical distress.      Resistance Training   Training Prescription Yes    Weight red bands    Reps 10-15          Perform Capillary Blood  Glucose checks as needed.  Exercise Prescription Changes:   Exercise Prescription Changes     Row Name 04/03/24 1500 04/17/24 1500 04/26/24 1509         Response to Exercise   Blood Pressure (Admit) 116/60 140/58 116/60     Blood Pressure (Exercise) 148/60 140/60 --     Blood Pressure (Exit) 120/40 108/68 158/58     Heart Rate (Admit) 64 bpm 73 bpm 76 bpm     Heart Rate (Exercise) 85 bpm 98 bpm 96 bpm     Heart Rate (Exit) 76 bpm 75 bpm 76 bpm     Oxygen Saturation (Admit) 98 % 99 % 96 %     Oxygen Saturation (Exercise) 100 % 95 % 99 %     Oxygen Saturation (Exit) 99 % 99 % 99 %     Rating of Perceived Exertion (Exercise) 13 13 13      Perceived Dyspnea (Exercise) 0 1 1     Duration Continue with 30 min of aerobic exercise without signs/symptoms of physical distress. Continue with 30 min of aerobic exercise without signs/symptoms of physical distress. Continue with 30 min of aerobic exercise without signs/symptoms of physical distress.     Intensity THRR unchanged THRR unchanged THRR unchanged       Resistance Training   Training Prescription Yes Yes Yes     Weight red bands red bands red bands     Reps 10-15 10-15 10-15     Time 10 Minutes 10 Minutes 10 Minutes       NuStep   Level -- 2 2     SPM -- 85 83     Minutes -- 15 15     METs -- 2.7 2.2       Track   Laps 23 12 14       Minutes 30 15 15      METs 2.44 2.73 2.75        Exercise Comments:   Exercise Goals and Review:   Exercise Goals     Row Name 03/23/24 1335             Exercise Goals   Increase Physical Activity Yes       Intervention Provide advice, education, support and counseling about physical activity/exercise needs.;Develop an individualized exercise prescription for aerobic and resistive training based on initial evaluation findings, risk stratification, comorbidities and participant's personal goals.       Expected Outcomes Short Term: Attend rehab on a regular basis to increase amount of physical activity.;Long Term: Add in home exercise to make exercise part of routine and to increase amount of physical activity.;Long Term: Exercising regularly at least 3-5 days a week.       Increase Strength and Stamina Yes       Intervention Provide advice, education, support and counseling about physical activity/exercise needs.;Develop an individualized exercise prescription for aerobic and resistive training based on initial evaluation findings, risk stratification, comorbidities and participant's personal goals.       Expected Outcomes Short Term: Increase workloads from initial exercise prescription for resistance, speed, and METs.;Short Term: Perform resistance training exercises routinely during rehab and add in resistance training at home;Long Term: Improve cardiorespiratory fitness, muscular endurance and strength as measured by increased METs and functional capacity ( )       Able to understand and use rate of perceived exertion (RPE) scale Yes       Intervention Provide education and explanation on how to use RPE scale  Expected Outcomes Short Term: Able to use RPE daily in rehab to express subjective intensity level;Long Term:  Able to use RPE to guide intensity level when exercising independently       Able to understand and use Dyspnea scale Yes       Intervention Provide education  and explanation on how to use Dyspnea scale       Expected Outcomes Short Term: Able to use Dyspnea scale daily in rehab to express subjective sense of shortness of breath during exertion;Long Term: Able to use Dyspnea scale to guide intensity level when exercising independently       Knowledge and understanding of Target Heart Rate Range (THRR) Yes       Intervention Provide education and explanation of THRR including how the numbers were predicted and where they are located for reference       Expected Outcomes Short Term: Able to state/look up THRR;Long Term: Able to use THRR to govern intensity when exercising independently;Short Term: Able to use daily as guideline for intensity in rehab       Understanding of Exercise Prescription Yes       Intervention Provide education, explanation, and written materials on patient's individual exercise prescription       Expected Outcomes Short Term: Able to explain program exercise prescription;Long Term: Able to explain home exercise prescription to exercise independently          Exercise Goals Re-Evaluation :  Exercise Goals Re-Evaluation     Row Name 03/28/24 1453 04/26/24 0941           Exercise Goal Re-Evaluation   Exercise Goals Review Increase Physical Activity;Able to understand and use Dyspnea scale;Understanding of Exercise Prescription;Increase Strength and Stamina;Knowledge and understanding of Target Heart Rate Range (THRR);Able to understand and use rate of perceived exertion (RPE) scale Increase Physical Activity;Able to understand and use Dyspnea scale;Understanding of Exercise Prescription;Increase Strength and Stamina;Knowledge and understanding of Target Heart Rate Range (THRR);Able to understand and use rate of perceived exertion (RPE) scale      Comments Pt to begin exercise 03/29/24. Will progress as tolerated. Pt has completed 6 exercise sessions. She is walking the track for 15 min, 2.25 METs. She has claudication symptoms and  needs to rest x3 in 15 min. She then exercises on the recumbent stepper for 15 min, level 2, METs 2.9. She is performing warm up and cool down standing, using red bands, 3.7 lbs. Will progress as tolerated.      Expected Outcomes Through exercise at rehab and home, the patient will decrease shortness of breath with daily activities and feel confident in carrying out an exercise regimen at home. Through exercise at rehab and home, the patient will decrease shortness of breath with daily activities and feel confident in carrying out an exercise regimen at home.         Discharge Exercise Prescription (Final Exercise Prescription Changes):  Exercise Prescription Changes - 04/26/24 1509       Response to Exercise   Blood Pressure (Admit) 116/60    Blood Pressure (Exit) 158/58    Heart Rate (Admit) 76 bpm    Heart Rate (Exercise) 96 bpm    Heart Rate (Exit) 76 bpm    Oxygen Saturation (Admit) 96 %    Oxygen Saturation (Exercise) 99 %    Oxygen Saturation (Exit) 99 %    Rating of Perceived Exertion (Exercise) 13    Perceived Dyspnea (Exercise) 1    Duration Continue with 30 min  of aerobic exercise without signs/symptoms of physical distress.    Intensity THRR unchanged      Resistance Training   Training Prescription Yes    Weight red bands    Reps 10-15    Time 10 Minutes      NuStep   Level 2    SPM 83    Minutes 15    METs 2.2      Track   Laps 14    Minutes 15    METs 2.75          Nutrition:  Target Goals: Understanding of nutrition guidelines, daily intake of sodium 1500mg , cholesterol 200mg , calories 30% from fat and 7% or less from saturated fats, daily to have 5 or more servings of fruits and vegetables.  Biometrics:    Nutrition Therapy Plan and Nutrition Goals:   Nutrition Assessments:  MEDIFICTS Score Key: >=70 Need to make dietary changes  40-70 Heart Healthy Diet <= 40 Therapeutic Level Cholesterol Diet   Picture Your Plate Scores: <59  Unhealthy dietary pattern with much room for improvement. 41-50 Dietary pattern unlikely to meet recommendations for good health and room for improvement. 51-60 More healthful dietary pattern, with some room for improvement.  >60 Healthy dietary pattern, although there may be some specific behaviors that could be improved.    Nutrition Goals Re-Evaluation:   Nutrition Goals Discharge (Final Nutrition Goals Re-Evaluation):   Psychosocial: Target Goals: Acknowledge presence or absence of significant depression and/or stress, maximize coping skills, provide positive support system. Participant is able to verbalize types and ability to use techniques and skills needed for reducing stress and depression.  Initial Review & Psychosocial Screening:  Initial Psych Review & Screening - 03/23/24 1326       Initial Review   Current issues with None Identified      Family Dynamics   Good Support System? Yes    Comments 1 son and 1 daughter      Barriers   Psychosocial barriers to participate in program There are no identifiable barriers or psychosocial needs.      Screening Interventions   Interventions Encouraged to exercise          Quality of Life Scores:  Scores of 19 and below usually indicate a poorer quality of life in these areas.  A difference of  2-3 points is a clinically meaningful difference.  A difference of 2-3 points in the total score of the Quality of Life Index has been associated with significant improvement in overall quality of life, self-image, physical symptoms, and general health in studies assessing change in quality of life.  PHQ-9: Review Flowsheet  More data exists      03/23/2024 09/08/2017 08/19/2016 05/17/2016 06/19/2015  Depression screen PHQ 2/9  Decreased Interest 0 0 0 0 0  Down, Depressed, Hopeless 0 0 0 0 0  PHQ - 2 Score 0 0 0 0 0  Altered sleeping 0 - - - -  Tired, decreased energy 0 - - - -  Change in appetite 0 - - - -  Feeling bad or  failure about yourself  0 - - - -  Trouble concentrating 0 - - - -  Moving slowly or fidgety/restless 0 - - - -  Suicidal thoughts 0 - - - -  PHQ-9 Score 0  - - - -  Difficult doing work/chores Not difficult at all - - - -    Details       Data saved with  a previous flowsheet row definition        Interpretation of Total Score  Total Score Depression Severity:  1-4 = Minimal depression, 5-9 = Mild depression, 10-14 = Moderate depression, 15-19 = Moderately severe depression, 20-27 = Severe depression   Psychosocial Evaluation and Intervention:  Psychosocial Evaluation - 03/23/24 1328       Psychosocial Evaluation & Interventions   Interventions Encouraged to exercise with the program and follow exercise prescription    Comments Ashley Mayer denies any psychosocial barriers at this time.    Expected Outcomes For Ashley Mayer to participate in rehab free of concerns.    Continue Psychosocial Services  No Follow up required          Psychosocial Re-Evaluation:  Psychosocial Re-Evaluation     Row Name 03/30/24 1404 04/23/24 1137           Psychosocial Re-Evaluation   Current issues with None Identified None Identified      Comments Monthly psychosocial re-evaluation as follows: No changes since orientation. Ashley Mayer has completed 1 session so far. At the time of orientation, she denied any resources, needs or referrals for her psy/soc health. Monthly psychosocial re-evaluation as follows: No changes since last assessment. Ashley Mayer continues to deny any resources, needs or referrals for her psy/soc health.      Expected Outcomes For Ashley Mayer to continue to participate in rehab free of concerns or barriers. For Pat to continue to participate in rehab free of concerns or barriers.      Interventions Encouraged to attend Pulmonary Rehabilitation for the exercise Encouraged to attend Pulmonary Rehabilitation for the exercise      Continue Psychosocial Services  No Follow up required No Follow up required          Psychosocial Discharge (Final Psychosocial Re-Evaluation):  Psychosocial Re-Evaluation - 04/23/24 1137       Psychosocial Re-Evaluation   Current issues with None Identified    Comments Monthly psychosocial re-evaluation as follows: No changes since last assessment. Ashley Mayer continues to deny any resources, needs or referrals for her psy/soc health.    Expected Outcomes For Ashley Mayer to continue to participate in rehab free of concerns or barriers.    Interventions Encouraged to attend Pulmonary Rehabilitation for the exercise    Continue Psychosocial Services  No Follow up required          Education: Education Goals: Education classes will be provided on a weekly basis, covering required topics. Participant will state understanding/return demonstration of topics presented.  Learning Barriers/Preferences:  Learning Barriers/Preferences - 03/23/24 1402       Learning Barriers/Preferences   Learning Barriers None    Learning Preferences None          Education Topics: [employer] instruction that is supported by a PowerPoint presentation. Instructor discusses importance of knowing and understanding resting, exercise, and post-exercise oxygen saturation, heart rate, and blood pressure. Oxygen saturation, heart rate, blood pressure, rating of perceived exertion, and dyspnea are reviewed along with a normal range for these values.    Exercise for the Pulmonary Patient Group instruction that is supported by a PowerPoint presentation. Instructor discusses benefits of exercise, core components of exercise, frequency, duration, and intensity of an exercise routine, importance of utilizing pulse oximetry during exercise, safety while exercising, and options of places to exercise outside of rehab.    MET Level  Group instruction provided by PowerPoint, verbal discussion, and written material to support subject matter. Instructor reviews what METs are and how to  increase METs.   Flowsheet Row PULMONARY REHAB OTHER RESPIRATORY from 04/12/2024 in [facility] for Heart, Vascular, & Lung Health  Date 04/12/24  Educator EP  Instruction Review Code 1- Verbalizes Understanding    Pulmonary Medications Verbally interactive group education provided by instructor with focus on inhaled medications and proper administration.   Anatomy and Physiology of the Respiratory System Group instruction provided by PowerPoint, verbal discussion, and written material to support subject matter. Instructor reviews respiratory cycle and anatomical components of the respiratory system and their functions. Instructor also reviews differences in obstructive and restrictive respiratory diseases with examples of each.  Flowsheet Row PULMONARY REHAB OTHER RESPIRATORY from 04/26/2024 in [facility] for Heart, Vascular, & Lung Health  Date 04/26/24  Educator RT  Instruction Review Code 1- Verbalizes Understanding    Oxygen Safety Group instruction provided by PowerPoint, verbal discussion, and written material to support subject matter. There is an overview of "What is Oxygen" and "Why do we need it".  Instructor also reviews how to create a safe environment for oxygen use, the importance of using oxygen as prescribed, and the risks of noncompliance. There is a brief discussion on traveling with oxygen and resources the patient may utilize.   Oxygen Use Group instruction provided by PowerPoint, verbal discussion, and written material to discuss how supplemental oxygen is prescribed and different types of oxygen supply systems. Resources for more information are provided.    Breathing Techniques Group instruction that is supported by demonstration and informational handouts. Instructor discusses the benefits of pursed lip and diaphragmatic breathing and detailed demonstration on how to perform both.     Risk Factor Reduction Group instruction that  is supported by a PowerPoint presentation. Instructor discusses the definition of a risk factor, different risk factors for pulmonary disease, and how the heart and lungs work together.   Pulmonary Diseases Group instruction provided by PowerPoint, verbal discussion, and written material to support subject matter. Instructor gives an overview of the different type of pulmonary diseases. There is also a discussion on risk factors and symptoms as well as ways to manage the diseases. Flowsheet Row PULMONARY REHAB OTHER RESPIRATORY from 04/19/2024 in [facility] for Heart, Vascular, & Lung Health  Date 04/19/24  Educator RT  Instruction Review Code 1- Verbalizes Understanding    Stress and Energy Conservation Group instruction provided by PowerPoint, verbal discussion, and written material to support subject matter. Instructor gives an overview of stress and the impact it can have on the body. Instructor also reviews ways to reduce stress. There is also a discussion on energy conservation and ways to conserve energy throughout the day.   Warning Signs and Symptoms Group instruction provided by PowerPoint, verbal discussion, and written material to support subject matter. Instructor reviews warning signs and symptoms of stroke, heart attack, cold and flu. Instructor also reviews ways to prevent the spread of infection. Flowsheet Row PULMONARY REHAB OTHER RESPIRATORY from 03/29/2024 in [facility] for Heart, Vascular, & Lung Health  Date 03/29/24  Educator RN  Instruction Review Code 1- Verbalizes Understanding    Other Education Group or individual verbal, written, or video instructions that support the educational goals of the pulmonary rehab program.    Knowledge Questionnaire Score:  Knowledge Questionnaire Score - 03/23/24 1402       Knowledge Questionnaire Score   Pre Score 14/18          Core Components/Risk Factors/Patient Goals  at Admission:  Personal Goals and Risk Factors at Admission - 03/23/24 1330       Core Components/Risk Factors/Patient Goals on Admission   Improve shortness of breath with ADL's Yes    Intervention Provide education, individualized exercise plan and daily activity instruction to help decrease symptoms of SOB with activities of daily living.    Expected Outcomes Short Term: Improve cardiorespiratory fitness to achieve a reduction of symptoms when performing ADLs;Long Term: Be able to perform more ADLs without symptoms or delay the onset of symptoms          Core Components/Risk Factors/Patient Goals Review:   Goals and Risk Factor Review     Row Name 03/30/24 1405 04/23/24 1138           Core Components/Risk Factors/Patient Goals Review   Personal Goals Review Develop more efficient breathing techniques such as purse lipped breathing and diaphragmatic breathing and practicing self-pacing with activity.;Improve shortness of breath with ADL's Develop more efficient breathing techniques such as purse lipped breathing and diaphragmatic breathing and practicing self-pacing with activity.;Improve shortness of breath with ADL's      Review Monthly review of patient's Core Components/Risk Factors/Patient Goals are as follows: Unable to assess goals yet. Ashley Mayer has completed 1 session so far. Ashley Mayer will continue to benefit from the PR program for exercise and education. Monthly review of patient's Core Components/Risk Factors/Patient Goals are as follows:  Goal in progress for improving her shortness of breath with ADLs. Ashley Mayer is trying to build up her strength and endurance. She has completed 5 sessions so far and is exercising on the Nu[employer] and walking the track. Her oxygen saturation has been stable on room air. Goal progressing for developing more efficient breathing techniques such as purse lipped breathing and diaphragmatic breathing; and practicing self-pacing with activity. Ashley Mayer needs to be prompted to  perform purse lipped breathing while short of breath. We are working on this with her while performing the warmup and while exercising. She is working on diaphragmatic breathing at home. Ashley Mayer will continue to benefit from PR for nutrition, education, exercise, and lifestyle modification.      Expected Outcomes Pt will show progress toward meeting expected goals and outcomes. Pt will show progress toward meeting expected goals and outcomes.         Core Components/Risk Factors/Patient Goals at Discharge (Final Review):   Goals and Risk Factor Review - 04/23/24 1138       Core Components/Risk Factors/Patient Goals Review   Personal Goals Review Develop more efficient breathing techniques such as purse lipped breathing and diaphragmatic breathing and practicing self-pacing with activity.;Improve shortness of breath with ADL's    Review Monthly review of patient's Core Components/Risk Factors/Patient Goals are as follows:  Goal in progress for improving her shortness of breath with ADLs. Ashley Mayer is trying to build up her strength and endurance. She has completed 5 sessions so far and is exercising on the NuStep and walking the track. Her oxygen saturation has been stable on room air. Goal progressing for developing more efficient breathing techniques such as purse lipped breathing and diaphragmatic breathing; and practicing self-pacing with activity. Ashley Mayer needs to be prompted to perform purse lipped breathing while short of breath. We are working on this with her while performing the warmup and while exercising. She is working on diaphragmatic breathing at home. Ashley Mayer will continue to benefit from PR for nutrition, education, exercise, and lifestyle modification.    Expected Outcomes Pt will show progress toward meeting expected  goals and outcomes.          ITP Comments: Pt is making expected progress toward Pulmonary Rehab goals after completing 7 session(s). Recommend continued exercise, life style  modification, education, and utilization of breathing techniques to increase stamina and strength, while also decreasing shortness of breath with exertion.  Dr. Slater Staff is Medical Director for Pulmonary Rehab at [facility].

## 2024-05-07 ENCOUNTER — Ambulatory Visit (INDEPENDENT_AMBULATORY_CARE_PROVIDER_SITE_OTHER)

## 2024-05-07 DIAGNOSIS — I739 Peripheral vascular disease, unspecified: Secondary | ICD-10-CM | POA: Diagnosis not present

## 2024-05-07 LAB — VAS US ABI WITH/WO TBI
Left ABI: 0.64
Right ABI: 0.69

## 2024-05-08 ENCOUNTER — Encounter (HOSPITAL_COMMUNITY)
Admission: RE | Admit: 2024-05-08 | Discharge: 2024-05-08 | Disposition: A | Source: Ambulatory Visit | Attending: Pulmonary Disease

## 2024-05-08 VITALS — Wt 124.8 lb

## 2024-05-08 DIAGNOSIS — I499 Cardiac arrhythmia, unspecified: Secondary | ICD-10-CM | POA: Insufficient documentation

## 2024-05-08 DIAGNOSIS — J432 Centrilobular emphysema: Secondary | ICD-10-CM | POA: Insufficient documentation

## 2024-05-08 NOTE — Progress Notes (Signed)
 Daily Session Note  Patient Details  Name: Ashley Mayer MRN: [id number] Date of Birth: 09-05-1940 Referring Provider:   [name] Pulmonary Rehab Walk Test from 03/23/2024 in [facility] for Heart, Vascular, & Lung Health  Referring Provider [name]    Encounter Date: 05/08/2024  Check In:  Session Check In - 05/08/24 1323       Check-In   Supervising physician immediately available to respond to emergencies [facility] MD immediately available    Physician(s) Damien Braver, NP    Location [facility]    Staff Present Ronal Levin, RN, Maud Moats, MS, ACSM-CEP, Exercise Physiologist;Nahsir Venezia Claudene Idelia Aris BS, ACSM-CEP, Exercise Physiologist    Virtual Visit No    Medication changes reported     No    Fall or balance concerns reported    No    Tobacco Cessation No Change    Warm-up and Cool-down Performed as group-led instruction    Resistance Training Performed Yes    VAD Patient? No    PAD/SET Patient? No      Pain Assessment   Currently in Pain? No/denies    Multiple Pain Sites No          Capillary Blood Glucose: No results found for this or any previous visit (from the past 24 hours).    Social History   Tobacco Use  Smoking Status Never  Smokeless Tobacco Never    Goals Met:  Proper associated with RPD/PD & O2 Sat Independence with exercise equipment Exercise tolerated well No report of concerns or symptoms today Strength training completed today  Goals Unmet:  Not Applicable  Comments: Service time is from 1305 to 1435.    Dr. Slater [name] is Medical Director for Pulmonary Rehab at [facility].

## 2024-05-10 ENCOUNTER — Encounter (HOSPITAL_COMMUNITY): Admission: RE | Admit: 2024-05-10 | Source: Ambulatory Visit

## 2024-05-10 ENCOUNTER — Telehealth (HOSPITAL_COMMUNITY): Payer: Self-pay

## 2024-05-10 NOTE — Telephone Encounter (Signed)
 Patient c/o for 1:15 PR class, no reason given.

## 2024-05-15 ENCOUNTER — Encounter (HOSPITAL_COMMUNITY)
Admission: RE | Admit: 2024-05-15 | Discharge: 2024-05-15 | Disposition: A | Source: Ambulatory Visit | Attending: Pulmonary Disease

## 2024-05-15 VITALS — Wt 126.5 lb

## 2024-05-15 DIAGNOSIS — J432 Centrilobular emphysema: Secondary | ICD-10-CM | POA: Diagnosis not present

## 2024-05-15 DIAGNOSIS — I499 Cardiac arrhythmia, unspecified: Secondary | ICD-10-CM | POA: Diagnosis not present

## 2024-05-15 NOTE — Progress Notes (Signed)
 Incomplete Session Note  Patient Details  Name: Ashley Mayer MRN: [id number] Date of Birth: 1941-02-12 Referring Provider:   [name] Pulmonary Rehab Walk Test from 03/23/2024 in [facility] for Heart, Vascular, & Lung Health  Referring Provider Arelly Whittenberg A Keay did not complete her rehab session. Staff noted HR to be in the 130s. Usually HR for pt in the 70s. Pt denies any s/s. Pt placed on zoll monitor, noted to possibly be in uncontrolled Afib. Spoke to onsite provider, Rosaline Bane NP from cardiology, 12 lead ordered. Confirmed Afib on EKG. Cards appointment made for pt for tomorrow.

## 2024-05-15 NOTE — Progress Notes (Unsigned)
  Cardiology Office Note   Date:  05/15/2024  ID:  Ashley Mayer, DOB 1940-12-22, MRN [id number] PCP: Rolinda Millman, MD  [facility] Providers Cardiologist:  Shelda Bruckner, MD { Click to update primary MD,subspecialty MD or APP then REFRESH:1}    PMH Hypertension Hyperlipidemia Emphysema/bronchiectasis  Seen remotely for chest pain and DOE, she established care with Dr. Bruckner on 04/23/2024.  Echo 2023 with EF 50 to 55%, normal RV, mild to moderate MR, aortic sclerosis without stenosis.  Calcium  score in 2023 was 573 (70th percentile).  She is followed by pulmonology for emphysema/bronchiectasis.  Strong family history of CAD.  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 Catherine at [facility].  She sometimes has sharp shooting pain from her toes to her calves both at rest and with exertion intermittently.  She walks a lot and denies claudication but does note cramping that improves with rest.  She was at pulmonary rehab on 05/15/2024 when she was found to be in A-fib RVR with heart rate in the 130s to 140s.  She denied lightheadedness, fatigue, or shortness of breath.  She noted a recent episode of weakness that resolved quickly.  She was unaware of palpitations or tachycardia.  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  CHA2DS2-VASc score is 4 (?5) based on vascular disease, sex and age.  History of hypertension is reported but she is not currently on antihypertensive therapy.  History of Present Illness Ashley Mayer is a 83 y.o. female ***  ROS: ***  Studies Reviewed      ***  No results found for: LIPOA  Risk Assessment/Calculations  CHA2DS2-VASc Score = 4  {Click here to calculate score.  REFRESH note before signing. :1} This indicates a 4.8% annual risk of stroke. The patient's score is based upon: CHF History: 0 HTN History: 0 Diabetes History: 0 Stroke History: 0 Vascular Disease  History: 1 Age Score: 2 Gender Score: 1   {This patient has a significant risk of stroke if diagnosed with atrial fibrillation.  Please consider VKA or DOAC agent for anticoagulation if the bleeding risk is acceptable.   You can also use the SmartPhrase .[email or web address] for documentation.   :[id number]} No BP recorded.  {Refresh Note OR Click here to enter BP  :1}***       Physical Exam VS:  There were no vitals taken for this visit.   Wt Readings from Last 3 Encounters:  05/15/24 126 lb 8.7 oz (57.4 kg)  04/26/24 125 lb 10.6 oz (57 kg)  04/23/24 125 lb (56.7 kg)    GEN: Well nourished, well developed in no acute distress NECK: No JVD; No carotid bruits CARDIAC: ***RRR, no murmurs, rubs, gallops RESPIRATORY:  Clear to auscultation without rales, wheezing or rhonchi  ABDOMEN: Soft, non-tender, non-distended EXTREMITIES:  No edema; No deformity   ASSESSMENT AND PLAN ***    {Are you ordering a CV Procedure (e.g. stress test, cath, DCCV, TEE, etc)?   Press F2        :[id number]}  Dispo: ***  Signed, Rosaline Bane, NP-C

## 2024-05-16 ENCOUNTER — Other Ambulatory Visit (HOSPITAL_BASED_OUTPATIENT_CLINIC_OR_DEPARTMENT_OTHER): Payer: Self-pay

## 2024-05-16 ENCOUNTER — Ambulatory Visit (INDEPENDENT_AMBULATORY_CARE_PROVIDER_SITE_OTHER): Admitting: Nurse Practitioner

## 2024-05-16 ENCOUNTER — Encounter (HOSPITAL_BASED_OUTPATIENT_CLINIC_OR_DEPARTMENT_OTHER): Payer: Self-pay | Admitting: Nurse Practitioner

## 2024-05-16 VITALS — BP 114/68 | HR 88 | Ht 60.0 in | Wt 125.0 lb

## 2024-05-16 DIAGNOSIS — K900 Celiac disease: Secondary | ICD-10-CM | POA: Diagnosis not present

## 2024-05-16 DIAGNOSIS — E785 Hyperlipidemia, unspecified: Secondary | ICD-10-CM | POA: Diagnosis not present

## 2024-05-16 DIAGNOSIS — I4891 Unspecified atrial fibrillation: Secondary | ICD-10-CM

## 2024-05-16 DIAGNOSIS — I700 Atherosclerosis of aorta: Secondary | ICD-10-CM | POA: Diagnosis not present

## 2024-05-16 DIAGNOSIS — I480 Paroxysmal atrial fibrillation: Secondary | ICD-10-CM | POA: Diagnosis not present

## 2024-05-16 DIAGNOSIS — I340 Nonrheumatic mitral (valve) insufficiency: Secondary | ICD-10-CM

## 2024-05-16 DIAGNOSIS — I2510 Atherosclerotic heart disease of native coronary artery without angina pectoris: Secondary | ICD-10-CM | POA: Diagnosis not present

## 2024-05-16 DIAGNOSIS — J069 Acute upper respiratory infection, unspecified: Secondary | ICD-10-CM

## 2024-05-16 MED ORDER — APIXABAN 2.5 MG PO TABS
2.5000 mg | ORAL_TABLET | Freq: Two times a day (BID) | ORAL | 11 refills | Status: AC
  Filled 2024-05-16: qty 60, 30d supply, fill #0
  Filled 2024-06-21: qty 60, 30d supply, fill #1

## 2024-05-16 MED ORDER — METOPROLOL TARTRATE 25 MG PO TABS
25.0000 mg | ORAL_TABLET | Freq: Two times a day (BID) | ORAL | 3 refills | Status: DC
  Filled 2024-05-16: qty 180, 90d supply, fill #0

## 2024-05-16 NOTE — Patient Instructions (Signed)
 Medication Instructions:   START Lopressor  one (1) tablet by mouth ( 25 mg) twice daily.   START Eliquis  one (1) tablet by mouth ( 2.5 mg) twice daily.   *If you need a refill on your cardiac medications before your next appointment, please call your pharmacy*  Lab Work:  Your physician recommends that you return for lab work at your convenience this week.    If you have labs (blood work) drawn today and your tests are completely normal, you will receive your results only by: MyChart Message (if you have MyChart) OR A paper copy in the mail If you have any lab test that is abnormal or we need to change your treatment, we will call you to review the results.  Testing/Procedures:  None ordered.   Follow-Up: At [facility], you and your health needs are our priority.  As part of our continuing mission to provide you with exceptional heart care, our providers are all part of one team.  This team includes your primary Cardiologist (physician) and Advanced Practice Providers or APPs (Physician Assistants and Nurse Practitioners) who all work together to provide you with the care you need, when you need it.  Your next appointment:   3 month(s)  Provider:   Shelda Bruckner, MD    We recommend signing up for the patient portal called MyChart.  Sign up information is provided on this After Visit Summary.  MyChart is used to connect with patients for Virtual Visits (Telemedicine).  Patients are able to view lab/test results, encounter notes, upcoming appointments, etc.  Non-urgent messages can be sent to your provider as well.   To learn more about what you can do with MyChart, go to [email or web address].   Other Instructions  You have been referred to A-fib clinic.

## 2024-05-17 ENCOUNTER — Other Ambulatory Visit: Payer: Self-pay

## 2024-05-17 ENCOUNTER — Observation Stay (HOSPITAL_BASED_OUTPATIENT_CLINIC_OR_DEPARTMENT_OTHER)
Admission: EM | Admit: 2024-05-17 | Discharge: 2024-05-22 | Disposition: A | Discharge status: Home / Self Care | Attending: Internal Medicine | Admitting: Internal Medicine

## 2024-05-17 ENCOUNTER — Observation Stay (HOSPITAL_COMMUNITY)

## 2024-05-17 ENCOUNTER — Encounter (HOSPITAL_COMMUNITY)

## 2024-05-17 ENCOUNTER — Emergency Department (HOSPITAL_BASED_OUTPATIENT_CLINIC_OR_DEPARTMENT_OTHER)

## 2024-05-17 ENCOUNTER — Encounter (HOSPITAL_BASED_OUTPATIENT_CLINIC_OR_DEPARTMENT_OTHER): Payer: Self-pay | Admitting: Emergency Medicine

## 2024-05-17 DIAGNOSIS — I4819 Other persistent atrial fibrillation: Secondary | ICD-10-CM | POA: Insufficient documentation

## 2024-05-17 DIAGNOSIS — Z7901 Long term (current) use of anticoagulants: Secondary | ICD-10-CM | POA: Diagnosis not present

## 2024-05-17 DIAGNOSIS — I130 Hypertensive heart and chronic kidney disease with heart failure and stage 1 through stage 4 chronic kidney disease, or unspecified chronic kidney disease: Secondary | ICD-10-CM | POA: Diagnosis not present

## 2024-05-17 DIAGNOSIS — E785 Hyperlipidemia, unspecified: Secondary | ICD-10-CM | POA: Diagnosis not present

## 2024-05-17 DIAGNOSIS — F4321 Adjustment disorder with depressed mood: Secondary | ICD-10-CM | POA: Diagnosis not present

## 2024-05-17 DIAGNOSIS — I129 Hypertensive chronic kidney disease with stage 1 through stage 4 chronic kidney disease, or unspecified chronic kidney disease: Secondary | ICD-10-CM | POA: Diagnosis not present

## 2024-05-17 DIAGNOSIS — R42 Dizziness and giddiness: Secondary | ICD-10-CM

## 2024-05-17 DIAGNOSIS — N1831 Chronic kidney disease, stage 3a: Secondary | ICD-10-CM | POA: Insufficient documentation

## 2024-05-17 DIAGNOSIS — E739 Lactose intolerance, unspecified: Secondary | ICD-10-CM | POA: Diagnosis not present

## 2024-05-17 DIAGNOSIS — R0602 Shortness of breath: Secondary | ICD-10-CM | POA: Insufficient documentation

## 2024-05-17 DIAGNOSIS — J479 Bronchiectasis, uncomplicated: Secondary | ICD-10-CM | POA: Diagnosis not present

## 2024-05-17 DIAGNOSIS — G459 Transient cerebral ischemic attack, unspecified: Secondary | ICD-10-CM

## 2024-05-17 DIAGNOSIS — Z743 Need for continuous supervision: Secondary | ICD-10-CM | POA: Diagnosis not present

## 2024-05-17 DIAGNOSIS — R531 Weakness: Secondary | ICD-10-CM | POA: Diagnosis not present

## 2024-05-17 DIAGNOSIS — J449 Chronic obstructive pulmonary disease, unspecified: Secondary | ICD-10-CM | POA: Diagnosis not present

## 2024-05-17 DIAGNOSIS — Z79899 Other long term (current) drug therapy: Secondary | ICD-10-CM | POA: Insufficient documentation

## 2024-05-17 DIAGNOSIS — I10 Essential (primary) hypertension: Secondary | ICD-10-CM | POA: Diagnosis not present

## 2024-05-17 DIAGNOSIS — I480 Paroxysmal atrial fibrillation: Secondary | ICD-10-CM | POA: Diagnosis not present

## 2024-05-17 DIAGNOSIS — I739 Peripheral vascular disease, unspecified: Secondary | ICD-10-CM | POA: Diagnosis not present

## 2024-05-17 DIAGNOSIS — I4891 Unspecified atrial fibrillation: Secondary | ICD-10-CM | POA: Diagnosis not present

## 2024-05-17 DIAGNOSIS — I6782 Cerebral ischemia: Secondary | ICD-10-CM | POA: Diagnosis not present

## 2024-05-17 DIAGNOSIS — R519 Headache, unspecified: Secondary | ICD-10-CM | POA: Diagnosis not present

## 2024-05-17 DIAGNOSIS — E782 Mixed hyperlipidemia: Secondary | ICD-10-CM | POA: Diagnosis present

## 2024-05-17 DIAGNOSIS — J439 Emphysema, unspecified: Secondary | ICD-10-CM | POA: Diagnosis present

## 2024-05-17 LAB — URINALYSIS, ROUTINE W REFLEX MICROSCOPIC
Bilirubin Urine: NEGATIVE
Glucose, UA: NEGATIVE mg/dL
Hgb urine dipstick: NEGATIVE
Ketones, ur: NEGATIVE mg/dL
Leukocytes,Ua: NEGATIVE
Nitrite: NEGATIVE
Protein, ur: NEGATIVE mg/dL
Specific Gravity, Urine: 1.007 (ref 1.005–1.030)
pH: 7 (ref 5.0–8.0)

## 2024-05-17 LAB — CBC
HCT: 35.3 % — ABNORMAL LOW (ref 36.0–46.0)
Hemoglobin: 11.7 g/dL — ABNORMAL LOW (ref 12.0–15.0)
MCH: 32.5 pg (ref 26.0–34.0)
MCHC: 33.1 g/dL (ref 30.0–36.0)
MCV: 98.1 fL (ref 80.0–100.0)
Platelets: 302 K/uL (ref 150–400)
RBC: 3.6 MIL/uL — ABNORMAL LOW (ref 3.87–5.11)
RDW: 13.3 % (ref 11.5–15.5)
WBC: 7.6 K/uL (ref 4.0–10.5)
nRBC: 0 % (ref 0.0–0.2)

## 2024-05-17 LAB — COMPREHENSIVE METABOLIC PANEL WITH GFR
ALT: 27 U/L (ref 0–44)
AST: 33 U/L (ref 15–41)
Albumin: 4.5 g/dL (ref 3.5–5.0)
Alkaline Phosphatase: 70 U/L (ref 38–126)
Anion gap: 13 (ref 5–15)
BUN: 18 mg/dL (ref 8–23)
CO2: 24 mmol/L (ref 22–32)
Calcium: 10.2 mg/dL (ref 8.9–10.3)
Chloride: 97 mmol/L — ABNORMAL LOW (ref 98–111)
Creatinine, Ser: 1.26 mg/dL — ABNORMAL HIGH (ref 0.44–1.00)
GFR, Estimated: 42 mL/min — ABNORMAL LOW (ref >60)
Glucose, Bld: 110 mg/dL — ABNORMAL HIGH (ref 70–99)
Potassium: 5 mmol/L (ref 3.5–5.1)
Sodium: 134 mmol/L — ABNORMAL LOW (ref 135–145)
Total Bilirubin: 0.6 mg/dL (ref 0.0–1.2)
Total Protein: 7.3 g/dL (ref 6.5–8.1)

## 2024-05-17 LAB — DIFFERENTIAL
Abs Immature Granulocytes: 0.02 K/uL (ref 0.00–0.07)
Basophils Absolute: 0 K/uL (ref 0.0–0.1)
Basophils Relative: 1 %
Eosinophils Absolute: 0.2 K/uL (ref 0.0–0.5)
Eosinophils Relative: 3 %
Immature Granulocytes: 0 %
Lymphocytes Relative: 13 %
Lymphs Abs: 1 K/uL (ref 0.7–4.0)
Monocytes Absolute: 0.5 K/uL (ref 0.1–1.0)
Monocytes Relative: 7 %
Neutro Abs: 5.8 K/uL (ref 1.7–7.7)
Neutrophils Relative %: 76 %

## 2024-05-17 LAB — TROPONIN T, HIGH SENSITIVITY: Troponin T High Sensitivity: 16 ng/L (ref 0–19)

## 2024-05-17 LAB — URINE DRUG SCREEN
Amphetamines: NEGATIVE
Barbiturates: NEGATIVE
Benzodiazepines: NEGATIVE
Cocaine: NEGATIVE
Fentanyl: NEGATIVE
Methadone Scn, Ur: NEGATIVE
Opiates: NEGATIVE
Tetrahydrocannabinol: NEGATIVE

## 2024-05-17 LAB — RESP PANEL BY RT-PCR (RSV, FLU A&B, COVID)  RVPGX2
Influenza A by PCR: NEGATIVE
Influenza B by PCR: NEGATIVE
Resp Syncytial Virus by PCR: NEGATIVE
SARS Coronavirus 2 by RT PCR: NEGATIVE

## 2024-05-17 LAB — PROTIME-INR
INR: 1.2 (ref 0.8–1.2)
Prothrombin Time: 15.9 s — ABNORMAL HIGH (ref 11.4–15.2)

## 2024-05-17 LAB — MAGNESIUM: Magnesium: 2.1 mg/dL (ref 1.7–2.4)

## 2024-05-17 LAB — CBG MONITORING, ED: Glucose-Capillary: 95 mg/dL (ref 70–99)

## 2024-05-17 MED ORDER — DILTIAZEM LOAD VIA INFUSION
20.0000 mg | Freq: Once | INTRAVENOUS | Status: AC
  Administered 2024-05-17: 20 mg via INTRAVENOUS
  Filled 2024-05-17: qty 20

## 2024-05-17 MED ORDER — DILTIAZEM HCL-DEXTROSE 125-5 MG/125ML-% IV SOLN (PREMIX)
5.0000 mg/h | INTRAVENOUS | Status: DC
  Administered 2024-05-17 (×2): 5 mg/h via INTRAVENOUS
  Filled 2024-05-17 (×2): qty 125

## 2024-05-17 MED ORDER — APIXABAN 2.5 MG PO TABS
2.5000 mg | ORAL_TABLET | Freq: Two times a day (BID) | ORAL | Status: DC
  Administered 2024-05-17 – 2024-05-22 (×10): 2.5 mg via ORAL
  Filled 2024-05-17 (×10): qty 1

## 2024-05-17 MED ORDER — METOPROLOL TARTRATE 5 MG/5ML IV SOLN
5.0000 mg | Freq: Once | INTRAVENOUS | Status: AC
  Administered 2024-05-17: 5 mg via INTRAVENOUS
  Filled 2024-05-17: qty 5

## 2024-05-17 MED ORDER — ALBUTEROL SULFATE (2.5 MG/3ML) 0.083% IN NEBU: 2.5000 mg | INHALATION_SOLUTION | Freq: Four times a day (QID) | RESPIRATORY_TRACT | Status: DC | PRN

## 2024-05-17 MED ORDER — ACETAMINOPHEN 325 MG PO TABS
650.0000 mg | ORAL_TABLET | Freq: Four times a day (QID) | ORAL | Status: DC | PRN
  Administered 2024-05-17 – 2024-05-21 (×5): 650 mg via ORAL
  Filled 2024-05-17 (×5): qty 2

## 2024-05-17 MED ORDER — MECLIZINE HCL 25 MG PO TABS
12.5000 mg | ORAL_TABLET | Freq: Once | ORAL | Status: AC
  Administered 2024-05-17: 12.5 mg via ORAL
  Filled 2024-05-17: qty 1

## 2024-05-17 MED ORDER — POLYETHYLENE GLYCOL 3350 17 G PO PACK: 17.0000 g | PACK | Freq: Every day | ORAL | Status: DC | PRN

## 2024-05-17 MED ORDER — ACETAMINOPHEN 650 MG RE SUPP: 650.0000 mg | Freq: Four times a day (QID) | RECTAL | Status: DC | PRN

## 2024-05-17 NOTE — Assessment & Plan Note (Signed)
 Patient presents with dizziness and headache.  Recent diagnosis of A-fib discovered 2 days ago at pulmonary rehab with heart rate 130s to 140s.  Seen by cardiology yesterday and started on Eliquis  and Lopressor .  In RVR again today with heart rate 130s 140s in the ED.  Rates improved after Cardizem bolus, did not require the drip to be started per EDP. --Admit for observation --Cardiology consulted --Monitor on telemetry --Continue Eliquis  --Cardizem drip remains ordered if needed for rate control pending transfer to the inpatient unit --Cardiogram ordered

## 2024-05-17 NOTE — H&P (Signed)
 History and Physical    Patient: Ashley Mayer FMW:[id number] DOB: 05-18-1941 DOA: 05/17/2024 DOS: the patient was seen and examined on 05/17/2024 PCP: Rolinda Millman, MD   Referring Provider: Mliss Boyers, MD Telemedicine Provider: Burnard Cunning, DO Patient Location: [facility] Referring Diagnosis: Dizziness, A-fib with RVR Patient Name and DOB verified: Ashley Mayer, 05-10-1941 Patient consented to Telemedicine Evaluation: yes RN virtual assistant: Grayce Sayres, Paramedic Video encounter time and date: 05/17/2024 2:00 PM   Patient coming from: Home  Chief Complaint:  Chief Complaint  Patient presents with   Dizziness   HPI: Ashley Mayer is a 83 y.o. female with medical history significant of recently diagnosed A-fib seen by Cardiology yesterday, has taken 2 doses Eliquis  so far, HTN, HLD, emphysema/bronchiectasis followed by Pulmonology who presented to [facility] ED for evaluation of dizziness and headache this morning.  Pt reported onset around 9 AM today, but has been having spells of dizziness that feels similar to vertigo during which time she often feels she cannot catch her breath.  She reports being able to stand but cannot walk straight during these times.  Reports sometimes nauseated but without vomiting.  She has had the spells on and off in the past, typically resolve quickly, recently becoming more frequent and longer in duration.  This morning's Adin was the worst she has experienced yet.  She denies vision changes, no unilateral weakness, numbness or tingling, no difficulty speaking or swallowing..  Patient was at pulmonary rehab on 12/9 where she was found to be in A-fib with RVR with HR in 130s to 140s.  Pt was reportedly asymptomatic at that time without palpitations, dizziness / lightheadedness, dyspnea or chest pain.  She was seen by Cardiology in clinic yesterday where she was feeling a little dizzy, found with elevated heart rate again and  given oral Lopressor .    ED course: Initial vitals-- Temp 97.9 F, HR 110s (on the monitor 130s to 140s per EDP and bedside RN) RR 21, BP 116/93 later 130/87.  SpO2 100% on room air. Labs obtained including CMP and CBC were notable for sodium 134, chloride 97, glucose 110 and creatinine 1.26, hemoglobin 11.7.  Troponin was normal at 16.  Viral PCR is negative for COVID, flu A/B and RSV.  UA was without any signs of infection.  UDS was negative.   Imaging--portable chest x-ray showed no acute findings. CT head without contrast with no acute intracranial abnormalities, showed moderately advanced chronic small vessel disease  Patient was given 20 mg bolus IV Cardizem in the ED with heart rate reportedly improved to the 60s but remaining in A-fib.  Dizziness improved by time of my encounter.  Patient is admitted to the hospital for observation and further evaluation and management of A-fib with RVR and dizziness as outlined in detail below.   Review of Systems: As mentioned in the history of present illness. All other systems reviewed and are negative.   Past Medical History:  Diagnosis Date   A-fib [facility])    Anticipatory grief    Arthritis    hands (09/17/2013)   Celiac disease    CKD (chronic kidney disease), stage III (HCC)    Collagenous colitis    Dyspnea on exertion    Estrogen deficiency    GERD (gastroesophageal reflux disease)    Hand arthritis    Hearing loss    Hyperlipidemia    Hypertension    IBS (irritable bowel syndrome)    Low vitamin B12 level  Multiple food allergies    Nocturnal leg cramps    Osteopenia    Sleep disturbance    Unspecified vitamin D  deficiency    Varicose veins of bilateral lower extremities with pain    Past Surgical History:  Procedure Laterality Date   ANTERIOR AND POSTERIOR REPAIR  05/2009   BLADDER SUSPENSION  05/2009   CATARACT EXTRACTION W/ INTRAOCULAR LENS  IMPLANT, BILATERAL Bilateral 2000's   CHOLECYSTECTOMY  2000's   VAGINAL  HYSTERECTOMY  ~ 1977   Social History:  reports that she has never smoked. She has never used smokeless tobacco. She reports that she does not drink alcohol and does not use drugs.  Allergies[1]  Family History  Problem Relation Age of Onset   Stroke Mother    Hyperlipidemia Mother    Cancer Father        mesothelioma   Diabetes Sister    Heart disease Brother    Hyperlipidemia Brother    Hypertension Brother    Stroke Brother    Hemochromatosis Son    Cancer Brother        bladder with mets to kidneys   Stroke Other    Hypertension Other    Hyperlipidemia Other    Cancer Other    Asthma Other    Heart attack Other     Prior to Admission medications  Medication Sig Start Date End Date Taking? Authorizing Provider  albuterol  (VENTOLIN  HFA) 108 (90 Base) MCG/ACT inhaler Inhale 2 puffs into the lungs every 6 (six) hours as needed for wheezing or shortness of breath. 12/30/22   Shellia Oh, MD  apixaban  (ELIQUIS ) 2.5 MG TABS tablet Take 1 tablet (2.5 mg total) by mouth 2 (two) times daily. 05/16/24   Swinyer, Rosaline HERO, NP  aspirin  EC 81 MG tablet Take 81 mg by mouth daily.    [provider]  Calcium  & Magnesium Carbonates (MYLANTA PO) Take 1 tablet by mouth as needed.    [provider]  Cholecalciferol (VITAMIN D3) 1000 units CAPS Take 1 tablet by mouth daily.    [provider]  Cyanocobalamin  (VITAMIN B-12 PO) Takes 2,500 mg by mouth occasionally    [provider]  fluticasone  (FLONASE ) 50 MCG/ACT nasal spray Place 2 sprays into both nostrils daily. Patient taking differently: Place 2 sprays into both nostrils daily as needed for allergies or rhinitis. 05/09/15   Aniceto Pfeiffer, PA-C  MAGNESIUM CITRATE PO Take 250 mg by mouth daily at 12 noon.    [provider]  metoprolol  tartrate (LOPRESSOR ) 25 MG tablet Take 1 tablet (25 mg total) by mouth 2 (two) times daily. 05/16/24 08/14/24  Swinyer, Rosaline HERO, NP  multivitamin-lutein   (OCUVITE-LUTEIN ) CAPS Take 1 capsule by mouth daily.    [provider]  OVER THE COUNTER MEDICATION Patient rotates OTC Nexium ,Tagamet and Prevacid for indigestion.    [provider]  pantoprazole  (PROTONIX ) 40 MG tablet Take 40 mg by mouth daily.    [provider]  Probiotic Product (PROBIOTIC DAILY PO) Take by mouth.    [provider]  Pyridoxine HCl (VITAMIN B6 PO) Takes 100 mg by mouth occasionally    [provider]  rosuvastatin  (CRESTOR ) 20 MG tablet Take 1 tablet by mouth once daily 02/21/24   Lonni Slain, MD  umeclidinium bromide  (INCRUSE ELLIPTA ) 62.5 MCG/ACT AEPB Inhale 1 puff into the lungs daily. 02/09/24   [name] Acquanetta Bradley, MD  VITAMIN A PO Take 2,400 mcg by mouth daily at 12 noon.  [provider]  Zinc 50 MG TABS Take 1 tablet by mouth daily.    [provider]    Physical Exam: Vitals:   05/17/24 1230 05/17/24 1330 05/17/24 1400 05/17/24 1546  BP: 126/74 92/78 96/60    Pulse: (!) 101 (!) 38 70   Resp: (!) 26 17 (!) 22   Temp:    98 F (36.7 C)  TempSrc:    Oral  SpO2: 99% 98% 100%    Bedside physical exam was performed by RN listed above. Below exam findings are based on their in person physical exam findings and my observations during virtual encounter.  General exam: awake, alert, no acute distress HEENT: Mildly hard of hearing Respiratory system: CTAB, no wheezes, rales or rhonchi, normal respiratory effort.  On room air Cardiovascular system: normal S1/S2, irregular rhythm, regular rate, no peripheral edema.   Gastrointestinal system: soft, NT, ND,+bowel sounds. Central nervous system: A&O x 3. no gross focal neurologic deficits, normal speech Extremities: Moves all, no peripheral edema Skin: RN at bedside reported skin dry and intact without color changes on the distal lower extremities Psychiatry: normal mood, congruent affect, judgement and insight appear normal   Data  Reviewed:  Labs and diagnostic studies as reviewed in detail above  Assessment and Plan:  * Atrial fibrillation with RVR (HCC) Patient presents with dizziness and headache.  Recent diagnosis of A-fib discovered 2 days ago at pulmonary rehab with heart rate 130s to 140s.  Seen by cardiology yesterday and started on Eliquis  and Lopressor .  In RVR again today with heart rate 130s 140s in the ED.  Rates improved after Cardizem bolus, did not require the drip to be started per EDP. --Admit for observation --Cardiology consulted --Monitor on telemetry --Continue Eliquis  --Cardizem drip remains ordered if needed for rate control pending transfer to the inpatient unit --Cardiogram ordered  Dizziness By history from patient, it seems that these episodes correlate with her elevated heart rates with A-fib, but given she is only taken Eliquis  for 2 doses thus far, she has been at increased risk for stroke or TIA.  CT head was nonacute.  No focal neurologic deficits and dizziness did seem to improve once heart rate was controlled. -- Will obtain MRI brain for stroke rule out --Neurochecks  Essential hypertension Initial BP in the ED was stable at 116/93, later 130/87.  BP did drop to systolic 90s after IV Cardizem bolus was given. --Hold antihypertensives for now while working on rate control --Monitor BPs and titrate regimen  Pulmonary emphysema (HCC) Followed by pulmonology.  Clinically stable without wheezing to suggest exacerbation. --Resume home Incruse  -- As needed albuterol  nebs --Follow-up outpatient with pulmonology as scheduled  Mixed hyperlipidemia Resume statin pending med history verification      Advance Care Planning: CODE STATUS-full code Patient stated that she would want everything tried in the event of cardiac or respiratory arrest including CPR, defibrillation and intubation with mechanical ventilation if needed.  Son was present during this conversation.  Consults:  Cardiology  Family Communication: Son at bedside during virtual admission encounter  Severity of Illness: The appropriate patient status for this patient is OBSERVATION. Observation status is judged to be reasonable and necessary in order to provide the required intensity of service to ensure the patient's safety. The patient's presenting symptoms, physical exam findings, and initial radiographic and laboratory data in the context of their medical condition is felt to place them at decreased risk for further clinical deterioration. Furthermore, it is anticipated  that the patient will be medically stable for discharge from the hospital within 2 midnights of admission.   Author: Burnard DELENA Cunning, DO 05/17/2024 3:52 PM  For on call review [email or web address].      [1]  Allergies Allergen Reactions   Codeine Other (See Comments)    hallucinations   Lactose Intolerance (Gi) Diarrhea   Milk (Cow)     Other reaction(s): Not available   Shrimp Extract     Other reaction(s): Not available   Tomato Diarrhea   Wheat Diarrhea   Morphine  And Codeine Other (See Comments)    Unknown-family history of allergy   Tape Itching and Rash

## 2024-05-17 NOTE — Assessment & Plan Note (Signed)
 Followed by pulmonology.  Clinically stable without wheezing to suggest exacerbation. --Resume home Incruse  -- As needed albuterol  nebs --Follow-up outpatient with pulmonology as scheduled

## 2024-05-17 NOTE — ED Notes (Signed)
 [facility] called to transport the patient to [facility] 4E rm# [id number]

## 2024-05-17 NOTE — ED Triage Notes (Signed)
 Headache and dizziness  Started around 9 AM  Went to work and started feeling bad Some sob Recently DX afib   Provider asked to eval during triage

## 2024-05-17 NOTE — Assessment & Plan Note (Signed)
 Initial BP in the ED was stable at 116/93, later 130/87.  BP did drop to systolic 90s after IV Cardizem bolus was given. --Hold antihypertensives for now while working on rate control --Monitor BPs and titrate regimen

## 2024-05-17 NOTE — Progress Notes (Signed)
 Patient seen  at bedside.  Complains of feeling much better than when she came in. rate controlled at this time.  Family at bedside.  Appears to be stable

## 2024-05-17 NOTE — ED Provider Notes (Signed)
 [place] EMERGENCY DEPARTMENT AT [facility] Provider Note   CSN: [id number] Arrival date & time: 05/17/24  1110     Patient presents with: Dizziness   Ashley Mayer is a 83 y.o. female.   Pt is a 83 yo female with pmhx significant for afib (dx'd yesterday, 2 doses of eliquis  so far, also lopressor  rx), GERD, HLD, HTN, Arthritis, and CKD.  Pt said she was at home today and developed a headache and dizziness around 0900.  She had some sob, so she came to the ED.  She is feeling better now.  She still has some dizziness.  No weakness or numbness to the arms/legs; no speech problems.  No vision problems.  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       Prior to Admission medications  Medication Sig Start Date End Date Taking? Authorizing Provider  albuterol  (VENTOLIN  HFA) 108 (90 Base) MCG/ACT inhaler Inhale 2 puffs into the lungs every 6 (six) hours as needed for wheezing or shortness of breath. 12/30/22   Sood, Vineet, MD  apixaban  (ELIQUIS ) 2.5 MG TABS tablet Take 1 tablet (2.5 mg total) by mouth 2 (two) times daily. 05/16/24   Swinyer, Rosaline HERO, NP  aspirin  EC 81 MG tablet Take 81 mg by mouth daily.    [provider]  Calcium  & Magnesium Carbonates (MYLANTA PO) Take 1 tablet by mouth as needed.    [provider]  Cholecalciferol (VITAMIN D3) 1000 units CAPS Take 1 tablet by mouth daily.    [provider]  Cyanocobalamin  (VITAMIN B-12 PO) Takes 2,500 mg by mouth occasionally    [provider]  fluticasone  (FLONASE ) 50 MCG/ACT nasal spray Place 2 sprays into both nostrils daily. Patient taking differently: Place 2 sprays into both nostrils daily as needed for allergies or rhinitis. 05/09/15   Aniceto Pfeiffer, PA-C  MAGNESIUM CITRATE PO Take 250 mg by mouth daily at 12 noon.    [provider]  metoprolol  tartrate (LOPRESSOR ) 25 MG tablet Take 1 tablet (25 mg total) by mouth 2 (two) times daily.  05/16/24 08/14/24  Swinyer, Rosaline HERO, NP  multivitamin-lutein  (OCUVITE-LUTEIN ) CAPS Take 1 capsule by mouth daily.    [provider]  OVER THE COUNTER MEDICATION Patient rotates OTC Nexium ,Tagamet and Prevacid for indigestion.    [provider]  pantoprazole  (PROTONIX ) 40 MG tablet Take 40 mg by mouth daily.    [provider]  Probiotic Product (PROBIOTIC DAILY PO) Take by mouth.    [provider]  Pyridoxine HCl (VITAMIN B6 PO) Takes 100 mg by mouth occasionally    [provider]  rosuvastatin  (CRESTOR ) 20 MG tablet Take 1 tablet by mouth once daily 02/21/24   Lonni Slain, MD  umeclidinium bromide  (INCRUSE ELLIPTA ) 62.5 MCG/ACT AEPB Inhale 1 puff into the lungs daily. 02/09/24   [name] Acquanetta Bradley, MD  VITAMIN A PO Take 2,400 mcg by mouth daily at 12 noon.    [provider]  Zinc 50 MG TABS Take 1 tablet by mouth daily.    [provider]    Allergies: Codeine, Lactose intolerance (gi), Milk (cow), Shrimp extract, Tomato, Wheat, Morphine  and codeine, and Tape    Review of Systems  Respiratory:  Positive for shortness of breath.   Neurological:  Positive for dizziness.  All other systems reviewed and are negative.   Updated Vital Signs BP 92/78   Pulse (!) 38  Temp 97.9 F (36.6 C) (Oral)   Resp 17   SpO2 98%   Physical Exam Vitals and nursing note reviewed.  Constitutional:      Appearance: Normal appearance.  HENT:     Head: Normocephalic and atraumatic.     Right Ear: External ear normal.     Left Ear: External ear normal.     Nose: Nose normal.     Mouth/Throat:     Mouth: Mucous membranes are moist.     Pharynx: Oropharynx is clear.  Eyes:     Extraocular Movements: Extraocular movements intact.     Conjunctiva/sclera: Conjunctivae normal.     Pupils: Pupils are equal, round, and reactive to light.  Cardiovascular:     Rate and Rhythm: Tachycardia present. Rhythm irregular.     Pulses:  Normal pulses.     Heart sounds: Normal heart sounds.  Pulmonary:     Effort: Pulmonary effort is normal.     Breath sounds: Normal breath sounds.  Abdominal:     General: Abdomen is flat. Bowel sounds are normal.     Palpations: Abdomen is soft.  Musculoskeletal:        General: Normal range of motion.     Cervical back: Normal range of motion and neck supple.  Skin:    General: Skin is warm.     Capillary Refill: Capillary refill takes less than 2 seconds.  Neurological:     General: No focal deficit present.     Mental Status: She is alert and oriented to person, place, and time.  Psychiatric:        Mood and Affect: Mood normal.        Behavior: Behavior normal.     (all labs ordered are listed, but only abnormal results are displayed) Labs Reviewed  PROTIME-INR - Abnormal; Notable for the following components:      Result Value   Prothrombin Time 15.9 (*)    All other components within normal limits  CBC - Abnormal; Notable for the following components:   RBC 3.60 (*)    Hemoglobin 11.7 (*)    HCT 35.3 (*)    All other components within normal limits  COMPREHENSIVE METABOLIC PANEL WITH GFR - Abnormal; Notable for the following components:   Sodium 134 (*)    Chloride 97 (*)    Glucose, Bld 110 (*)    Creatinine, Ser 1.26 (*)    GFR, Estimated 42 (*)    All other components within normal limits  URINALYSIS, ROUTINE W REFLEX MICROSCOPIC - Abnormal; Notable for the following components:   Color, Urine COLORLESS (*)    All other components within normal limits  RESP PANEL BY RT-PCR (RSV, FLU A&B, COVID)  RVPGX2  DIFFERENTIAL  URINE DRUG SCREEN  CBG MONITORING, ED  TROPONIN T, HIGH SENSITIVITY  TROPONIN T, HIGH SENSITIVITY    EKG: EKG Interpretation Date/Time:  Thursday May 17 2024 11:34:45 EST Ventricular Rate:  125 PR Interval:    QRS Duration:  100 QT Interval:  343 QTC Calculation: 495 R Axis:   96  Text Interpretation: Atrial fibrillation  Ventricular premature complex Right axis deviation Low voltage, extremity and precordial leads Borderline prolonged QT interval No significant change since last tracing Confirmed by Dean Clarity [phone]) on 05/17/2024 12:29:06 PM  Radiology: CT HEAD WO CONTRAST Result Date: 05/17/2024 EXAM: CT Head Without Intravenous Contrast. CLINICAL HISTORY: 83 year old female with acute neurologic deficit, stroke suspected, and headache and dizziness onset 0900 hours. TECHNIQUE: Axial computed  tomography images of the head/brain without intravenous contrast. Dose reduction technique was used including one or more of the following: automated exposure control, adjustment of mA and kV according to patient size, and/or iterative reconstruction. CONTRAST: Without. COMPARISON: Head CT 10/11/2014. FINDINGS: BRAIN: Brain volume within normal limits for age. 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 Calcified atherosclerosis at the skull base. No acute intraparenchymal hemorrhage. No mass lesion. No CT evidence for acute territorial infarct. No midline shift or extra-axial collection. No suspicious intracranial vascular hyperdensity. VENTRICLES: No hydrocephalus. ORBITS: The orbits are unremarkable. SINUSES AND MASTOIDS: Minor paranasal sinus mucosal thickening, regressed since the previous exam. Tympanic cavities and mastoids appear clear. SOFT TISSUES: No acute orbital scalp soft tissue finding. No radiopaque foreign body is seen. BONES: No acute skull fracture. IMPRESSION: 1. No acute intracranial abnormality. 2. Moderately advanced chronic small vessel disease. Electronically signed by: Helayne Hurst MD 05/17/2024 12:57 PM EST RP Workstation: [id number]     Procedures   Medications Ordered in the ED  diltiazem (CARDIZEM) 1 mg/mL load via infusion 20 mg (20 mg Intravenous Bolus from Bag 05/17/24 1304)    And   diltiazem (CARDIZEM) 125 mg in dextrose 5% 125 mL (1 mg/mL) infusion (0 mg/hr Intravenous Stopped 05/17/24 1320)  meclizine (ANTIVERT) tablet 12.5 mg (12.5 mg Oral Given 05/17/24 1155)  metoprolol  tartrate (LOPRESSOR ) injection 5 mg (5 mg Intravenous Given 05/17/24 1158)                                    Medical Decision Making Amount and/or Complexity of Data Reviewed Labs: ordered. Radiology: ordered.  Risk Prescription drug management. Decision regarding hospitalization.   This patient presents to the ED for concern of dizziness, this involves an extensive number of treatment options, and is a complaint that carries with it a high risk of complications and morbidity.  The differential diagnosis includes CVA,    Co morbidities that complicate the patient evaluation  afib (dx'd yesterday, 2 doses of eliquis  so far, also lopressor  rx), GERD, HLD, HTN, Arthritis, and CKD   Additional history obtained:  Additional history obtained from epic chart review External records from outside source obtained and reviewed including son   Lab Tests:  I Ordered, and personally interpreted labs.  The pertinent results include:  cbc with hgb low at 11.7 (stable); cmp nl other than cr sl elevated at 1.26 (1 last year); inr 1.2; trop nl; covid/flu/rsv neg   Imaging Studies ordered:  I ordered imaging studies including ct head and CXR I independently visualized and interpreted imaging which showed  CT chest:  No acute intracranial abnormality.  2. Moderately advanced chronic small vessel disease.   I agree with the radiologist interpretation   Cardiac Monitoring:  The patient was maintained on a cardiac monitor.  I personally viewed and interpreted the cardiac monitored which showed an underlying rhythm of: afib with rvr   Medicines ordered and prescription drug management:  I ordered medication including cardizem  for afib  Reevaluation of the patient after these medicines  showed that the patient improved I have reviewed the patients home medicines and have made adjustments as needed   Test Considered:  ct  Consultations Obtained:  I requested consultation with the hospitalist (Dr. Fausto),  and discussed lab and imaging findings as well as pertinent plan - she will  admit   Problem List / ED Course:  Dizziness:  afib with rvr vs TIA vs vertigo.  She will need a MRI brain to further eval.  We don't have this here.  Dizziness has improved. Afib with rvr:  HR did not improve with lopressor  IV.  She was given a dose of cardizem and HR dropped to the 50s.  Drip not turned on due to good response from the bolus.  We will continue to monitor.  Pt is on Eliquis  and took her first dose last night and another dose this am. CHA2DS2/VAS Stroke Risk Points  Current as of 2 hours ago     7 >= 2 Points: High Risk  1 to 1.99 Points: Medium Risk  0 Points: Low Risk    No Change      Details    This score determines the patient's risk of having a stroke if the  patient has atrial fibrillation.       Points Metrics  0 Has Congestive Heart Failure:  No    Current as of 2 hours ago  1 Has Vascular Disease:  Yes    Current as of 2 hours ago  1 Has Hypertension:  Yes    Current as of 2 hours ago  2 Age:  4    Current as of 2 hours ago  0 Has Diabetes Excluding Gestational Diabetes:  No    Current as of 2 hours ago  2 Had Stroke:  Yes  Had TIA:  No  Had Thromboembolism:  No    Current as of 2 hours ago  1 Female:  Yes    Current as of 2 hours ago             Reevaluation:  After the interventions noted above, I reevaluated the patient and found that they have :improved   Social Determinants of Health:  Lives at home   Dispostion:  After consideration of the diagnostic results and the patients response to treatment, I feel that the patent would benefit from admission.       Final diagnoses:  Atrial fibrillation with RVR (HCC)  TIA  (transient ischemic attack)    ED Discharge Orders     None          Dean Clarity, MD 05/17/24 1351

## 2024-05-17 NOTE — Assessment & Plan Note (Signed)
 By history from patient, it seems that these episodes correlate with her elevated heart rates with A-fib, but given she is only taken Eliquis  for 2 doses thus far, she has been at increased risk for stroke or TIA.  CT head was nonacute.  No focal neurologic deficits and dizziness did seem to improve once heart rate was controlled. -- Will obtain MRI brain for stroke rule out --Neurochecks

## 2024-05-17 NOTE — Progress Notes (Signed)
° °  Brief Progress Note   _____________________________________________________________________________________________________________  Patient Name: Ashley Mayer Patient DOB: 05/30/1941 Date: 05/17/2024     Data: Patient being admitted for A-Fib and on a Dilt gtt. Patient has admit order for [facility].    Action: Sent secure chat to Dr Fausto asking if [facility] bed became available, that patient could be admitted there.    Response: Dr Fausto placed order that patient could be admitted to [facility] or [facility].  _____________________________________________________________________________________________________________  [facility] RN Expeditor Charnay Nazario Please contact us  directly via secure chat (search for [facility]) or by calling us  at [phone] [facility]).

## 2024-05-17 NOTE — Assessment & Plan Note (Signed)
 Resume statin pending med history verification

## 2024-05-18 ENCOUNTER — Encounter (HOSPITAL_BASED_OUTPATIENT_CLINIC_OR_DEPARTMENT_OTHER): Payer: Self-pay | Admitting: Nurse Practitioner

## 2024-05-18 ENCOUNTER — Observation Stay (HOSPITAL_COMMUNITY)

## 2024-05-18 DIAGNOSIS — I4891 Unspecified atrial fibrillation: Secondary | ICD-10-CM | POA: Diagnosis not present

## 2024-05-18 DIAGNOSIS — R42 Dizziness and giddiness: Secondary | ICD-10-CM | POA: Diagnosis not present

## 2024-05-18 DIAGNOSIS — J439 Emphysema, unspecified: Secondary | ICD-10-CM

## 2024-05-18 LAB — ECHOCARDIOGRAM COMPLETE
Height: 60 in
MV M vel: 4.21 m/s
MV Peak grad: 70.8 mmHg
S' Lateral: 2.9 cm
Single Plane A2C EF: 54.5 %
Weight: 2000.01 [oz_av]

## 2024-05-18 LAB — BASIC METABOLIC PANEL WITH GFR
Anion gap: 11 (ref 5–15)
BUN: 16 mg/dL (ref 8–23)
CO2: 22 mmol/L (ref 22–32)
Calcium: 9 mg/dL (ref 8.9–10.3)
Chloride: 101 mmol/L (ref 98–111)
Creatinine, Ser: 1.1 mg/dL — ABNORMAL HIGH (ref 0.44–1.00)
GFR, Estimated: 49 mL/min — ABNORMAL LOW (ref >60)
Glucose, Bld: 94 mg/dL (ref 70–99)
Potassium: 3.9 mmol/L (ref 3.5–5.1)
Sodium: 134 mmol/L — ABNORMAL LOW (ref 135–145)

## 2024-05-18 LAB — TSH: TSH: 4.44 u[IU]/mL (ref 0.350–4.500)

## 2024-05-18 MED ORDER — PANTOPRAZOLE SODIUM 40 MG PO TBEC: 40.0000 mg | DELAYED_RELEASE_TABLET | Freq: Every day | ORAL | Status: DC | PRN

## 2024-05-18 MED ORDER — UMECLIDINIUM BROMIDE 62.5 MCG/ACT IN AEPB
1.0000 | INHALATION_SPRAY | Freq: Every day | RESPIRATORY_TRACT | Status: DC
  Administered 2024-05-18 – 2024-05-22 (×5): 1 via RESPIRATORY_TRACT
  Filled 2024-05-18: qty 7

## 2024-05-18 MED ORDER — ROSUVASTATIN CALCIUM 20 MG PO TABS
20.0000 mg | ORAL_TABLET | Freq: Every day | ORAL | Status: DC
  Administered 2024-05-18 – 2024-05-22 (×5): 20 mg via ORAL
  Filled 2024-05-18 (×5): qty 1

## 2024-05-18 MED ORDER — DILTIAZEM HCL ER COATED BEADS 120 MG PO CP24
120.0000 mg | ORAL_CAPSULE | Freq: Every day | ORAL | Status: DC
  Administered 2024-05-18 – 2024-05-22 (×5): 120 mg via ORAL
  Filled 2024-05-18 (×5): qty 1

## 2024-05-18 NOTE — Progress Notes (Signed)
 PROGRESS NOTE    Ashley Mayer  FMW:[id number] DOB: 22-Dec-1940 DOA: 05/17/2024 PCP: Rolinda Millman, MD    Brief Narrative:   Ashley Mayer is a 83 y.o. female with past medical history significant for paroxysmal atrial fibrillation recently diagnosed and started on metoprolol  and Eliquis  by cardiology, HTN, HLD, peripheral vascular disease, emphysema/bronchiectasis who presented to [facility] ED 05/17/2024 for evaluation of dizziness, shortness of breath, and headache.  Reports onset around 9 AM in which feels similar to previous vertigo spells as well as having difficulty catching her breath.  Patient reports spells in the past typically resolve quickly.  Now her symptoms presenting more frequently and longer in duration.  Denies vision changes, no focal weakness, no paresthesias, no difficulty with speaking or swallowing.  Patient was at pulmonary rehab on 12/9 where she was found to be in A-fib with RVR with HR in 130s to 140s. Pt was reportedly asymptomatic at that time without palpitations, dizziness / lightheadedness, dyspnea or chest pain. She was seen by Cardiology in clinic yesterday where she was feeling a little dizzy, found with elevated heart rate again and given oral Lopressor .   In the ED, temperature 97.9 F, HR 112, RR 20, BP 133/84, SpO2 96% on room air.  WBC 7.6, hemoglobin 11.7, platelet count 302.  Sodium 134, potassium 3.9, chloride 101, CO2 22, glucose 94, BUN 16, creat 1.10.  AST 33, ALT 27, total Ruben 0.6.  High-sensitivity troponin 16.  COVID/influenza/RSV PCR negative.  Urinalysis unrevealing.  UDS negative.  CT head without contrast with no acute intracranial O'Malley, moderate manage chronic small vessel disease.  MRI brain without contrast with no acute intracranial malady, moderate small vessel ischemic disease.  EKG with atrial fibrillation with RVR, rate 125.  Patient was started on Cardizem drip.  Cardiology consulted.  TRH consulted for  admission and patient was transferred to [facility] for further evaluation and management.   Assessment & Plan:   Paroxysmal atrial fibrillation with RVR HTN Patient presenting with dizziness, headache and shortness of breath.  Recently diagnosed with atrial fibrillation 2 days prior to pulmonary rehab and recently started on Eliquis  and Lopressor .  On arrival to the ED patient's heart rates in the 130s-140s.  Patient was started on a Cardizem drip.  Troponin 16, urinalysis unrevealing.  UDS negative.  TSH 4.440, within normal limits. -- Cardiology following, appreciate assistance -- Cardizem drip now transition to Cardizem 100 mg p.o. daily -- Avoiding beta-blockers due to emphysema/bronchiectasis -- Eliquis  2.5 g p.o. twice daily -- TTE: Pending -- Continue to monitor on telemetry, if unable to control cardiology plans for TEE/cardioversion on Monday  Emphysema/bronchiectasis Follows with pulmonology outpatient.  Beta-blocker now discontinued.   -- Incruse Ellipta  1 puff daily -- Albuterol  neb every 6 hours.  Wheezing/shortness of breath -- Resume pulmonary rehab on discharge  CKD stage IIIa -- Cr 1.26>1.10; stable  HLD -- Crestor  10 mg p.o. daily  Peripheral vascular disease Recent ABI abnormal.  Outpatient follow-up with vascular surgery.   DVT prophylaxis: apixaban  (ELIQUIS ) tablet 2.5 mg Start: 05/17/24 2200 apixaban  (ELIQUIS ) tablet 2.5 mg    Code Status: Full Code Family Communication: Updated family present at bedside this morning  Disposition Plan:  Level of care: Telemetry Status is: Observation The patient remains OBS appropriate and will d/c before 2 midnights.    Consultants:  Cardiology  Procedures:  TTE: Pending  Antimicrobials:  None   Subjective: Patient seen examined bedside, lying in bed.  Multiple family members  present.  Remains on IV Cardizem drip.  Awaiting echocardiogram.  Seen by cardiology with plan to transition to oral  Cardizem today.  Will need further monitoring of her heart rate on transition and if remains labile to control cardiology plans for TEE/cardioversion on Monday.  No other questions or concerns at this time.  Currently denies headache, no visual changes, no chest pain, no palpitations, no current shortness of breath, no current dizziness, no fever/chills/night sweats, no nausea/vomiting/diarrhea, no focal weakness, no fatigue, no paresthesias.  No acute events overnight per nursing staff.  Objective: Vitals:   05/17/24 2127 05/17/24 2300 05/18/24 0135 05/18/24 0832  BP: 107/64 (!) 107/47 (!) 100/54   Pulse: (!) 107  76   Resp: 16  14   Temp: 97.7 F (36.5 C)     TempSrc: Oral     SpO2: 97%  98% 97%  Weight:      Height:        Intake/Output Summary (Last 24 hours) at 05/18/2024 1251 Last data filed at 05/18/2024 0600 Gross per 24 hour  Intake 55.53 ml  Output --  Net 55.53 ml   Filed Weights   05/17/24 1901  Weight: 56.7 kg    Examination:  Physical Exam: GEN: NAD, alert and oriented x 3, wd/wn HEENT: NCAT, PERRL, EOMI, sclera clear, MMM PULM: CTAB w/o wheezes/crackles, normal respiratory effort CV: Irregularly irregular rhythm, normal rate w/o M/G/R GI: abd soft, NTND, NABS, no R/G/M MSK: no peripheral edema, muscle strength globally intact 5/5 bilateral upper/lower extremities NEURO: CN II-XII intact, no focal deficits, sensation to light touch intact PSYCH: normal mood/affect Integumentary: dry/intact, no rashes or wounds    Data Reviewed: I have personally reviewed following labs and imaging studies  CBC: Recent Labs  Lab 05/17/24 1136  WBC 7.6  NEUTROABS 5.8  HGB 11.7*  HCT 35.3*  MCV 98.1  PLT 302   Basic Metabolic Panel: Recent Labs  Lab 05/17/24 1136 05/17/24 1925 05/18/24 0501  NA 134*  --  134*  K 5.0  --  3.9  CL 97*  --  101  CO2 24  --  22  GLUCOSE 110*  --  94  BUN 18  --  16  CREATININE 1.26*  --  1.10*  CALCIUM  10.2  --  9.0  MG  --   2.1  --    GFR: Estimated Creatinine Clearance: 30.1 mL/min (A) (by C-G formula based on SCr of 1.1 mg/dL (H)). Liver Function Tests: Recent Labs  Lab 05/17/24 1136  AST 33  ALT 27  ALKPHOS 70  BILITOT 0.6  PROT 7.3  ALBUMIN 4.5   No results for input(s): LIPASE, AMYLASE in the last 168 hours. No results for input(s): AMMONIA in the last 168 hours. Coagulation Profile: Recent Labs  Lab 05/17/24 1136  INR 1.2   Cardiac Enzymes: No results for input(s): CKTOTAL, CKMB, CKMBINDEX, TROPONINI in the last 168 hours. BNP (last 3 results) No results for input(s): PROBNP in the last 8760 hours. HbA1C: No results for input(s): HGBA1C in the last 72 hours. CBG: Recent Labs  Lab 05/17/24 1127  GLUCAP 95   Lipid Profile: No results for input(s): CHOL, HDL, LDLCALC, TRIG, CHOLHDL, LDLDIRECT in the last 72 hours. Thyroid  Function Tests: Recent Labs    05/18/24 0501  TSH 4.440   Anemia Panel: No results for input(s): VITAMINB12, FOLATE, FERRITIN, TIBC, IRON, RETICCTPCT in the last 72 hours. Sepsis Labs: No results for input(s): PROCALCITON, LATICACIDVEN in the last 168 hours.  Recent Results (from the past 240 hours)  Resp panel by RT-PCR (RSV, Flu A&B, Covid) Anterior Nasal Swab     Status: None   Collection Time: 05/17/24 12:07 PM   Specimen: Anterior Nasal Swab  Result Value Ref Range Status   SARS Coronavirus 2 by RT PCR NEGATIVE NEGATIVE Final    Comment: (NOTE) SARS-CoV-2 target nucleic acids are NOT DETECTED.  The SARS-CoV-2 RNA is generally detectable in upper respiratory specimens during the acute phase of infection. The lowest concentration of SARS-CoV-2 viral copies this assay can detect is 138 copies/mL. A negative result does not preclude SARS-Cov-2 infection and should not be used as the sole basis for treatment or other patient management decisions. A negative result may occur with  improper specimen  collection/handling, submission of specimen other than nasopharyngeal swab, presence of viral mutation(s) within the areas targeted by this assay, and inadequate number of viral copies(<138 copies/mL). A negative result must be combined with clinical observations, patient history, and epidemiological information. The expected result is Negative.  Fact Sheet for Patients:  bloggercourse.com  Fact Sheet for Healthcare Providers:  seriousbroker.it  This test is no t yet approved or cleared by the [place]  FDA and  has been authorized for detection and/or diagnosis of SARS-CoV-2 by FDA under an Emergency Use Authorization (EUA). This EUA will remain  in effect (meaning this test can be used) for the duration of the COVID-19 declaration under Section 564(b)(1) of the Act, 21 U.S.C.section 360bbb-3(b)(1), unless the authorization is terminated  or revoked sooner.       Influenza A by PCR NEGATIVE NEGATIVE Final   Influenza B by PCR NEGATIVE NEGATIVE Final    Comment: (NOTE) The Xpert Xpress SARS-CoV-2/FLU/RSV plus assay is intended as an aid in the diagnosis of influenza from Nasopharyngeal swab specimens and should not be used as a sole basis for treatment. Nasal washings and aspirates are unacceptable for Xpert Xpress SARS-CoV-2/FLU/RSV testing.  Fact Sheet for Patients: bloggercourse.com  Fact Sheet for Healthcare Providers: seriousbroker.it  This test is not yet approved or cleared by the [place]  FDA and has been authorized for detection and/or diagnosis of SARS-CoV-2 by FDA under an Emergency Use Authorization (EUA). This EUA will remain in effect (meaning this test can be used) for the duration of the COVID-19 declaration under Section 564(b)(1) of the Act, 21 U.S.C. section 360bbb-3(b)(1), unless the authorization is terminated or revoked.     Resp Syncytial  Virus by PCR NEGATIVE NEGATIVE Final    Comment: (NOTE) Fact Sheet for Patients: bloggercourse.com  Fact Sheet for Healthcare Providers: seriousbroker.it  This test is not yet approved or cleared by the [place]  FDA and has been authorized for detection and/or diagnosis of SARS-CoV-2 by FDA under an Emergency Use Authorization (EUA). This EUA will remain in effect (meaning this test can be used) for the duration of the COVID-19 declaration under Section 564(b)(1) of the Act, 21 U.S.C. section 360bbb-3(b)(1), unless the authorization is terminated or revoked.  Performed at [facility], [address], [place], [place] [address]          Radiology Studies: MR BRAIN WO CONTRAST Result Date: 05/17/2024 EXAM: MRI Brain Without Contrast 05/17/2024 06:58:00 PM TECHNIQUE: Multiplanar multisequence MRI of the head/brain was performed without the administration of intravenous contrast. COMPARISON: CT head from earlier today. CLINICAL HISTORY: Transient ischemic attack (TIA); Dizziness and headache FINDINGS: BRAIN AND VENTRICLES: No acute infarct. No intracranial hemorrhage. No mass. No midline shift. No hydrocephalus. The sella  is unremarkable. Normal flow voids. Moderate T2 hyperintensities in the white matter, compatible with small vessel ischemic disease. ORBITS: No acute abnormality. SINUSES AND MASTOIDS: No acute abnormality. BONES AND SOFT TISSUES: Normal marrow signal. No acute soft tissue abnormality. IMPRESSION: 1. No acute intracranial abnormality. 2. Moderate small vessel ischemic disease. Electronically signed by: Gilmore Molt 05/17/2024 10:06 PM EST RP Workstation: [id number]   DG Chest Portable 1 View Result Date: 05/17/2024 EXAM: XR Chest, 1 View(s). CLINICAL HISTORY: afib COMPARISON: 11/30/2022 FINDINGS: LUNGS: The lungs are clear. No consolidation. PLEURAL SPACES: No pleural effusion or pneumothorax.  HEART: The heart size is normal. BONES: No acute osseous abnormality. IMPRESSION: 1. No acute cardiopulmonary abnormality. Electronically signed by: Lynwood Seip MD 05/17/2024 02:15 PM EST RP Workstation: [id number]   CT HEAD WO CONTRAST Result Date: 05/17/2024 EXAM: CT Head Without Intravenous Contrast. CLINICAL HISTORY: 83 year old female with acute neurologic deficit, stroke suspected, and headache and dizziness onset 0900 hours. TECHNIQUE: Axial computed tomography images of the head/brain without intravenous contrast. Dose reduction technique was used including one or more of the following: automated exposure control, adjustment of mA and kV according to patient size, and/or iterative reconstruction. CONTRAST: Without. COMPARISON: Head CT 10/11/2014. FINDINGS: BRAIN: Brain volume within normal limits for age. 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 Calcified atherosclerosis at the skull base. No acute intraparenchymal hemorrhage. No mass lesion. No CT evidence for acute territorial infarct. No midline shift or extra-axial collection. No suspicious intracranial vascular hyperdensity. VENTRICLES: No hydrocephalus. ORBITS: The orbits are unremarkable. SINUSES AND MASTOIDS: Minor paranasal sinus mucosal thickening, regressed since the previous exam. Tympanic cavities and mastoids appear clear. SOFT TISSUES: No acute orbital scalp soft tissue finding. No radiopaque foreign body is seen. BONES: No acute skull fracture. IMPRESSION: 1. No acute intracranial abnormality. 2. Moderately advanced chronic small vessel disease. Electronically signed by: Helayne Hurst MD 05/17/2024 12:57 PM EST RP Workstation: [id number]        Scheduled Meds:  apixaban   2.5 mg Oral BID   diltiazem  120 mg Oral Daily   rosuvastatin   20 mg Oral Daily   umeclidinium bromide   1 puff Inhalation Daily   Continuous  Infusions:   LOS: 0 days    Time spent: 52 minutes spent on 05/18/2024 caring for this patient face-to-face including chart review, ordering labs/tests, documenting, discussion with nursing staff, consultants, updating family and interview/physical exam    Camellia PARAS Milind Raether, DO [facility] Available via Epic secure chat 7am-7pm After these hours, please refer to coverage provider listed on [email or web address] 05/18/2024, 12:51 PM

## 2024-05-18 NOTE — Progress Notes (Signed)
°  Echocardiogram 2D Echocardiogram has been performed.  Ashley Mayer 05/18/2024, 1:32 PM

## 2024-05-18 NOTE — Care Management Obs Status (Signed)
 MEDICARE OBSERVATION STATUS NOTIFICATION   Patient Details  Name: Ashley Mayer MRN: [id number] Date of Birth: 1941-02-18   Medicare Observation Status Notification Given:  Yes    Bascom Service, RN 05/18/2024, 1:42 PM

## 2024-05-18 NOTE — Consult Note (Addendum)
 Cardiology Consultation   Patient ID: Ashley Mayer MRN: [id number]; DOB: [birth year]  Admit date: 05/17/2024 Date of Consult: 05/18/2024  PCP:  Rolinda Millman, MD   [facility] Providers Cardiologist:  Shelda Bruckner, MD      Patient Profile: Ashley Mayer is a 83 y.o. female with a hx of HTN/?white coat hypertension, HLD, mild to moderate MR, CKD III, HLD, celliac, emphysema/bronchiectasis, and newly recognized Afib who is being seen 05/18/2024 for the evaluation of Afib RVR at the request of Dr. Austria.  History of Present Illness:  [name] has been evaluated for chest pain and DOE. Echo 2023 demonstrated LVEF 50-55% normal RV, mild to moderate MR, aortic sclerosis without stenosis. Coronary calcium  score 2023 was 573 placing her at the 70th percentile. She has a strong family history of CAD and has been told she has PAD, but has denied claudication.   ABIs 05/07/24 with moderate bilateral PAD.   She was seen in pulmonary rehab 05/15/24 found to be in new onset Afib RVR with HR in the 130-140s. She reportedly was asymptomatic and unaware of her arrhythmia or RVR. She was seen by Rosaline Bane NP in cardiology clinic 05/16/24 and started on eliquis  and lopressor . That evening, she developed headache and dizziness with SOB.  She presented to the ER for evaluation and transferred to [facility]. She was started on cardizem gtt for rate control now running at 5 mg/hr and continued on 2.5 mg eliquis , dosed for age and weight.  Cardiology consulted for further management.  Telemetry shows persistent Afib now rate controlled in the 60-100s, primarily in the 70s on exam but in the 100s while talking.   She describes ongoing nocturnal palpitations for some time. She has had issues since her HRT/estrogen was discontinued several years ago.   She reports general unawareness of her rhythm but had a sudden change in symptoms yesterday primarily described as profound DOE  and dizziness.   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 She does describe symptoms consistent with claudication and arthritis.   Past Medical History:  Diagnosis Date   A-fib [facility])    Anticipatory grief    Arthritis    hands (09/17/2013)   Celiac disease    CKD (chronic kidney disease), stage III (HCC)    Collagenous colitis    Dyspnea on exertion    Estrogen deficiency    GERD (gastroesophageal reflux disease)    Hand arthritis    Hearing loss    Hyperlipidemia    Hypertension    IBS (irritable bowel syndrome)    Low vitamin B12 level    Multiple food allergies    Nocturnal leg cramps    Osteopenia    Sleep disturbance    Unspecified vitamin D  deficiency    Varicose veins of bilateral lower extremities with pain     Past Surgical History:  Procedure Laterality Date   ANTERIOR AND POSTERIOR REPAIR  05/2009   BLADDER SUSPENSION  05/2009   CATARACT EXTRACTION W/ INTRAOCULAR LENS  IMPLANT, BILATERAL Bilateral 2000's   CHOLECYSTECTOMY  2000's   VAGINAL HYSTERECTOMY  ~ 1977     Home Medications:  Prior to Admission medications  Medication Sig Start Date End Date Taking? Authorizing Provider  albuterol  (VENTOLIN  HFA) 108 (90 Base) MCG/ACT inhaler Inhale 2 puffs into the lungs every 6 (six) hours as needed for wheezing or shortness of breath. 12/30/22   Shellia Oh, MD  apixaban  (ELIQUIS )  2.5 MG TABS tablet Take 1 tablet (2.5 mg total) by mouth 2 (two) times daily. 05/16/24   Swinyer, Rosaline HERO, NP  aspirin  EC 81 MG tablet Take 81 mg by mouth daily.    [provider]  Calcium  & Magnesium Carbonates (MYLANTA PO) Take 1 tablet by mouth as needed.    [provider]  Cholecalciferol (VITAMIN D3) 1000 units CAPS Take 1 tablet by mouth daily.    [provider]  Cyanocobalamin  (VITAMIN B-12 PO) Takes 2,500 mg by mouth occasionally    [provider]  fluticasone  (FLONASE ) 50 MCG/ACT nasal spray Place 2  sprays into both nostrils daily. Patient taking differently: Place 2 sprays into both nostrils daily as needed for allergies or rhinitis. 05/09/15   Aniceto Pfeiffer, PA-C  MAGNESIUM CITRATE PO Take 250 mg by mouth daily at 12 noon.    [provider]  metoprolol  tartrate (LOPRESSOR ) 25 MG tablet Take 1 tablet (25 mg total) by mouth 2 (two) times daily. 05/16/24 08/14/24  Swinyer, Rosaline HERO, NP  multivitamin-lutein  (OCUVITE-LUTEIN ) CAPS Take 1 capsule by mouth daily.    [provider]  OVER THE COUNTER MEDICATION Patient rotates OTC Nexium ,Tagamet and Prevacid for indigestion.    [provider]  pantoprazole  (PROTONIX ) 40 MG tablet Take 40 mg by mouth daily.    [provider]  Probiotic Product (PROBIOTIC DAILY PO) Take by mouth.    [provider]  Pyridoxine HCl (VITAMIN B6 PO) Takes 100 mg by mouth occasionally    [provider]  rosuvastatin  (CRESTOR ) 20 MG tablet Take 1 tablet by mouth once daily 02/21/24   Lonni Slain, MD  umeclidinium bromide  (INCRUSE ELLIPTA ) 62.5 MCG/ACT AEPB Inhale 1 puff into the lungs daily. 02/09/24   [name] Acquanetta Bradley, MD  VITAMIN A PO Take 2,400 mcg by mouth daily at 12 noon.    [provider]  Zinc 50 MG TABS Take 1 tablet by mouth daily.    [provider]    Scheduled Meds:  apixaban   2.5 mg Oral BID   rosuvastatin   20 mg Oral Daily   umeclidinium bromide   1 puff Inhalation Daily   Continuous Infusions:  diltiazem (CARDIZEM) infusion 5 mg/hr (05/17/24 2304)   PRN Meds: acetaminophen  **OR** acetaminophen , albuterol , polyethylene glycol  Allergies:   Allergies[1]  Social History:   Social History   Socioeconomic History   Marital status: Single    Spouse name: Not on file   Number of children: 2   Years of education: 12   Highest education level: High school graduate  Occupational History   Occupation: PRN assistant    Employer: [employer]     Comment: Mostly retired  Tobacco Use   Smoking status: Never   Smokeless tobacco: Never  Vaping Use   Vaping status: Never Used  Substance and Sexual Activity   Alcohol use: No   Drug use: No   Sexual activity: Never  Other Topics Concern   Not on file  Social History Narrative   Not on file   Social Drivers of Health   Tobacco Use: Low Risk (05/17/2024)   Patient History    Smoking Tobacco Use: Never    Smokeless Tobacco Use: Never    Passive Exposure: Not on file  Financial Resource Strain: Not on file  Food Insecurity: No Food Insecurity (05/17/2024)   Epic    Worried About Radiation Protection Practitioner of Food in the Last Year: Never true    Ran  Out of Food in the Last Year: Never true  Transportation Needs: Unmet Transportation Needs (05/17/2024)   Epic    Lack of Transportation (Medical): Yes    Lack of Transportation (Non-Medical): Yes  Physical Activity: Not on file  Stress: Not on file  Social Connections: Moderately Integrated (05/17/2024)   Social Connection and Isolation Panel    Frequency of Communication with Friends and Family: Three times a week    Frequency of Social Gatherings with Friends and Family: Three times a week    Attends Religious Services: More than 4 times per year    Active Member of Clubs or Organizations: Yes    Attends Banker Meetings: 1 to 4 times per year    Marital Status: Widowed  Intimate Partner Violence: Not At Risk (05/17/2024)   Epic    Fear of Current or Ex-Partner: No    Emotionally Abused: No    Physically Abused: No    Sexually Abused: No  Depression (PHQ2-9): Low Risk (03/23/2024)   Depression (PHQ2-9)    PHQ-2 Score: 0  Alcohol Screen: Not on file  Housing: High Risk (05/17/2024)   Epic    Unable to Pay for Housing in the Last Year: Yes    Number of Times Moved in the Last Year: 0    Homeless in the Last Year: No  Utilities: Not At Risk (05/17/2024)   Epic    Threatened with loss of utilities: No  Health Literacy:  Not on file    Family History:    Family History  Problem Relation Age of Onset   Stroke Mother    Hyperlipidemia Mother    Cancer Father        mesothelioma   Diabetes Sister    Heart disease Brother    Hyperlipidemia Brother    Hypertension Brother    Stroke Brother    Hemochromatosis Son    Cancer Brother        bladder with mets to kidneys   Stroke Other    Hypertension Other    Hyperlipidemia Other    Cancer Other    Asthma Other    Heart attack Other      ROS:  Please see the history of present illness.   All other ROS reviewed and negative.     Physical Exam/Data: Vitals:   05/17/24 2127 05/17/24 2300 05/18/24 0135 05/18/24 0832  BP: 107/64 (!) 107/47 (!) 100/54   Pulse: (!) 107  76   Resp: 16  14   Temp: 97.7 F (36.5 C)     TempSrc: Oral     SpO2: 97%  98% 97%  Weight:      Height:        Intake/Output Summary (Last 24 hours) at 05/18/2024 1011 Last data filed at 05/18/2024 0600 Gross per 24 hour  Intake 55.53 ml  Output --  Net 55.53 ml      05/17/2024    7:01 PM 05/16/2024    3:30 PM 05/15/2024    2:32 PM  Last 3 Weights  Weight (lbs) 125 lb 125 lb 126 lb 8.7 oz  Weight (kg) 56.7 kg 56.7 kg 57.4 kg     Body mass index is 24.41 kg/m.  General:  elderly female in NAD HEENT: normal Neck: no JVD Vascular: No carotid bruits; Distal pulses 2+ bilaterally Cardiac:  irregular rhythm, regular rate, did not hear murmur Lungs:  clear to auscultation bilaterally, no wheezing, rhonchi or rales  Abd: soft, nontender, no  hepatomegaly  Ext: no edema Musculoskeletal:  No deformities, BUE and BLE strength normal and equal Skin: warm and dry  Neuro:  CNs 2-12 intact, no focal abnormalities noted Psych:  Normal affect   EKG:  The EKG was personally reviewed and demonstrates:  Afib with VR 119 Telemetry:  Telemetry was personally reviewed and demonstrates:  Afib with rates 60-100s  Relevant CV Studies:  Echo pending  Echo 03/2022:  1. Left  ventricular ejection fraction, by estimation, is 50 to 55%. The  left ventricle has low normal function. The left ventricle has no regional  wall motion abnormalities. The left ventricular internal cavity size was  mildly dilated. Left ventricular  diastolic parameters were normal. The average left ventricular global  longitudinal strain is -19.1 %. The global longitudinal strain is normal.   2. Right ventricular systolic function is normal. The right ventricular  size is normal. There is normal pulmonary artery systolic pressure.   3. The mitral valve is abnormal. Mild to moderate mitral valve  regurgitation. No evidence of mitral stenosis.   4. The aortic valve is tricuspid. There is mild calcification of the  aortic valve. There is mild thickening of the aortic valve. Aortic valve  regurgitation is trivial. Aortic valve sclerosis is present, with no  evidence of aortic valve stenosis.   5. The inferior vena cava is normal in size with greater than 50%  respiratory variability, suggesting right atrial pressure of 3 mmHg.    Laboratory Data: High Sensitivity Troponin:  No results for input(s): TROPONINIHS in the last 720 hours.  Recent Labs  Lab 05/17/24 1136  TRNPT 16      Chemistry Recent Labs  Lab 05/17/24 1136 05/17/24 1925 05/18/24 0501  NA 134*  --  134*  K 5.0  --  3.9  CL 97*  --  101  CO2 24  --  22  GLUCOSE 110*  --  94  BUN 18  --  16  CREATININE 1.26*  --  1.10*  CALCIUM  10.2  --  9.0  MG  --  2.1  --   GFRNONAA 42*  --  49*  ANIONGAP 13  --  11    Recent Labs  Lab 05/17/24 1136  PROT 7.3  ALBUMIN 4.5  AST 33  ALT 27  ALKPHOS 70  BILITOT 0.6   Lipids No results for input(s): CHOL, TRIG, HDL, LABVLDL, LDLCALC, CHOLHDL in the last 168 hours.  Hematology Recent Labs  Lab 05/17/24 1136  WBC 7.6  RBC 3.60*  HGB 11.7*  HCT 35.3*  MCV 98.1  MCH 32.5  MCHC 33.1  RDW 13.3  PLT 302   Thyroid   Recent Labs  Lab 05/18/24 0501  TSH  4.440    BNPNo results for input(s): BNP, PROBNP in the last 168 hours.  DDimer No results for input(s): DDIMER in the last 168 hours.  Radiology/Studies:  MR BRAIN WO CONTRAST Result Date: 05/17/2024 EXAM: MRI Brain Without Contrast 05/17/2024 06:58:00 PM TECHNIQUE: Multiplanar multisequence MRI of the head/brain was performed without the administration of intravenous contrast. COMPARISON: CT head from earlier today. CLINICAL HISTORY: Transient ischemic attack (TIA); Dizziness and headache FINDINGS: BRAIN AND VENTRICLES: No acute infarct. No intracranial hemorrhage. No mass. No midline shift. No hydrocephalus. The sella is unremarkable. Normal flow voids. Moderate T2 hyperintensities in the white matter, compatible with small vessel ischemic disease. ORBITS: No acute abnormality. SINUSES AND MASTOIDS: No acute abnormality. BONES AND SOFT TISSUES: Normal marrow signal. No acute soft tissue  abnormality. IMPRESSION: 1. No acute intracranial abnormality. 2. Moderate small vessel ischemic disease. Electronically signed by: Gilmore Molt 05/17/2024 10:06 PM EST RP Workstation: [id number]   DG Chest Portable 1 View Result Date: 05/17/2024 EXAM: XR Chest, 1 View(s). CLINICAL HISTORY: afib COMPARISON: 11/30/2022 FINDINGS: LUNGS: The lungs are clear. No consolidation. PLEURAL SPACES: No pleural effusion or pneumothorax. HEART: The heart size is normal. BONES: No acute osseous abnormality. IMPRESSION: 1. No acute cardiopulmonary abnormality. Electronically signed by: Lynwood Seip MD 05/17/2024 02:15 PM EST RP Workstation: [id number]   CT HEAD WO CONTRAST Result Date: 05/17/2024 EXAM: CT Head Without Intravenous Contrast. CLINICAL HISTORY: 83 year old female with acute neurologic deficit, stroke suspected, and headache and dizziness onset 0900 hours. TECHNIQUE: Axial computed tomography images of the head/brain without intravenous contrast. Dose reduction technique was used including one or more of the  following: automated exposure control, adjustment of mA and kV according to patient size, and/or iterative reconstruction. CONTRAST: Without. COMPARISON: Head CT 10/11/2014. FINDINGS: BRAIN: Brain volume within normal limits for age. 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 Calcified atherosclerosis at the skull base. No acute intraparenchymal hemorrhage. No mass lesion. No CT evidence for acute territorial infarct. No midline shift or extra-axial collection. No suspicious intracranial vascular hyperdensity. VENTRICLES: No hydrocephalus. ORBITS: The orbits are unremarkable. SINUSES AND MASTOIDS: Minor paranasal sinus mucosal thickening, regressed since the previous exam. Tympanic cavities and mastoids appear clear. SOFT TISSUES: No acute orbital scalp soft tissue finding. No radiopaque foreign body is seen. BONES: No acute skull fracture. IMPRESSION: 1. No acute intracranial abnormality. 2. Moderately advanced chronic small vessel disease. Electronically signed by: Helayne Hurst MD 05/17/2024 12:57 PM EST RP Workstation: [id number]     Assessment and Plan:  Atrial fibrillation with RVR - currently on cardizem gtt running at 5 mg/hr - titration limited by BP - telemetry with rates in the 70s, but into the 100s while talking, do not suspect we have good rate control with ambulation yet - BP borderline 100s/50s - given lung disease, would prefer to avoid BB if possible, would also not be a great long term candidate for amiodarone - currently on 5 mg/hr IV cardizem - could potentially transition this to 120 mg daily  - if echo stable, could entertain discharging with rate control and anticoagulation with plans for OP DCCV if she doesn't convert - alternatively, if not rate controlled on cardizem with ambulation, she is amenable to TEE-guided cardioversion this admission, would be Monday - digoxin  dosing could be entertained given BP, but CrCl 30, would need smaller doses and possibly every other day - TSH WNL, Mg 2.1 - no recent illnesses, UA negative for UTI, is not ill-appearing   Need for chronic anticoagulation - now on eliquis  2.5 mg BID dosed for age and weight - no bleeding issues   Hypertension / white coat hypertension - PTA: lopressor  25 mg BID - may consider stopping BB or transitioning to bisoprolol given pulmonary status   Emphysema / bronchiectasis - participates in pulmonary rehab - consider stopping BB given pulmonary status - following with pulmonology, never smoker   CKD III - sCr stable   I suspect we will not be able to rate control her with ambulation. Will stop cardizem gtt and start 120 mg cardizem PO. Hold off on digoxin for now. Will plan for TEE-DCCV on Monday.   Risk Assessment/Risk Scores:      CHA2DS2-VASc Score =  4   This indicates a 4.8% annual risk of stroke. The patient's score is based upon: CHF History: 0 HTN History: 0 Diabetes History: 0 Stroke History: 0 Vascular Disease History: 1 Age Score: 2 Gender Score: 1      For questions or updates, please contact  [facility] Please consult [email or web address] for contact info under      Signed, Jon Nat Hails, PA  05/18/2024 10:11 AM     [1]  Allergies Allergen Reactions   Codeine Other (See Comments)    hallucinations   Wheat Diarrhea    Celiac    Lactose Intolerance (Gi) Diarrhea   Milk (Cow)     Other reaction(s): Not available   Shrimp Extract Nausea And Vomiting   Tomato Diarrhea   Morphine  And Codeine Other (See Comments)    Unknown-family history of allergy   Tape Itching and Rash

## 2024-05-18 NOTE — TOC Initial Note (Signed)
 Transition of Care [facility]) - Initial/Assessment Note    Patient Details  Name: Ashley Mayer MRN: [id number] Date of Birth: 1941/01/08  [facility] CM/SW Contact:    Bascom Service, RN Phone Number: 05/18/2024, 9:55 AM  Clinical Narrative:  Spoke to Heath(Son) d/c plan home. Has own transport.                 Expected Discharge Plan: Home/Self Care Barriers to Discharge: Continued Medical Work up   Patient Goals and CMS Choice Patient states their goals for this hospitalization and ongoing recovery are:: Home CMS Medicare.gov Compare Post Acute Care list provided to:: Patient Represenative (must comment) Choice offered to / list presented to : Adult Children [place] ownership interest in [facility] [address].provided to:: Adult Children    Expected Discharge Plan and Services   Discharge Planning Services: CM Consult Post Acute Care Choice: Resumption of Svcs/PTA Provider Living arrangements for the past 2 months: Single Family Home                                      Prior Living Arrangements/Services Living arrangements for the past 2 months: Single Family Home Lives with:: Self   Do you feel safe going back to the place where you live?: Yes               Activities of Daily Living   ADL Screening (condition at time of admission) Independently performs ADLs?: Yes (appropriate for developmental age) Is the patient deaf or have difficulty hearing?: No Does the patient have difficulty seeing, even when wearing glasses/contacts?: No Does the patient have difficulty concentrating, remembering, or making decisions?: No  Permission Sought/Granted Permission sought to share information with : Case Manager                Emotional Assessment              Admission diagnosis:  TIA (transient ischemic attack) [G45.9] Atrial fibrillation with RVR (HCC) [I48.91] Patient Active Problem List   Diagnosis Date Noted   Atrial  fibrillation with RVR (HCC) 05/17/2024   Dizziness 05/17/2024   Pulmonary emphysema (HCC) 05/17/2024   Osteopenia 06/19/2015   History of traumatic brain injury 10/11/2014   Vitamin D  deficiency 08/15/2013   Other abnormal glucose 08/15/2013   Medication management 08/15/2013   Mixed hyperlipidemia 12/21/2007   Essential hypertension 12/21/2007   Diverticulosis of large intestine 12/21/2007   IBS (irritable bowel syndrome) 12/21/2007   PCP:  Rolinda Millman, MD Pharmacy:   [facility] [id number]), [place] - [address] [place] ([place]) [place] [address] Phone: [phone] Fax: [phone]  [place] [facility] [address] [place] [address] Phone: [phone] Fax: [phone]     Social Drivers of Health (SDOH) Social History: SDOH Screenings   Food Insecurity: No Food Insecurity (05/17/2024)  Housing: High Risk (05/17/2024)  Transportation Needs: Unmet Transportation Needs (05/17/2024)  Utilities: Not At Risk (05/17/2024)  Depression (PHQ2-9): Low Risk (03/23/2024)  Social Connections: Moderately Integrated (05/17/2024)  Tobacco Use: Low Risk (05/17/2024)   SDOH Interventions:     Readmission Risk Interventions     No data to display

## 2024-05-19 DIAGNOSIS — I5021 Acute systolic (congestive) heart failure: Secondary | ICD-10-CM

## 2024-05-19 DIAGNOSIS — I10 Essential (primary) hypertension: Secondary | ICD-10-CM | POA: Diagnosis not present

## 2024-05-19 DIAGNOSIS — I4891 Unspecified atrial fibrillation: Secondary | ICD-10-CM | POA: Diagnosis not present

## 2024-05-19 LAB — BASIC METABOLIC PANEL WITH GFR
Anion gap: 11 (ref 5–15)
BUN: 14 mg/dL (ref 8–23)
CO2: 23 mmol/L (ref 22–32)
Calcium: 9.2 mg/dL (ref 8.9–10.3)
Chloride: 105 mmol/L (ref 98–111)
Creatinine, Ser: 1.11 mg/dL — ABNORMAL HIGH (ref 0.44–1.00)
GFR, Estimated: 49 mL/min — ABNORMAL LOW (ref >60)
Glucose, Bld: 91 mg/dL (ref 70–99)
Potassium: 3.9 mmol/L (ref 3.5–5.1)
Sodium: 140 mmol/L (ref 135–145)

## 2024-05-19 LAB — MAGNESIUM: Magnesium: 2.5 mg/dL — ABNORMAL HIGH (ref 1.7–2.4)

## 2024-05-19 MED ORDER — METOPROLOL TARTRATE 25 MG PO TABS
25.0000 mg | ORAL_TABLET | Freq: Two times a day (BID) | ORAL | Status: DC
  Administered 2024-05-19 – 2024-05-20 (×3): 25 mg via ORAL
  Filled 2024-05-19 (×3): qty 1

## 2024-05-19 NOTE — Plan of Care (Signed)
   Problem: Education: Goal: Knowledge of General Education information will improve Description Including pain rating scale, medication(s)/side effects and non-pharmacologic comfort measures Outcome: Progressing

## 2024-05-19 NOTE — Progress Notes (Signed)
 DAILY PROGRESS NOTE   Patient Name: Ashley Mayer Date of Encounter: 05/19/2024 Cardiologist: Shelda Bruckner, MD  Chief Complaint   Scalp numbness  Patient Profile   Ashley Mayer is a 83 y.o. female with a hx of HTN/?white coat hypertension, HLD, mild to moderate MR, CKD III, HLD, celliac, emphysema/bronchiectasis, and newly recognized Afib who is being seen 05/18/2024 for the evaluation of Afib RVR at the request of Dr. Austria.   Subjective   Continues with afib and RVR overnight. Echo showed LVEF 45-50% with global hypokinesis. There is mild RV systolic dysfunction and mild LAE with moderate RAE.  C/o some dizziness, which sounds like BPPV - also has some scalp numbness/headache - CT/MRI unremarkable. Had prior head trauma.   Objective   Vitals:   05/18/24 1409 05/18/24 1938 05/19/24 0409 05/19/24 0815  BP: (!) 107/56 119/73 124/75   Pulse: 92 89 97   Resp: (!) 22 17 15    Temp: 97.7 F (36.5 C) 98.2 F (36.8 C) 98.5 F (36.9 C)   TempSrc: Oral     SpO2:  98% 98% 97%  Weight:      Height:        Intake/Output Summary (Last 24 hours) at 05/19/2024 9173 Last data filed at 05/18/2024 1816 Gross per 24 hour  Intake 480 ml  Output --  Net 480 ml   Filed Weights   05/17/24 1901  Weight: 56.7 kg    Physical Exam   General appearance: alert and no distress Lungs: clear to auscultation bilaterally Heart: irregularly irregular rhythm Extremities: extremities normal, atraumatic, no cyanosis or edema Neurologic: Grossly normal  Inpatient Medications    Scheduled Meds:  apixaban   2.5 mg Oral BID   diltiazem   120 mg Oral Daily   rosuvastatin   20 mg Oral Daily   umeclidinium bromide   1 puff Inhalation Daily    Continuous Infusions:   PRN Meds: acetaminophen  **OR** acetaminophen , albuterol , pantoprazole , polyethylene glycol   Labs   Results for orders placed or performed during the hospital encounter of 05/17/24 (from the past 48  hours)  CBG monitoring, ED     Status: None   Collection Time: 05/17/24 11:27 AM  Result Value Ref Range   Glucose-Capillary 95 70 - 99 mg/dL    Comment: Glucose reference range applies only to samples taken after fasting for at least 8 hours.  Protime-INR     Status: Abnormal   Collection Time: 05/17/24 11:36 AM  Result Value Ref Range   Prothrombin Time 15.9 (H) 11.4 - 15.2 seconds   INR 1.2 0.8 - 1.2    Comment: (NOTE) INR goal varies based on device and disease states. Performed at [facility], [address], [place], [place] [address]   CBC     Status: Abnormal   Collection Time: 05/17/24 11:36 AM  Result Value Ref Range   WBC 7.6 4.0 - 10.5 K/uL   RBC 3.60 (L) 3.87 - 5.11 MIL/uL   Hemoglobin 11.7 (L) 12.0 - 15.0 g/dL   HCT 64.6 (L) 63.9 - 53.9 %   MCV 98.1 80.0 - 100.0 fL   MCH 32.5 26.0 - 34.0 pg   MCHC 33.1 30.0 - 36.0 g/dL   RDW 86.6 88.4 - 84.4 %   Platelets 302 150 - 400 K/uL   nRBC 0.0 0.0 - 0.2 %    Comment: Performed at [facility], [address] [place], [place] [address]  Differential     Status: None  Collection Time: 05/17/24 11:36 AM  Result Value Ref Range   Neutrophils Relative % 76 %   Neutro Abs 5.8 1.7 - 7.7 K/uL   Lymphocytes Relative 13 %   Lymphs Abs 1.0 0.7 - 4.0 K/uL   Monocytes Relative 7 %   Monocytes Absolute 0.5 0.1 - 1.0 K/uL   Eosinophils Relative 3 %   Eosinophils Absolute 0.2 0.0 - 0.5 K/uL   Basophils Relative 1 %   Basophils Absolute 0.0 0.0 - 0.1 K/uL   Immature Granulocytes 0 %   Abs Immature Granulocytes 0.02 0.00 - 0.07 K/uL    Comment: Performed at [facility], [address], [place], [place] [address]  Comprehensive metabolic panel     Status: Abnormal   Collection Time: 05/17/24 11:36 AM  Result Value Ref Range   Sodium 134 (L) 135 - 145 mmol/L   Potassium 5.0 3.5 - 5.1 mmol/L   Chloride 97 (L) 98 - 111 mmol/L   CO2 24 22 - 32 mmol/L   Glucose, Bld 110  (H) 70 - 99 mg/dL    Comment: Glucose reference range applies only to samples taken after fasting for at least 8 hours.   BUN 18 8 - 23 mg/dL   Creatinine, Ser 8.73 (H) 0.44 - 1.00 mg/dL   Calcium  10.2 8.9 - 10.3 mg/dL   Total Protein 7.3 6.5 - 8.1 g/dL   Albumin 4.5 3.5 - 5.0 g/dL   AST 33 15 - 41 U/L   ALT 27 0 - 44 U/L   Alkaline Phosphatase 70 38 - 126 U/L   Total Bilirubin 0.6 0.0 - 1.2 mg/dL   GFR, Estimated 42 (L) >60 mL/min    Comment: (NOTE) Calculated using the CKD-EPI Creatinine Equation (2021)    Anion gap 13 5 - 15    Comment: Performed at [facility], [address], [place], [place] [address]  Troponin T, High Sensitivity     Status: None   Collection Time: 05/17/24 11:36 AM  Result Value Ref Range   Troponin T High Sensitivity 16 0 - 19 ng/L    Comment: (NOTE) Biotin concentrations > 1000 ng/mL falsely decrease TnT results.  Serial cardiac troponin measurements are suggested.  Refer to the Links section for chest pain algorithms and additional  guidance. Performed at [facility], [address] [place], [place] [address]   Urinalysis, Routine w reflex microscopic -Urine, Clean Catch     Status: Abnormal   Collection Time: 05/17/24 12:07 PM  Result Value Ref Range   Color, Urine COLORLESS (A) YELLOW   APPearance CLEAR CLEAR   Specific Gravity, Urine 1.007 1.005 - 1.030   pH 7.0 5.0 - 8.0   Glucose, UA NEGATIVE NEGATIVE mg/dL   Hgb urine dipstick NEGATIVE NEGATIVE   Bilirubin Urine NEGATIVE NEGATIVE   Ketones, ur NEGATIVE NEGATIVE mg/dL   Protein, ur NEGATIVE NEGATIVE mg/dL   Nitrite NEGATIVE NEGATIVE   Leukocytes,Ua NEGATIVE NEGATIVE    Comment: Performed at [facility], [address] [place], [place] [address]  Resp panel by RT-PCR (RSV, Flu A&B, Covid) Anterior Nasal Swab     Status: None   Collection Time: 05/17/24 12:07 PM   Specimen: Anterior Nasal Swab  Result Value Ref Range   SARS  Coronavirus 2 by RT PCR NEGATIVE NEGATIVE    Comment: (NOTE) SARS-CoV-2 target nucleic acids are NOT DETECTED.  The SARS-CoV-2 RNA is generally detectable in upper respiratory specimens during the acute phase of infection. The  lowest concentration of SARS-CoV-2 viral copies this assay can detect is 138 copies/mL. A negative result does not preclude SARS-Cov-2 infection and should not be used as the sole basis for treatment or other patient management decisions. A negative result may occur with  improper specimen collection/handling, submission of specimen other than nasopharyngeal swab, presence of viral mutation(s) within the areas targeted by this assay, and inadequate number of viral copies(<138 copies/mL). A negative result must be combined with clinical observations, patient history, and epidemiological information. The expected result is Negative.  Fact Sheet for Patients:  bloggercourse.com  Fact Sheet for Healthcare Providers:  seriousbroker.it  This test is no t yet approved or cleared by the [place]  FDA and  has been authorized for detection and/or diagnosis of SARS-CoV-2 by FDA under an Emergency Use Authorization (EUA). This EUA will remain  in effect (meaning this test can be used) for the duration of the COVID-19 declaration under Section 564(b)(1) of the Act, 21 U.S.C.section 360bbb-3(b)(1), unless the authorization is terminated  or revoked sooner.       Influenza A by PCR NEGATIVE NEGATIVE   Influenza B by PCR NEGATIVE NEGATIVE    Comment: (NOTE) The Xpert Xpress SARS-CoV-2/FLU/RSV plus assay is intended as an aid in the diagnosis of influenza from Nasopharyngeal swab specimens and should not be used as a sole basis for treatment. Nasal washings and aspirates are unacceptable for Xpert Xpress SARS-CoV-2/FLU/RSV testing.  Fact Sheet for Patients: bloggercourse.com  Fact Sheet  for Healthcare Providers: seriousbroker.it  This test is not yet approved or cleared by the [place]  FDA and has been authorized for detection and/or diagnosis of SARS-CoV-2 by FDA under an Emergency Use Authorization (EUA). This EUA will remain in effect (meaning this test can be used) for the duration of the COVID-19 declaration under Section 564(b)(1) of the Act, 21 U.S.C. section 360bbb-3(b)(1), unless the authorization is terminated or revoked.     Resp Syncytial Virus by PCR NEGATIVE NEGATIVE    Comment: (NOTE) Fact Sheet for Patients: bloggercourse.com  Fact Sheet for Healthcare Providers: seriousbroker.it  This test is not yet approved or cleared by the [place]  FDA and has been authorized for detection and/or diagnosis of SARS-CoV-2 by FDA under an Emergency Use Authorization (EUA). This EUA will remain in effect (meaning this test can be used) for the duration of the COVID-19 declaration under Section 564(b)(1) of the Act, 21 U.S.C. section 360bbb-3(b)(1), unless the authorization is terminated or revoked.  Performed at [facility], [address] [place], [place] [address]   Urine Drug Screen     Status: None   Collection Time: 05/17/24  1:05 PM  Result Value Ref Range   Opiates NEGATIVE NEGATIVE   Cocaine NEGATIVE NEGATIVE   Benzodiazepines NEGATIVE NEGATIVE   Amphetamines NEGATIVE NEGATIVE   Tetrahydrocannabinol NEGATIVE NEGATIVE   Barbiturates NEGATIVE NEGATIVE   Methadone Scn, Ur NEGATIVE NEGATIVE   Fentanyl NEGATIVE NEGATIVE    Comment: (NOTE) Drug screen is for Medical Purposes only. Positive results are preliminary only. If confirmation is needed, notify lab within 5 days.  Drug Class                 Cutoff (ng/mL) Amphetamine and metabolites 1000 Barbiturate and metabolites 200 Benzodiazepine              200 Opiates and metabolites      300 Cocaine and metabolites     300 THC  50 Fentanyl                    5 Methadone                   300  Trazodone is metabolized in vivo to several metabolites,  including pharmacologically active m-CPP, which is excreted in the  urine.  Immunoassay screens for amphetamines and MDMA have potential  cross-reactivity with these compounds and may provide false positive  result.  Performed at [facility], [address] [place], [place] [address]   Magnesium     Status: None   Collection Time: 05/17/24  7:25 PM  Result Value Ref Range   Magnesium 2.1 1.7 - 2.4 mg/dL    Comment: Performed at [facility], [address], [place], [place] [address]  Basic metabolic panel     Status: Abnormal   Collection Time: 05/18/24  5:01 AM  Result Value Ref Range   Sodium 134 (L) 135 - 145 mmol/L   Potassium 3.9 3.5 - 5.1 mmol/L    Comment: Delta check noted    Chloride 101 98 - 111 mmol/L   CO2 22 22 - 32 mmol/L   Glucose, Bld 94 70 - 99 mg/dL    Comment: Glucose reference range applies only to samples taken after fasting for at least 8 hours.   BUN 16 8 - 23 mg/dL   Creatinine, Ser 8.89 (H) 0.44 - 1.00 mg/dL   Calcium  9.0 8.9 - 10.3 mg/dL   GFR, Estimated 49 (L) >60 mL/min    Comment: (NOTE) Calculated using the CKD-EPI Creatinine Equation (2021)    Anion gap 11 5 - 15    Comment: Performed at [facility], [address], [place], [place] [address]  TSH     Status: None   Collection Time: 05/18/24  5:01 AM  Result Value Ref Range   TSH 4.440 0.350 - 4.500 uIU/mL    Comment: Performed at [facility], [address], [place], [place] [address]  Basic metabolic panel with GFR     Status: Abnormal   Collection Time: 05/19/24  5:42 AM  Result Value Ref Range   Sodium 140 135 - 145 mmol/L   Potassium 3.9 3.5 - 5.1 mmol/L   Chloride 105 98 - 111 mmol/L   CO2 23 22 - 32 mmol/L    Glucose, Bld 91 70 - 99 mg/dL    Comment: Glucose reference range applies only to samples taken after fasting for at least 8 hours.   BUN 14 8 - 23 mg/dL   Creatinine, Ser 8.88 (H) 0.44 - 1.00 mg/dL   Calcium  9.2 8.9 - 10.3 mg/dL   GFR, Estimated 49 (L) >60 mL/min    Comment: (NOTE) Calculated using the CKD-EPI Creatinine Equation (2021)    Anion gap 11 5 - 15    Comment: Performed at [facility], [address] [place], [place] [address]  Magnesium     Status: Abnormal   Collection Time: 05/19/24  5:42 AM  Result Value Ref Range   Magnesium 2.5 (H) 1.7 - 2.4 mg/dL    Comment: Performed at [facility], [address], [place], [place] [address]    ECG   N/A  Telemetry   Afib with RVR - Personally Reviewed  Radiology    ECHOCARDIOGRAM COMPLETE Result Date: 05/18/2024    ECHOCARDIOGRAM REPORT   Patient Name:   [facility]  Date of Exam: 05/18/2024 Medical Rec #:  [id number]          Height:       60.0 in Accession #:    [id number]         Weight:       125.0 lb Date of Birth:  1941-03-03          BSA:          1.529 m Patient Age:    83 years           BP:           108/65 mmHg Patient Gender: F                  HR:           100 bpm. Exam Location:  Inpatient Procedure: 2D Echo, Cardiac Doppler and Color Doppler (Both Spectral and Color            Flow Doppler were utilized during procedure). Indications:    Atrial Fibrillation I48.91  History:        Patient has prior history of Echocardiogram examinations, most                 recent 03/22/2022. CKD, Arrythmias:Atrial Fibrillation; Risk                 Factors:Dyslipidemia and Hypertension.  Sonographer:    Koleen Popper RDCS Referring Phys: [id number] KELLY A GRIFFITH  Sonographer Comments: Image acquisition challenging due to patient body habitus. IMPRESSIONS  1. Left ventricular ejection fraction, by estimation, is 45 to 50%. The left ventricle has mildly decreased function. The left  ventricle demonstrates global hypokinesis. Left ventricular diastolic parameters are indeterminate.  2. Right ventricular systolic function is mildly reduced. The right ventricular size is mildly enlarged. There is mildly elevated pulmonary artery systolic pressure. The estimated right ventricular systolic pressure is 43.3 mmHg.  3. Left atrial size was mildly dilated.  4. Right atrial size was moderately dilated.  5. The mitral valve is abnormal. Mild to moderate mitral valve regurgitation. No evidence of mitral stenosis.  6. Tricuspid valve regurgitation is mild to moderate.  7. The aortic valve is tricuspid. There is moderate calcification of the aortic valve. Aortic valve regurgitation is not visualized. No aortic stenosis is present.  8. The inferior vena cava is normal in size with <50% respiratory variability, suggesting right atrial pressure of 8 mmHg.  9. The patient was in atrial fibrillation. FINDINGS  Left Ventricle: Left ventricular ejection fraction, by estimation, is 45 to 50%. The left ventricle has mildly decreased function. The left ventricle demonstrates global hypokinesis. The left ventricular internal cavity size was normal in size. There is  no left ventricular hypertrophy. Left ventricular diastolic parameters are indeterminate. Right Ventricle: The right ventricular size is mildly enlarged. No increase in right ventricular wall thickness. Right ventricular systolic function is mildly reduced. There is mildly elevated pulmonary artery systolic pressure. The tricuspid regurgitant  velocity is 2.97 m/s, and with an assumed right atrial pressure of 8 mmHg, the estimated right ventricular systolic pressure is 43.3 mmHg. Left Atrium: Left atrial size was mildly dilated. Right Atrium: Right atrial size was moderately dilated. Pericardium: There is no evidence of pericardial effusion. Mitral Valve: The mitral valve is abnormal. There is mild calcification of the mitral valve leaflet(s). Mild mitral  annular calcification. Mild to moderate mitral valve regurgitation. No evidence of mitral valve stenosis. Tricuspid Valve: The tricuspid valve is normal in structure.  Tricuspid valve regurgitation is mild to moderate. Aortic Valve: The aortic valve is tricuspid. There is moderate calcification of the aortic valve. Aortic valve regurgitation is not visualized. No aortic stenosis is present. Pulmonic Valve: The pulmonic valve was normal in structure. Pulmonic valve regurgitation is not visualized. Aorta: The aortic root is normal in size and structure. Venous: The inferior vena cava is normal in size with less than 50% respiratory variability, suggesting right atrial pressure of 8 mmHg. IAS/Shunts: No atrial level shunt detected by color flow Doppler.  LEFT VENTRICLE PLAX 2D LVIDd:         3.90 cm LVIDs:         2.90 cm LV PW:         0.70 cm LV IVS:        0.80 cm LVOT diam:     1.80 cm LV SV:         39 LV SV Index:   25 LVOT Area:     2.54 cm  LV Volumes (MOD) LV vol d, MOD A2C: 58.9 ml LV vol s, MOD A2C: 26.8 ml LV SV MOD A2C:     32.1 ml RIGHT VENTRICLE          IVC RV Basal diam:  3.60 cm  IVC diam: 2.00 cm TAPSE (M-mode): 1.5 cm LEFT ATRIUM             Index        RIGHT ATRIUM           Index LA diam:        4.00 cm 2.62 cm/m   RA Area:     16.30 cm LA Vol (A2C):   30.1 ml 19.69 ml/m  RA Volume:   41.40 ml  27.08 ml/m LA Vol (A4C):   33.0 ml 21.59 ml/m LA Biplane Vol: 33.5 ml 21.92 ml/m  AORTIC VALVE LVOT Vmax:   79.60 cm/s LVOT Vmean:  52.000 cm/s LVOT VTI:    0.153 m  AORTA Ao Root diam: 2.60 cm Ao Asc diam:  3.20 cm MR Peak grad: 70.8 mmHg   TRICUSPID VALVE MR Mean grad: 46.0 mmHg   TR Peak grad:   35.3 mmHg MR Vmax:      420.75 cm/s TR Mean grad:   25.0 mmHg MR Vmean:     322.0 cm/s  TR Vmax:        297.00 cm/s                           TR Vmean:       244.0 cm/s                            SHUNTS                           Systemic VTI:  0.15 m                           Systemic Diam: 1.80 cm Dalton  McleanMD Electronically signed by Ezra Kanner Signature Date/Time: 05/18/2024/4:15:11 PM    Final    MR BRAIN WO CONTRAST Result Date: 05/17/2024 EXAM: MRI Brain Without Contrast 05/17/2024 06:58:00 PM TECHNIQUE: Multiplanar multisequence MRI of the head/brain was performed without the administration of intravenous contrast. COMPARISON: CT head from earlier today. CLINICAL HISTORY: Transient ischemic attack (TIA);  Dizziness and headache FINDINGS: BRAIN AND VENTRICLES: No acute infarct. No intracranial hemorrhage. No mass. No midline shift. No hydrocephalus. The sella is unremarkable. Normal flow voids. Moderate T2 hyperintensities in the white matter, compatible with small vessel ischemic disease. ORBITS: No acute abnormality. SINUSES AND MASTOIDS: No acute abnormality. BONES AND SOFT TISSUES: Normal marrow signal. No acute soft tissue abnormality. IMPRESSION: 1. No acute intracranial abnormality. 2. Moderate small vessel ischemic disease. Electronically signed by: Gilmore Molt 05/17/2024 10:06 PM EST RP Workstation: [id number]   DG Chest Portable 1 View Result Date: 05/17/2024 EXAM: XR Chest, 1 View(s). CLINICAL HISTORY: afib COMPARISON: 11/30/2022 FINDINGS: LUNGS: The lungs are clear. No consolidation. PLEURAL SPACES: No pleural effusion or pneumothorax. HEART: The heart size is normal. BONES: No acute osseous abnormality. IMPRESSION: 1. No acute cardiopulmonary abnormality. Electronically signed by: Lynwood Seip MD 05/17/2024 02:15 PM EST RP Workstation: [id number]   CT HEAD WO CONTRAST Result Date: 05/17/2024 EXAM: CT Head Without Intravenous Contrast. CLINICAL HISTORY: 83 year old female with acute neurologic deficit, stroke suspected, and headache and dizziness onset 0900 hours. TECHNIQUE: Axial computed tomography images of the head/brain without intravenous contrast. Dose reduction technique was used including one or more of the following: automated exposure control, adjustment of mA and kV  according to patient size, and/or iterative reconstruction. CONTRAST: Without. COMPARISON: Head CT 10/11/2014. FINDINGS: BRAIN: Brain volume within normal limits for age. 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 Calcified atherosclerosis at the skull base. No acute intraparenchymal hemorrhage. No mass lesion. No CT evidence for acute territorial infarct. No midline shift or extra-axial collection. No suspicious intracranial vascular hyperdensity. VENTRICLES: No hydrocephalus. ORBITS: The orbits are unremarkable. SINUSES AND MASTOIDS: Minor paranasal sinus mucosal thickening, regressed since the previous exam. Tympanic cavities and mastoids appear clear. SOFT TISSUES: No acute orbital scalp soft tissue finding. No radiopaque foreign body is seen. BONES: No acute skull fracture. IMPRESSION: 1. No acute intracranial abnormality. 2. Moderately advanced chronic small vessel disease. Electronically signed by: Helayne Hurst MD 05/17/2024 12:57 PM EST RP Workstation: [id number]    Cardiac Studies   See echo above  Assessment   Principal Problem:   Atrial fibrillation with RVR (HCC) Active Problems:   Mixed hyperlipidemia   Essential hypertension   Dizziness   Pulmonary emphysema (HCC)   Plan   AFib with RVR at times today. Switch to oral diltiazem  yesterday. Echo shows mildly reduced LVEF 45-50%. Will add metoprolol  tartrate 25 mg BID. Continue dilt for now, but consider weaning off after DCCV given systolic HF. Plan for TEE/DCCV on Monday. Would keep her until then to titrate meds. Consider adding SGLT2i tomorrow if rate control improved.  Time Spent Directly with Patient:  I have spent a total of 25 minutes with the patient reviewing hospital notes, telemetry, EKGs, labs and examining the patient as well as establishing an assessment and plan that was discussed personally with the patient.  >  50% of time was spent in direct patient care.  Length of Stay:  LOS: 0 days   Vinie KYM Maxcy, MD, [facility], FNLA, FACP  [place]  [facility]  Medical Director of the [facility]id [facility]  Cardiovascular Risk Reduction [facility] Diplomate of the American Board of Clinical Lipidology Attending Cardiologist  Direct Dial: [phone]  Fax: [phone]  Website:  [email or web address] Vinie BROCKS Rayshawn Visconti 05/19/2024, 8:26 AM

## 2024-05-19 NOTE — Plan of Care (Signed)
  Problem: Education: Goal: Knowledge of General Education information will improve Description: Including pain rating scale, medication(s)/side effects and non-pharmacologic comfort measures Outcome: Progressing   Problem: Clinical Measurements: Goal: Ability to maintain clinical measurements within normal limits will improve Outcome: Progressing   Problem: Coping: Goal: Level of anxiety will decrease Outcome: Progressing   Problem: Safety: Goal: Ability to remain free from injury will improve Outcome: Progressing   Problem: Skin Integrity: Goal: Risk for impaired skin integrity will decrease Outcome: Progressing

## 2024-05-19 NOTE — Progress Notes (Signed)
 PROGRESS NOTE    Ashley Mayer  FMW:[id number] DOB: [birth year] DOA: 05/17/2024 PCP: Rolinda Millman, MD    Brief Narrative:   Ashley Mayer is a 83 y.o. female with past medical history significant for paroxysmal atrial fibrillation recently diagnosed and started on metoprolol  and Eliquis  by cardiology, HTN, HLD, peripheral vascular disease, emphysema/bronchiectasis who presented to [facility] ED 05/17/2024 for evaluation of dizziness, shortness of breath, and headache.  Reports onset around 9 AM in which feels similar to previous vertigo spells as well as having difficulty catching her breath.  Patient reports spells in the past typically resolve quickly.  Now her symptoms presenting more frequently and longer in duration.  Denies vision changes, no focal weakness, no paresthesias, no difficulty with speaking or swallowing.  Patient was at pulmonary rehab on 12/9 where she was found to be in A-fib with RVR with HR in 130s to 140s. Pt was reportedly asymptomatic at that time without palpitations, dizziness / lightheadedness, dyspnea or chest pain. She was seen by Cardiology in clinic yesterday where she was feeling a little dizzy, found with elevated heart rate again and given oral Lopressor .   In the ED, temperature 97.9 F, HR 112, RR 20, BP 133/84, SpO2 96% on room air.  WBC 7.6, hemoglobin 11.7, platelet count 302.  Sodium 134, potassium 3.9, chloride 101, CO2 22, glucose 94, BUN 16, creat 1.10.  AST 33, ALT 27, total Ruben 0.6.  High-sensitivity troponin 16.  COVID/influenza/RSV PCR negative.  Urinalysis unrevealing.  UDS negative.  CT head without contrast with no acute intracranial [place]Mal[place], moderate manage chronic small vessel disease.  MRI brain without contrast with no acute intracranial malady, moderate small vessel ischemic disease.  EKG with atrial fibrillation with RVR, rate 125.  Patient was started on Cardizem  drip.  Cardiology consulted.  TRH consulted for  admission and patient was transferred to [facility] for further evaluation and management.   Assessment & Plan:   Paroxysmal atrial fibrillation with RVR HTN Patient presenting with dizziness, headache and shortness of breath.  Recently diagnosed with atrial fibrillation 2 days prior to pulmonary rehab and recently started on Eliquis  and Lopressor .  On arrival to the ED patient's heart rates in the 130s-140s.  Patient was started on a Cardizem  drip.  Troponin 16, urinalysis unrevealing.  UDS negative.  TSH 4.440, within normal limits.  TTE with LVEF 45-50%, LV with mildly decreased function with global hypokinesis, diastolic parameters indeterminate, RV systolic function mildly reduced, biatrial enlargement, IVC normal in size, no AR stenosis. -- Cardiology following, appreciate assistance -- Cardizem  drip now transition to Cardizem  120 mg p.o. daily (ok to continue per cards even w/ LVEF 45-50%) -- Started on metoprolol  tartrate 25 mg p.o. twice daily today -- Avoiding beta-blockers due to emphysema/bronchiectasis -- Eliquis  2.5 g p.o. twice daily -- Continue to monitor on telemetry, plans for TEE/cardioversion on Monday 12/15  Emphysema/bronchiectasis Follows with pulmonology outpatient.  -- Incruse Ellipta  1 puff daily -- Albuterol  neb every 6 hours.  Wheezing/shortness of breath -- Resume pulmonary rehab on discharge  CKD stage IIIa -- Cr 1.26>1.10>1.11; stable  HLD -- Crestor  10 mg p.o. daily  Peripheral vascular disease Recent ABI abnormal.  Outpatient follow-up with vascular surgery.   DVT prophylaxis: apixaban  (ELIQUIS ) tablet 2.5 mg Start: 05/17/24 2200 apixaban  (ELIQUIS ) tablet 2.5 mg    Code Status: Full Code Family Communication: Updated family present at bedside this morning  Disposition Plan:  Level of care: Telemetry Status is: Observation The patient  remains OBS appropriate and will d/c before 2 midnights.    Consultants:  Cardiology  Procedures:   TTE:  TEE/cardioversion: Pending on 12/15  Antimicrobials:  None   Subjective: Patient seen examined bedside, lying in bed.  Family present.  Discussed with cardiology, Dr. Mona this morning regarding mildly decreased LVEF, okay to continue Cardizem  and will add metoprolol  20 mg p.o. twice daily.  Current plan for TEE and cardioversion on Monday, 05/21/2024.  No other questions or concerns at this time.  Currently denies headache, no visual changes, no chest pain, no palpitations, no current shortness of breath, no current dizziness, no fever/chills/night sweats, no nausea/vomiting/diarrhea, no focal weakness, no fatigue, no paresthesias.  No acute events overnight per nursing staff.  Objective: Vitals:   05/18/24 1409 05/18/24 1938 05/19/24 0409 05/19/24 0815  BP: (!) 107/56 119/73 124/75   Pulse: 92 89 97   Resp: (!) 22 17 15    Temp: 97.7 F (36.5 C) 98.2 F (36.8 C) 98.5 F (36.9 C)   TempSrc: Oral     SpO2:  98% 98% 97%  Weight:      Height:        Intake/Output Summary (Last 24 hours) at 05/19/2024 1319 Last data filed at 05/18/2024 1816 Gross per 24 hour  Intake 240 ml  Output --  Net 240 ml   Filed Weights   05/17/24 1901  Weight: 56.7 kg    Examination:  Physical Exam: GEN: NAD, alert and oriented x 3, wd/wn HEENT: NCAT, PERRL, EOMI, sclera clear, MMM PULM: CTAB w/o wheezes/crackles, normal respiratory effort, on room air CV: Irregularly irregular rhythm, normal rate w/o M/G/R GI: abd soft, NTND, NABS, no R/G/M MSK: no peripheral edema, muscle strength globally intact 5/5 bilateral upper/lower extremities NEURO: CN II-XII intact, no focal deficits, sensation to light touch intact PSYCH: normal mood/affect Integumentary: dry/intact, no rashes or wounds    Data Reviewed: I have personally reviewed following labs and imaging studies  CBC: Recent Labs  Lab 05/17/24 1136  WBC 7.6  NEUTROABS 5.8  HGB 11.7*  HCT 35.3*  MCV 98.1  PLT 302   Basic  Metabolic Panel: Recent Labs  Lab 05/17/24 1136 05/17/24 1925 05/18/24 0501 05/19/24 0542  NA 134*  --  134* 140  K 5.0  --  3.9 3.9  CL 97*  --  101 105  CO2 24  --  22 23  GLUCOSE 110*  --  94 91  BUN 18  --  16 14  CREATININE 1.26*  --  1.10* 1.11*  CALCIUM  10.2  --  9.0 9.2  MG  --  2.1  --  2.5*   GFR: Estimated Creatinine Clearance: 29.8 mL/min (A) (by C-G formula based on SCr of 1.11 mg/dL (H)). Liver Function Tests: Recent Labs  Lab 05/17/24 1136  AST 33  ALT 27  ALKPHOS 70  BILITOT 0.6  PROT 7.3  ALBUMIN 4.5   No results for input(s): LIPASE, AMYLASE in the last 168 hours. No results for input(s): AMMONIA in the last 168 hours. Coagulation Profile: Recent Labs  Lab 05/17/24 1136  INR 1.2   Cardiac Enzymes: No results for input(s): CKTOTAL, CKMB, CKMBINDEX, TROPONINI in the last 168 hours. BNP (last 3 results) No results for input(s): PROBNP in the last 8760 hours. HbA1C: No results for input(s): HGBA1C in the last 72 hours. CBG: Recent Labs  Lab 05/17/24 1127  GLUCAP 95   Lipid Profile: No results for input(s): CHOL, HDL, LDLCALC, TRIG, CHOLHDL, LDLDIRECT  in the last 72 hours. Thyroid  Function Tests: Recent Labs    05/18/24 0501  TSH 4.440   Anemia Panel: No results for input(s): VITAMINB12, FOLATE, FERRITIN, TIBC, IRON, RETICCTPCT in the last 72 hours. Sepsis Labs: No results for input(s): PROCALCITON, LATICACIDVEN in the last 168 hours.  Recent Results (from the past 240 hours)  Resp panel by RT-PCR (RSV, Flu A&B, Covid) Anterior Nasal Swab     Status: None   Collection Time: 05/17/24 12:07 PM   Specimen: Anterior Nasal Swab  Result Value Ref Range Status   SARS Coronavirus 2 by RT PCR NEGATIVE NEGATIVE Final    Comment: (NOTE) SARS-CoV-2 target nucleic acids are NOT DETECTED.  The SARS-CoV-2 RNA is generally detectable in upper respiratory specimens during the acute phase of infection.  The lowest concentration of SARS-CoV-2 viral copies this assay can detect is 138 copies/mL. A negative result does not preclude SARS-Cov-2 infection and should not be used as the sole basis for treatment or other patient management decisions. A negative result may occur with  improper specimen collection/handling, submission of specimen other than nasopharyngeal swab, presence of viral mutation(s) within the areas targeted by this assay, and inadequate number of viral copies(<138 copies/mL). A negative result must be combined with clinical observations, patient history, and epidemiological information. The expected result is Negative.  Fact Sheet for Patients:  bloggercourse.com  Fact Sheet for Healthcare Providers:  seriousbroker.it  This test is no t yet approved or cleared by the [place]  FDA and  has been authorized for detection and/or diagnosis of SARS-CoV-2 by FDA under an Emergency Use Authorization (EUA). This EUA will remain  in effect (meaning this test can be used) for the duration of the COVID-19 declaration under Section 564(b)(1) of the Act, 21 U.S.C.section 360bbb-3(b)(1), unless the authorization is terminated  or revoked sooner.       Influenza A by PCR NEGATIVE NEGATIVE Final   Influenza B by PCR NEGATIVE NEGATIVE Final    Comment: (NOTE) The Xpert Xpress SARS-CoV-2/FLU/RSV plus assay is intended as an aid in the diagnosis of influenza from Nasopharyngeal swab specimens and should not be used as a sole basis for treatment. Nasal washings and aspirates are unacceptable for Xpert Xpress SARS-CoV-2/FLU/RSV testing.  Fact Sheet for Patients: bloggercourse.com  Fact Sheet for Healthcare Providers: seriousbroker.it  This test is not yet approved or cleared by the [place]  FDA and has been authorized for detection and/or diagnosis of SARS-CoV-2 by FDA  under an Emergency Use Authorization (EUA). This EUA will remain in effect (meaning this test can be used) for the duration of the COVID-19 declaration under Section 564(b)(1) of the Act, 21 U.S.C. section 360bbb-3(b)(1), unless the authorization is terminated or revoked.     Resp Syncytial Virus by PCR NEGATIVE NEGATIVE Final    Comment: (NOTE) Fact Sheet for Patients: bloggercourse.com  Fact Sheet for Healthcare Providers: seriousbroker.it  This test is not yet approved or cleared by the [place]  FDA and has been authorized for detection and/or diagnosis of SARS-CoV-2 by FDA under an Emergency Use Authorization (EUA). This EUA will remain in effect (meaning this test can be used) for the duration of the COVID-19 declaration under Section 564(b)(1) of the Act, 21 U.S.C. section 360bbb-3(b)(1), unless the authorization is terminated or revoked.  Performed at [facility], [address] [place], [place] [address]          Radiology Studies: ECHOCARDIOGRAM COMPLETE Result Date: 05/18/2024    ECHOCARDIOGRAM REPORT   Patient  Name:   RINDY KOLLMAN Deike Date of Exam: 05/18/2024 Medical Rec #:  [id number]          Height:       60.0 in Accession #:    [id number]         Weight:       125.0 lb Date of Birth:  1941/03/12          BSA:          1.529 m Patient Age:    83 years           BP:           108/65 mmHg Patient Gender: F                  HR:           100 bpm. Exam Location:  Inpatient Procedure: 2D Echo, Cardiac Doppler and Color Doppler (Both Spectral and Color            Flow Doppler were utilized during procedure). Indications:    Atrial Fibrillation I48.91  History:        Patient has prior history of Echocardiogram examinations, most                 recent 03/22/2022. CKD, Arrythmias:Atrial Fibrillation; Risk                 Factors:Dyslipidemia and Hypertension.  Sonographer:    Koleen Popper RDCS  Referring Phys: [id number] KELLY A GRIFFITH  Sonographer Comments: Image acquisition challenging due to patient body habitus. IMPRESSIONS  1. Left ventricular ejection fraction, by estimation, is 45 to 50%. The left ventricle has mildly decreased function. The left ventricle demonstrates global hypokinesis. Left ventricular diastolic parameters are indeterminate.  2. Right ventricular systolic function is mildly reduced. The right ventricular size is mildly enlarged. There is mildly elevated pulmonary artery systolic pressure. The estimated right ventricular systolic pressure is 43.3 mmHg.  3. Left atrial size was mildly dilated.  4. Right atrial size was moderately dilated.  5. The mitral valve is abnormal. Mild to moderate mitral valve regurgitation. No evidence of mitral stenosis.  6. Tricuspid valve regurgitation is mild to moderate.  7. The aortic valve is tricuspid. There is moderate calcification of the aortic valve. Aortic valve regurgitation is not visualized. No aortic stenosis is present.  8. The inferior vena cava is normal in size with <50% respiratory variability, suggesting right atrial pressure of 8 mmHg.  9. The patient was in atrial fibrillation. FINDINGS  Left Ventricle: Left ventricular ejection fraction, by estimation, is 45 to 50%. The left ventricle has mildly decreased function. The left ventricle demonstrates global hypokinesis. The left ventricular internal cavity size was normal in size. There is  no left ventricular hypertrophy. Left ventricular diastolic parameters are indeterminate. Right Ventricle: The right ventricular size is mildly enlarged. No increase in right ventricular wall thickness. Right ventricular systolic function is mildly reduced. There is mildly elevated pulmonary artery systolic pressure. The tricuspid regurgitant  velocity is 2.97 m/s, and with an assumed right atrial pressure of 8 mmHg, the estimated right ventricular systolic pressure is 43.3 mmHg. Left Atrium: Left  atrial size was mildly dilated. Right Atrium: Right atrial size was moderately dilated. Pericardium: There is no evidence of pericardial effusion. Mitral Valve: The mitral valve is abnormal. There is mild calcification of the mitral valve leaflet(s). Mild mitral annular calcification. Mild to moderate mitral valve regurgitation. No evidence of mitral valve stenosis. Tricuspid Valve: The  tricuspid valve is normal in structure. Tricuspid valve regurgitation is mild to moderate. Aortic Valve: The aortic valve is tricuspid. There is moderate calcification of the aortic valve. Aortic valve regurgitation is not visualized. No aortic stenosis is present. Pulmonic Valve: The pulmonic valve was normal in structure. Pulmonic valve regurgitation is not visualized. Aorta: The aortic root is normal in size and structure. Venous: The inferior vena cava is normal in size with less than 50% respiratory variability, suggesting right atrial pressure of 8 mmHg. IAS/Shunts: No atrial level shunt detected by color flow Doppler.  LEFT VENTRICLE PLAX 2D LVIDd:         3.90 cm LVIDs:         2.90 cm LV PW:         0.70 cm LV IVS:        0.80 cm LVOT diam:     1.80 cm LV SV:         39 LV SV Index:   25 LVOT Area:     2.54 cm  LV Volumes (MOD) LV vol d, MOD A2C: 58.9 ml LV vol s, MOD A2C: 26.8 ml LV SV MOD A2C:     32.1 ml RIGHT VENTRICLE          IVC RV Basal diam:  3.60 cm  IVC diam: 2.00 cm TAPSE (M-mode): 1.5 cm LEFT ATRIUM             Index        RIGHT ATRIUM           Index LA diam:        4.00 cm 2.62 cm/m   RA Area:     16.30 cm LA Vol (A2C):   30.1 ml 19.69 ml/m  RA Volume:   41.40 ml  27.08 ml/m LA Vol (A4C):   33.0 ml 21.59 ml/m LA Biplane Vol: 33.5 ml 21.92 ml/m  AORTIC VALVE LVOT Vmax:   79.60 cm/s LVOT Vmean:  52.000 cm/s LVOT VTI:    0.153 m  AORTA Ao Root diam: 2.60 cm Ao Asc diam:  3.20 cm MR Peak grad: 70.8 mmHg   TRICUSPID VALVE MR Mean grad: 46.0 mmHg   TR Peak grad:   35.3 mmHg MR Vmax:      420.75 cm/s TR Mean  grad:   25.0 mmHg MR Vmean:     322.0 cm/s  TR Vmax:        297.00 cm/s                           TR Vmean:       244.0 cm/s                            SHUNTS                           Systemic VTI:  0.15 m                           Systemic Diam: 1.80 cm Dalton McleanMD Electronically signed by Ezra Kanner Signature Date/Time: 05/18/2024/4:15:11 PM    Final    MR BRAIN WO CONTRAST Result Date: 05/17/2024 EXAM: MRI Brain Without Contrast 05/17/2024 06:58:00 PM TECHNIQUE: Multiplanar multisequence MRI of the head/brain was performed without the administration of intravenous contrast. COMPARISON: CT head from earlier today.  CLINICAL HISTORY: Transient ischemic attack (TIA); Dizziness and headache FINDINGS: BRAIN AND VENTRICLES: No acute infarct. No intracranial hemorrhage. No mass. No midline shift. No hydrocephalus. The sella is unremarkable. Normal flow voids. Moderate T2 hyperintensities in the white matter, compatible with small vessel ischemic disease. ORBITS: No acute abnormality. SINUSES AND MASTOIDS: No acute abnormality. BONES AND SOFT TISSUES: Normal marrow signal. No acute soft tissue abnormality. IMPRESSION: 1. No acute intracranial abnormality. 2. Moderate small vessel ischemic disease. Electronically signed by: Gilmore Molt 05/17/2024 10:06 PM EST RP Workstation: [id number]   DG Chest Portable 1 View Result Date: 05/17/2024 EXAM: XR Chest, 1 View(s). CLINICAL HISTORY: afib COMPARISON: 11/30/2022 FINDINGS: LUNGS: The lungs are clear. No consolidation. PLEURAL SPACES: No pleural effusion or pneumothorax. HEART: The heart size is normal. BONES: No acute osseous abnormality. IMPRESSION: 1. No acute cardiopulmonary abnormality. Electronically signed by: Lynwood Seip MD 05/17/2024 02:15 PM EST RP Workstation: [id number]        Scheduled Meds:  apixaban   2.5 mg Oral BID   diltiazem   120 mg Oral Daily   metoprolol  tartrate  25 mg Oral BID   rosuvastatin   20 mg Oral Daily   umeclidinium  bromide  1 puff Inhalation Daily   Continuous Infusions:   LOS: 0 days    Time spent: 50 minutes spent on 05/19/2024 caring for this patient face-to-face including chart review, ordering labs/tests, documenting, discussion with nursing staff, consultants, updating family and interview/physical exam    Camellia PARAS Raihan Kimmel, DO [facility] Available via Epic secure chat 7am-7pm After these hours, please refer to coverage provider listed on [email or web address] 05/19/2024, 1:19 PM

## 2024-05-20 DIAGNOSIS — I4891 Unspecified atrial fibrillation: Secondary | ICD-10-CM | POA: Diagnosis not present

## 2024-05-20 LAB — BASIC METABOLIC PANEL WITH GFR
Anion gap: 8 (ref 5–15)
BUN: 22 mg/dL (ref 8–23)
CO2: 24 mmol/L (ref 22–32)
Calcium: 9.5 mg/dL (ref 8.9–10.3)
Chloride: 103 mmol/L (ref 98–111)
Creatinine, Ser: 1.08 mg/dL — ABNORMAL HIGH (ref 0.44–1.00)
GFR, Estimated: 50 mL/min — ABNORMAL LOW (ref >60)
Glucose, Bld: 90 mg/dL (ref 70–99)
Potassium: 4.4 mmol/L (ref 3.5–5.1)
Sodium: 135 mmol/L (ref 135–145)

## 2024-05-20 LAB — GLUCOSE, CAPILLARY: Glucose-Capillary: 110 mg/dL — ABNORMAL HIGH (ref 70–99)

## 2024-05-20 MED ORDER — METOPROLOL TARTRATE 25 MG PO TABS
25.0000 mg | ORAL_TABLET | Freq: Three times a day (TID) | ORAL | Status: DC
  Administered 2024-05-20 – 2024-05-21 (×3): 25 mg via ORAL
  Filled 2024-05-20 (×3): qty 1

## 2024-05-20 MED ORDER — KATE FARMS STANDARD 1.4 PO LIQD
325.0000 mL | Freq: Two times a day (BID) | ORAL | Status: DC
  Filled 2024-05-20 (×5): qty 325

## 2024-05-20 MED ORDER — SODIUM CHLORIDE 0.9 % IV SOLN: INTRAVENOUS | Status: DC

## 2024-05-20 NOTE — Progress Notes (Signed)
 Patient alert has a 2.85 pause. Np on call made aware.   05/19/24 2353  Assess: MEWS Score  Temp 99.5 F (37.5 C)  BP 112/63  MAP (mmHg) 74  Pulse Rate (!) 48  ECG Heart Rate (!) 114  Resp 16  Level of Consciousness Alert  SpO2 97 %  O2 Device Room Air  Assess: MEWS Score  MEWS Temp 0  MEWS Systolic 0  MEWS Pulse 2  MEWS RR 0  MEWS LOC 0  MEWS Score 2  MEWS Score Color Yellow  Assess: if the MEWS score is Yellow or Red  Were vital signs accurate and taken at a resting state? Yes  Does the patient meet 2 or more of the SIRS criteria? No  MEWS guidelines implemented  Yes, yellow  Treat  MEWS Interventions Considered administering scheduled or prn medications/treatments as ordered  Take Vital Signs  Increase Vital Sign Frequency  Yellow: Q2hr x1, continue Q4hrs until patient remains green for 12hrs  Escalate  MEWS: Escalate Yellow: Discuss with charge nurse and consider notifying provider and/or RRT  Notify: Charge Nurse/RN  Name of Charge Nurse/RN Notified Philippe Doyle PEAK  Provider Notification  Provider Name/Title A. Chavez,NP  Date Provider Notified 05/20/24  Time Provider Notified 0128  Method of Notification Page  Notification Reason Other (Comment) (2.85 SEC PAUSE)  Provider response No new orders  Date of Provider Response 05/20/24  Time of Provider Response 0129  Assess: SIRS CRITERIA  SIRS Temperature  0  SIRS Respirations  0  SIRS Pulse 1  SIRS WBC 0  SIRS Score Sum  1

## 2024-05-20 NOTE — Progress Notes (Signed)
 DAILY PROGRESS NOTE   Patient Name: Ashley Mayer Date of Encounter: 05/20/2024 Cardiologist: Shelda Bruckner, MD  Chief Complaint   Scalp numbness  Patient Profile   Ashley Mayer is a 83 y.o. female with a hx of HTN/?white coat hypertension, HLD, mild to moderate MR, CKD III, HLD, celliac, emphysema/bronchiectasis, and newly recognized Afib who is being seen 05/18/2024 for the evaluation of Afib RVR at the request of Dr. Austria.   Subjective   AFib with RVR at times that persists - plan for TEE/DCCV tomorrow at [facility].  Objective   Vitals:   05/20/24 0214 05/20/24 0537 05/20/24 0744 05/20/24 0940  BP:  102/67  99/66  Pulse:  60  74  Resp: 16 16  16   Temp:  97.6 F (36.4 C)  (!) 97.4 F (36.3 C)  TempSrc:  Oral  Oral  SpO2:  98% 98% 100%  Weight:      Height:        Intake/Output Summary (Last 24 hours) at 05/20/2024 1013 Last data filed at 05/20/2024 0900 Gross per 24 hour  Intake 600 ml  Output --  Net 600 ml   Filed Weights   05/17/24 1901  Weight: 56.7 kg    Physical Exam   General appearance: alert and no distress Lungs: clear to auscultation bilaterally Heart: irregularly irregular rhythm Extremities: extremities normal, atraumatic, no cyanosis or edema Neurologic: Grossly normal  Inpatient Medications    Scheduled Meds:  apixaban   2.5 mg Oral BID   diltiazem   120 mg Oral Daily   metoprolol  tartrate  25 mg Oral BID   rosuvastatin   20 mg Oral Daily   umeclidinium bromide   1 puff Inhalation Daily    Continuous Infusions:   PRN Meds: acetaminophen  **OR** acetaminophen , albuterol , pantoprazole , polyethylene glycol   Labs   Results for orders placed or performed during the hospital encounter of 05/17/24 (from the past 48 hours)  Basic metabolic panel with GFR     Status: Abnormal   Collection Time: 05/19/24  5:42 AM  Result Value Ref Range   Sodium 140 135 - 145 mmol/L   Potassium 3.9 3.5 - 5.1 mmol/L   Chloride 105  98 - 111 mmol/L   CO2 23 22 - 32 mmol/L   Glucose, Bld 91 70 - 99 mg/dL    Comment: Glucose reference range applies only to samples taken after fasting for at least 8 hours.   BUN 14 8 - 23 mg/dL   Creatinine, Ser 8.88 (H) 0.44 - 1.00 mg/dL   Calcium  9.2 8.9 - 10.3 mg/dL   GFR, Estimated 49 (L) >60 mL/min    Comment: (NOTE) Calculated using the CKD-EPI Creatinine Equation (2021)    Anion gap 11 5 - 15    Comment: Performed at [facility], [address] [place], [place] [address]  Magnesium     Status: Abnormal   Collection Time: 05/19/24  5:42 AM  Result Value Ref Range   Magnesium 2.5 (H) 1.7 - 2.4 mg/dL    Comment: Performed at [facility], [address], [place], [place] [address]  Glucose, capillary     Status: Abnormal   Collection Time: 05/20/24  7:58 AM  Result Value Ref Range   Glucose-Capillary 110 (H) 70 - 99 mg/dL    Comment: Glucose reference range applies only to samples taken after fasting for at least 8 hours.    ECG   N/A  Telemetry   Afib with RVR -  Personally Reviewed  Radiology    ECHOCARDIOGRAM COMPLETE Result Date: 05/18/2024    ECHOCARDIOGRAM REPORT   Patient Name:   Ashley Mayer Date of Exam: 05/18/2024 Medical Rec #:  [id number]          Height:       60.0 in Accession #:    [id number]         Weight:       125.0 lb Date of Birth:  May 13, 1941          BSA:          1.529 m Patient Age:    83 years           BP:           108/65 mmHg Patient Gender: F                  HR:           100 bpm. Exam Location:  Inpatient Procedure: 2D Echo, Cardiac Doppler and Color Doppler (Both Spectral and Color            Flow Doppler were utilized during procedure). Indications:    Atrial Fibrillation I48.91  History:        Patient has prior history of Echocardiogram examinations, most                 recent 03/22/2022. CKD, Arrythmias:Atrial Fibrillation; Risk                 Factors:Dyslipidemia and Hypertension.   Sonographer:    Koleen Popper RDCS Referring Phys: [id number] KELLY A GRIFFITH  Sonographer Comments: Image acquisition challenging due to patient body habitus. IMPRESSIONS  1. Left ventricular ejection fraction, by estimation, is 45 to 50%. The left ventricle has mildly decreased function. The left ventricle demonstrates global hypokinesis. Left ventricular diastolic parameters are indeterminate.  2. Right ventricular systolic function is mildly reduced. The right ventricular size is mildly enlarged. There is mildly elevated pulmonary artery systolic pressure. The estimated right ventricular systolic pressure is 43.3 mmHg.  3. Left atrial size was mildly dilated.  4. Right atrial size was moderately dilated.  5. The mitral valve is abnormal. Mild to moderate mitral valve regurgitation. No evidence of mitral stenosis.  6. Tricuspid valve regurgitation is mild to moderate.  7. The aortic valve is tricuspid. There is moderate calcification of the aortic valve. Aortic valve regurgitation is not visualized. No aortic stenosis is present.  8. The inferior vena cava is normal in size with <50% respiratory variability, suggesting right atrial pressure of 8 mmHg.  9. The patient was in atrial fibrillation. FINDINGS  Left Ventricle: Left ventricular ejection fraction, by estimation, is 45 to 50%. The left ventricle has mildly decreased function. The left ventricle demonstrates global hypokinesis. The left ventricular internal cavity size was normal in size. There is  no left ventricular hypertrophy. Left ventricular diastolic parameters are indeterminate. Right Ventricle: The right ventricular size is mildly enlarged. No increase in right ventricular wall thickness. Right ventricular systolic function is mildly reduced. There is mildly elevated pulmonary artery systolic pressure. The tricuspid regurgitant  velocity is 2.97 m/s, and with an assumed right atrial pressure of 8 mmHg, the estimated right ventricular systolic  pressure is 43.3 mmHg. Left Atrium: Left atrial size was mildly dilated. Right Atrium: Right atrial size was moderately dilated. Pericardium: There is no evidence of pericardial effusion. Mitral Valve: The mitral valve is abnormal. There is mild calcification of the mitral valve  leaflet(s). Mild mitral annular calcification. Mild to moderate mitral valve regurgitation. No evidence of mitral valve stenosis. Tricuspid Valve: The tricuspid valve is normal in structure. Tricuspid valve regurgitation is mild to moderate. Aortic Valve: The aortic valve is tricuspid. There is moderate calcification of the aortic valve. Aortic valve regurgitation is not visualized. No aortic stenosis is present. Pulmonic Valve: The pulmonic valve was normal in structure. Pulmonic valve regurgitation is not visualized. Aorta: The aortic root is normal in size and structure. Venous: The inferior vena cava is normal in size with less than 50% respiratory variability, suggesting right atrial pressure of 8 mmHg. IAS/Shunts: No atrial level shunt detected by color flow Doppler.  LEFT VENTRICLE PLAX 2D LVIDd:         3.90 cm LVIDs:         2.90 cm LV PW:         0.70 cm LV IVS:        0.80 cm LVOT diam:     1.80 cm LV SV:         39 LV SV Index:   25 LVOT Area:     2.54 cm  LV Volumes (MOD) LV vol d, MOD A2C: 58.9 ml LV vol s, MOD A2C: 26.8 ml LV SV MOD A2C:     32.1 ml RIGHT VENTRICLE          IVC RV Basal diam:  3.60 cm  IVC diam: 2.00 cm TAPSE (M-mode): 1.5 cm LEFT ATRIUM             Index        RIGHT ATRIUM           Index LA diam:        4.00 cm 2.62 cm/m   RA Area:     16.30 cm LA Vol (A2C):   30.1 ml 19.69 ml/m  RA Volume:   41.40 ml  27.08 ml/m LA Vol (A4C):   33.0 ml 21.59 ml/m LA Biplane Vol: 33.5 ml 21.92 ml/m  AORTIC VALVE LVOT Vmax:   79.60 cm/s LVOT Vmean:  52.000 cm/s LVOT VTI:    0.153 m  AORTA Ao Root diam: 2.60 cm Ao Asc diam:  3.20 cm MR Peak grad: 70.8 mmHg   TRICUSPID VALVE MR Mean grad: 46.0 mmHg   TR Peak grad:    35.3 mmHg MR Vmax:      420.75 cm/s TR Mean grad:   25.0 mmHg MR Vmean:     322.0 cm/s  TR Vmax:        297.00 cm/s                           TR Vmean:       244.0 cm/s                            SHUNTS                           Systemic VTI:  0.15 m                           Systemic Diam: 1.80 cm Dalton McleanMD Electronically signed by Ezra Kanner Signature Date/Time: 05/18/2024/4:15:11 PM    Final     Cardiac Studies   See echo above  Assessment   Principal Problem:  Atrial fibrillation with RVR (HCC) Active Problems:   Mixed hyperlipidemia   Essential hypertension   Dizziness   Pulmonary emphysema (HCC)   Plan   AFib with elevated rates at times - will further increase metoprolol  today. Plan for TEE/DCCV tomorrow at [facility]. She is agreeable.  Informed Consent   Shared Decision Making/Informed Consent   The risks [stroke, cardiac arrhythmias rarely resulting in the need for a temporary or permanent pacemaker, skin irritation or burns, esophageal damage, perforation (1:10,000 risk), bleeding, pharyngeal hematoma as well as other potential complications associated with conscious sedation including aspiration, arrhythmia, respiratory failure and death], benefits (treatment guidance, restoration of normal sinus rhythm, diagnostic support) and alternatives of a transesophageal echocardiogram guided cardioversion were discussed in detail with Ms. Vanostrand and she is willing to proceed.     Time Spent Directly with Patient:  I have spent a total of 25 minutes with the patient reviewing hospital notes, telemetry, EKGs, labs and examining the patient as well as establishing an assessment and plan that was discussed personally with the patient.  > 50% of time was spent in direct patient care.  Length of Stay:  LOS: 0 days   Vinie KYM Maxcy, MD, [facility], FNLA, FACP  [place]  [facility]  Medical Director of the Advanced Lipid Disorders &  Cardiovascular Risk Reduction  Clinic Diplomate of the American Board of Clinical Lipidology Attending Cardiologist  Direct Dial: [phone]  Fax: [phone]  Website:  [email or web address] Vinie BROCKS Rhylan Gross 05/20/2024, 10:13 AM

## 2024-05-20 NOTE — Plan of Care (Signed)
  Problem: Education: Goal: Knowledge of General Education information will improve Description: Including pain rating scale, medication(s)/side effects and non-pharmacologic comfort measures Outcome: Completed/Met   Problem: Activity: Goal: Risk for activity intolerance will decrease Outcome: Completed/Met

## 2024-05-20 NOTE — Progress Notes (Signed)
 PROGRESS NOTE    Ashley Mayer  FMW:[id number] DOB: 24-Dec-1940 DOA: 05/17/2024 PCP: Rolinda Millman, MD   Brief Narrative: 83 year old with past medical history significant for paroxysmal A-fib, recently diagnosed and started on metoprolol , Eliquis  by cardiology, hypertension, hyperlipidemia, peripheral vascular disease, emphysema, bronchiectasis presented to the ED 12/11 for evaluation of dizziness, shortness of breath and headache.  Symptoms started at 9 AM the day of admission which feels similar to previous vertigo spell.  Reports symptoms are getting more frequent.  Of note patient during her pulmonary rehab on 12/9 was found to be in A-fib with RVR heart rate 130s at 148.  She was asymptomatic at that time.  She was seen by cardiology clinic the day prior to admission and she was started on Lopressor .  In the ED heart rate was 112, troponin 16, COVID RSV PCR negative.  UDS negative.  CT head without contrast no acute intracranial abnormality.  MRI without contrast showed no acute intra cranial abnormality, EKG A-fib with RVR.  She was started on Cardizem  drip.  Cardiology consulted.  Admitted for further evaluation of A-fib RVR   Assessment & Plan:   Principal Problem:   Atrial fibrillation with RVR (HCC) Active Problems:   Dizziness   Essential hypertension   Mixed hyperlipidemia   Pulmonary emphysema (HCC)  1-Paroxysmal A-fib with RVR Hypertension Patient presented with dizziness, shortness of breath and headache.  Recently diagnosed with A-fib.  Recently started on Eliquis . On admission she was found to be in A-fib RVR heart rate 130s 140s.  Started on Cardizem  drip. Transthoracic echo ejection fraction 45 to 50%, global hypokinesis. Cardiology consulted. Transition to oral Cardizem . Continue metoprolol  Continue Eliquis  Plan for TEE cardioversion on Monday  Emphysema/bronchiectasis As needed albuterol  Continue Incruse Ellipta   CKD stage  IIIa Stable.  Hyperlipidemia Continue Crestor   Peripheral vascular disease Recent ABI abnormal continue follow-up as an outpatient  Celiac disease and lactose intolerance: Nutritionist consulted.  She also had questions about sedatives that will be used for procedure tomorrow.  Per cardiology patient will need to discuss further question with anesthesiologist  Estimated body mass index is 24.41 kg/m as calculated from the following:   Height as of this encounter: 5' (1.524 m).   Weight as of this encounter: 56.7 kg.   DVT prophylaxis: Eliquis  Code Status: Full code Family Communication: Son who was at bedside Disposition Plan:  Status is: Observation The patient remains OBS appropriate and will d/c before 2 midnights.    Consultants:  Cardiology  Procedures:  Echo  Antimicrobials:    Subjective: She is doing okay denies worsening dizziness or shortness of breath.  She is awaiting procedure for tomorrow.  She has a history of celiac disease and need to follow gluten-free diet.  She is also lactose intolerant.  No significant oral intake in the hospital due to limited menu.  Family to bring food.   Objective: Vitals:   05/20/24 0211 05/20/24 0214 05/20/24 0537 05/20/24 0744  BP: 99/64  102/67   Pulse: 63  60   Resp: 16 16 16    Temp: 97.6 F (36.4 C)  97.6 F (36.4 C)   TempSrc: Oral  Oral   SpO2: 97%  98% 98%  Weight:      Height:        Intake/Output Summary (Last 24 hours) at 05/20/2024 0815 Last data filed at 05/19/2024 2205 Gross per 24 hour  Intake 600 ml  Output --  Net 600 ml   [employer]  05/17/24 1901  Weight: 56.7 kg    Examination:  General exam: Appears calm and comfortable  Respiratory system: Clear to auscultation. Respiratory effort normal. Cardiovascular system: S1 & S2 heard, RRR. No JVD, murmurs, rubs, gallops or clicks. No pedal edema. Gastrointestinal system: Abdomen is nondistended, soft and nontender. No organomegaly or  masses felt. Normal bowel sounds heard. Central nervous system: Alert and oriented. No focal neurological deficits. Extremities: Symmetric 5 x 5 power.   Data Reviewed: I have personally reviewed following labs and imaging studies  CBC: Recent Labs  Lab 05/17/24 1136  WBC 7.6  NEUTROABS 5.8  HGB 11.7*  HCT 35.3*  MCV 98.1  PLT 302   Basic Metabolic Panel: Recent Labs  Lab 05/17/24 1136 05/17/24 1925 05/18/24 0501 05/19/24 0542  NA 134*  --  134* 140  K 5.0  --  3.9 3.9  CL 97*  --  101 105  CO2 24  --  22 23  GLUCOSE 110*  --  94 91  BUN 18  --  16 14  CREATININE 1.26*  --  1.10* 1.11*  CALCIUM  10.2  --  9.0 9.2  MG  --  2.1  --  2.5*   GFR: Estimated Creatinine Clearance: 29.8 mL/min (A) (by C-G formula based on SCr of 1.11 mg/dL (H)). Liver Function Tests: Recent Labs  Lab 05/17/24 1136  AST 33  ALT 27  ALKPHOS 70  BILITOT 0.6  PROT 7.3  ALBUMIN 4.5   No results for input(s): LIPASE, AMYLASE in the last 168 hours. No results for input(s): AMMONIA in the last 168 hours. Coagulation Profile: Recent Labs  Lab 05/17/24 1136  INR 1.2   Cardiac Enzymes: No results for input(s): CKTOTAL, CKMB, CKMBINDEX, TROPONINI in the last 168 hours. BNP (last 3 results) No results for input(s): PROBNP in the last 8760 hours. HbA1C: No results for input(s): HGBA1C in the last 72 hours. CBG: Recent Labs  Lab 05/17/24 1127 05/20/24 0758  GLUCAP 95 110*   Lipid Profile: No results for input(s): CHOL, HDL, LDLCALC, TRIG, CHOLHDL, LDLDIRECT in the last 72 hours. Thyroid  Function Tests: Recent Labs    05/18/24 0501  TSH 4.440   Anemia Panel: No results for input(s): VITAMINB12, FOLATE, FERRITIN, TIBC, IRON, RETICCTPCT in the last 72 hours. Sepsis Labs: No results for input(s): PROCALCITON, LATICACIDVEN in the last 168 hours.  Recent Results (from the past 240 hours)  Resp panel by RT-PCR (RSV, Flu A&B, Covid)  Anterior Nasal Swab     Status: None   Collection Time: 05/17/24 12:07 PM   Specimen: Anterior Nasal Swab  Result Value Ref Range Status   SARS Coronavirus 2 by RT PCR NEGATIVE NEGATIVE Final    Comment: (NOTE) SARS-CoV-2 target nucleic acids are NOT DETECTED.  The SARS-CoV-2 RNA is generally detectable in upper respiratory specimens during the acute phase of infection. The lowest concentration of SARS-CoV-2 viral copies this assay can detect is 138 copies/mL. A negative result does not preclude SARS-Cov-2 infection and should not be used as the sole basis for treatment or other patient management decisions. A negative result may occur with  improper specimen collection/handling, submission of specimen other than nasopharyngeal swab, presence of viral mutation(s) within the areas targeted by this assay, and inadequate number of viral copies(<138 copies/mL). A negative result must be combined with clinical observations, patient history, and epidemiological information. The expected result is Negative.  Fact Sheet for Patients:  bloggercourse.com  Fact Sheet for Healthcare Providers:  seriousbroker.it  This test is no t yet approved or cleared by the [place]  FDA and  has been authorized for detection and/or diagnosis of SARS-CoV-2 by FDA under an Emergency Use Authorization (EUA). This EUA will remain  in effect (meaning this test can be used) for the duration of the COVID-19 declaration under Section 564(b)(1) of the Act, 21 U.S.C.section 360bbb-3(b)(1), unless the authorization is terminated  or revoked sooner.       Influenza A by PCR NEGATIVE NEGATIVE Final   Influenza B by PCR NEGATIVE NEGATIVE Final    Comment: (NOTE) The Xpert Xpress SARS-CoV-2/FLU/RSV plus assay is intended as an aid in the diagnosis of influenza from Nasopharyngeal swab specimens and should not be used as a sole basis for treatment. Nasal washings  and aspirates are unacceptable for Xpert Xpress SARS-CoV-2/FLU/RSV testing.  Fact Sheet for Patients: bloggercourse.com  Fact Sheet for Healthcare Providers: seriousbroker.it  This test is not yet approved or cleared by the [place]  FDA and has been authorized for detection and/or diagnosis of SARS-CoV-2 by FDA under an Emergency Use Authorization (EUA). This EUA will remain in effect (meaning this test can be used) for the duration of the COVID-19 declaration under Section 564(b)(1) of the Act, 21 U.S.C. section 360bbb-3(b)(1), unless the authorization is terminated or revoked.     Resp Syncytial Virus by PCR NEGATIVE NEGATIVE Final    Comment: (NOTE) Fact Sheet for Patients: bloggercourse.com  Fact Sheet for Healthcare Providers: seriousbroker.it  This test is not yet approved or cleared by the [place]  FDA and has been authorized for detection and/or diagnosis of SARS-CoV-2 by FDA under an Emergency Use Authorization (EUA). This EUA will remain in effect (meaning this test can be used) for the duration of the COVID-19 declaration under Section 564(b)(1) of the Act, 21 U.S.C. section 360bbb-3(b)(1), unless the authorization is terminated or revoked.  Performed at [facility], [address] [place], [place] [address]          Radiology Studies: ECHOCARDIOGRAM COMPLETE Result Date: 05/18/2024    ECHOCARDIOGRAM REPORT   Patient Name:   Ashley Mayer Date of Exam: 05/18/2024 Medical Rec #:  [id number]          Height:       60.0 in Accession #:    [id number]         Weight:       125.0 lb Date of Birth:  1941-05-02          BSA:          1.529 m Patient Age:    83 years           BP:           108/65 mmHg Patient Gender: F                  HR:           100 bpm. Exam Location:  Inpatient Procedure: 2D Echo, Cardiac Doppler and Color  Doppler (Both Spectral and Color            Flow Doppler were utilized during procedure). Indications:    Atrial Fibrillation I48.91  History:        Patient has prior history of Echocardiogram examinations, most                 recent 03/22/2022. CKD, Arrythmias:Atrial Fibrillation; Risk                 Factors:Dyslipidemia and Hypertension.  Sonographer:    Koleen Popper RDCS Referring Phys: [id number] KELLY A GRIFFITH  Sonographer Comments: Image acquisition challenging due to patient body habitus. IMPRESSIONS  1. Left ventricular ejection fraction, by estimation, is 45 to 50%. The left ventricle has mildly decreased function. The left ventricle demonstrates global hypokinesis. Left ventricular diastolic parameters are indeterminate.  2. Right ventricular systolic function is mildly reduced. The right ventricular size is mildly enlarged. There is mildly elevated pulmonary artery systolic pressure. The estimated right ventricular systolic pressure is 43.3 mmHg.  3. Left atrial size was mildly dilated.  4. Right atrial size was moderately dilated.  5. The mitral valve is abnormal. Mild to moderate mitral valve regurgitation. No evidence of mitral stenosis.  6. Tricuspid valve regurgitation is mild to moderate.  7. The aortic valve is tricuspid. There is moderate calcification of the aortic valve. Aortic valve regurgitation is not visualized. No aortic stenosis is present.  8. The inferior vena cava is normal in size with <50% respiratory variability, suggesting right atrial pressure of 8 mmHg.  9. The patient was in atrial fibrillation. FINDINGS  Left Ventricle: Left ventricular ejection fraction, by estimation, is 45 to 50%. The left ventricle has mildly decreased function. The left ventricle demonstrates global hypokinesis. The left ventricular internal cavity size was normal in size. There is  no left ventricular hypertrophy. Left ventricular diastolic parameters are indeterminate. Right Ventricle: The right  ventricular size is mildly enlarged. No increase in right ventricular wall thickness. Right ventricular systolic function is mildly reduced. There is mildly elevated pulmonary artery systolic pressure. The tricuspid regurgitant  velocity is 2.97 m/s, and with an assumed right atrial pressure of 8 mmHg, the estimated right ventricular systolic pressure is 43.3 mmHg. Left Atrium: Left atrial size was mildly dilated. Right Atrium: Right atrial size was moderately dilated. Pericardium: There is no evidence of pericardial effusion. Mitral Valve: The mitral valve is abnormal. There is mild calcification of the mitral valve leaflet(s). Mild mitral annular calcification. Mild to moderate mitral valve regurgitation. No evidence of mitral valve stenosis. Tricuspid Valve: The tricuspid valve is normal in structure. Tricuspid valve regurgitation is mild to moderate. Aortic Valve: The aortic valve is tricuspid. There is moderate calcification of the aortic valve. Aortic valve regurgitation is not visualized. No aortic stenosis is present. Pulmonic Valve: The pulmonic valve was normal in structure. Pulmonic valve regurgitation is not visualized. Aorta: The aortic root is normal in size and structure. Venous: The inferior vena cava is normal in size with less than 50% respiratory variability, suggesting right atrial pressure of 8 mmHg. IAS/Shunts: No atrial level shunt detected by color flow Doppler.  LEFT VENTRICLE PLAX 2D LVIDd:         3.90 cm LVIDs:         2.90 cm LV PW:         0.70 cm LV IVS:        0.80 cm LVOT diam:     1.80 cm LV SV:         39 LV SV Index:   25 LVOT Area:     2.54 cm  LV Volumes (MOD) LV vol d, MOD A2C: 58.9 ml LV vol s, MOD A2C: 26.8 ml LV SV MOD A2C:     32.1 ml RIGHT VENTRICLE          IVC RV Basal diam:  3.60 cm  IVC diam: 2.00 cm TAPSE (M-mode): 1.5 cm LEFT ATRIUM  Index        RIGHT ATRIUM           Index LA diam:        4.00 cm 2.62 cm/m   RA Area:     16.30 cm LA Vol (A2C):   30.1  ml 19.69 ml/m  RA Volume:   41.40 ml  27.08 ml/m LA Vol (A4C):   33.0 ml 21.59 ml/m LA Biplane Vol: 33.5 ml 21.92 ml/m  AORTIC VALVE LVOT Vmax:   79.60 cm/s LVOT Vmean:  52.000 cm/s LVOT VTI:    0.153 m  AORTA Ao Root diam: 2.60 cm Ao Asc diam:  3.20 cm MR Peak grad: 70.8 mmHg   TRICUSPID VALVE MR Mean grad: 46.0 mmHg   TR Peak grad:   35.3 mmHg MR Vmax:      420.75 cm/s TR Mean grad:   25.0 mmHg MR Vmean:     322.0 cm/s  TR Vmax:        297.00 cm/s                           TR Vmean:       244.0 cm/s                            SHUNTS                           Systemic VTI:  0.15 m                           Systemic Diam: 1.80 cm Dalton McleanMD Electronically signed by Ezra Kanner Signature Date/Time: 05/18/2024/4:15:11 PM    Final         Scheduled Meds:  apixaban   2.5 mg Oral BID   diltiazem   120 mg Oral Daily   metoprolol  tartrate  25 mg Oral BID   rosuvastatin   20 mg Oral Daily   umeclidinium bromide   1 puff Inhalation Daily   Continuous Infusions:   LOS: 0 days    Time spent: 35 minutes    Hawraa Stambaugh A Teale Goodgame, MD [facility]   If 7PM-7AM, please contact night-coverage [email or web address]  05/20/2024, 8:15 AM

## 2024-05-20 NOTE — H&P (View-Only) (Signed)
 DAILY PROGRESS NOTE   Patient Name: Ashley Mayer Date of Encounter: 05/20/2024 Cardiologist: Shelda Bruckner, MD  Chief Complaint   Scalp numbness  Patient Profile   Ashley Mayer is a 83 y.o. female with a hx of HTN/?white coat hypertension, HLD, mild to moderate MR, CKD III, HLD, celliac, emphysema/bronchiectasis, and newly recognized Afib who is being seen 05/18/2024 for the evaluation of Afib RVR at the request of Dr. Austria.   Subjective   AFib with RVR at times that persists - plan for TEE/DCCV tomorrow at [facility].  Objective   Vitals:   05/20/24 0214 05/20/24 0537 05/20/24 0744 05/20/24 0940  BP:  102/67  99/66  Pulse:  60  74  Resp: 16 16  16   Temp:  97.6 F (36.4 C)  (!) 97.4 F (36.3 C)  TempSrc:  Oral  Oral  SpO2:  98% 98% 100%  Weight:      Height:        Intake/Output Summary (Last 24 hours) at 05/20/2024 1013 Last data filed at 05/20/2024 0900 Gross per 24 hour  Intake 600 ml  Output --  Net 600 ml   Filed Weights   05/17/24 1901  Weight: 56.7 kg    Physical Exam   General appearance: alert and no distress Lungs: clear to auscultation bilaterally Heart: irregularly irregular rhythm Extremities: extremities normal, atraumatic, no cyanosis or edema Neurologic: Grossly normal  Inpatient Medications    Scheduled Meds:  apixaban   2.5 mg Oral BID   diltiazem   120 mg Oral Daily   metoprolol  tartrate  25 mg Oral BID   rosuvastatin   20 mg Oral Daily   umeclidinium bromide   1 puff Inhalation Daily    Continuous Infusions:   PRN Meds: acetaminophen  **OR** acetaminophen , albuterol , pantoprazole , polyethylene glycol   Labs   Results for orders placed or performed during the hospital encounter of 05/17/24 (from the past 48 hours)  Basic metabolic panel with GFR     Status: Abnormal   Collection Time: 05/19/24  5:42 AM  Result Value Ref Range   Sodium 140 135 - 145 mmol/L   Potassium 3.9 3.5 - 5.1 mmol/L   Chloride 105  98 - 111 mmol/L   CO2 23 22 - 32 mmol/L   Glucose, Bld 91 70 - 99 mg/dL    Comment: Glucose reference range applies only to samples taken after fasting for at least 8 hours.   BUN 14 8 - 23 mg/dL   Creatinine, Ser 8.88 (H) 0.44 - 1.00 mg/dL   Calcium  9.2 8.9 - 10.3 mg/dL   GFR, Estimated 49 (L) >60 mL/min    Comment: (NOTE) Calculated using the CKD-EPI Creatinine Equation (2021)    Anion gap 11 5 - 15    Comment: Performed at [facility], [address] [place], [place] [address]  Magnesium     Status: Abnormal   Collection Time: 05/19/24  5:42 AM  Result Value Ref Range   Magnesium 2.5 (H) 1.7 - 2.4 mg/dL    Comment: Performed at [facility], [address], [place], [place] [address]  Glucose, capillary     Status: Abnormal   Collection Time: 05/20/24  7:58 AM  Result Value Ref Range   Glucose-Capillary 110 (H) 70 - 99 mg/dL    Comment: Glucose reference range applies only to samples taken after fasting for at least 8 hours.    ECG   N/A  Telemetry   Afib with RVR -  Personally Reviewed  Radiology    ECHOCARDIOGRAM COMPLETE Result Date: 05/18/2024    ECHOCARDIOGRAM REPORT   Patient Name:   Ashley Mayer Date of Exam: 05/18/2024 Medical Rec #:  [id number]          Height:       60.0 in Accession #:    [id number]         Weight:       125.0 lb Date of Birth:  May 13, 1941          BSA:          1.529 m Patient Age:    83 years           BP:           108/65 mmHg Patient Gender: F                  HR:           100 bpm. Exam Location:  Inpatient Procedure: 2D Echo, Cardiac Doppler and Color Doppler (Both Spectral and Color            Flow Doppler were utilized during procedure). Indications:    Atrial Fibrillation I48.91  History:        Patient has prior history of Echocardiogram examinations, most                 recent 03/22/2022. CKD, Arrythmias:Atrial Fibrillation; Risk                 Factors:Dyslipidemia and Hypertension.   Sonographer:    Koleen Popper RDCS Referring Phys: [id number] KELLY A GRIFFITH  Sonographer Comments: Image acquisition challenging due to patient body habitus. IMPRESSIONS  1. Left ventricular ejection fraction, by estimation, is 45 to 50%. The left ventricle has mildly decreased function. The left ventricle demonstrates global hypokinesis. Left ventricular diastolic parameters are indeterminate.  2. Right ventricular systolic function is mildly reduced. The right ventricular size is mildly enlarged. There is mildly elevated pulmonary artery systolic pressure. The estimated right ventricular systolic pressure is 43.3 mmHg.  3. Left atrial size was mildly dilated.  4. Right atrial size was moderately dilated.  5. The mitral valve is abnormal. Mild to moderate mitral valve regurgitation. No evidence of mitral stenosis.  6. Tricuspid valve regurgitation is mild to moderate.  7. The aortic valve is tricuspid. There is moderate calcification of the aortic valve. Aortic valve regurgitation is not visualized. No aortic stenosis is present.  8. The inferior vena cava is normal in size with <50% respiratory variability, suggesting right atrial pressure of 8 mmHg.  9. The patient was in atrial fibrillation. FINDINGS  Left Ventricle: Left ventricular ejection fraction, by estimation, is 45 to 50%. The left ventricle has mildly decreased function. The left ventricle demonstrates global hypokinesis. The left ventricular internal cavity size was normal in size. There is  no left ventricular hypertrophy. Left ventricular diastolic parameters are indeterminate. Right Ventricle: The right ventricular size is mildly enlarged. No increase in right ventricular wall thickness. Right ventricular systolic function is mildly reduced. There is mildly elevated pulmonary artery systolic pressure. The tricuspid regurgitant  velocity is 2.97 m/s, and with an assumed right atrial pressure of 8 mmHg, the estimated right ventricular systolic  pressure is 43.3 mmHg. Left Atrium: Left atrial size was mildly dilated. Right Atrium: Right atrial size was moderately dilated. Pericardium: There is no evidence of pericardial effusion. Mitral Valve: The mitral valve is abnormal. There is mild calcification of the mitral valve  leaflet(s). Mild mitral annular calcification. Mild to moderate mitral valve regurgitation. No evidence of mitral valve stenosis. Tricuspid Valve: The tricuspid valve is normal in structure. Tricuspid valve regurgitation is mild to moderate. Aortic Valve: The aortic valve is tricuspid. There is moderate calcification of the aortic valve. Aortic valve regurgitation is not visualized. No aortic stenosis is present. Pulmonic Valve: The pulmonic valve was normal in structure. Pulmonic valve regurgitation is not visualized. Aorta: The aortic root is normal in size and structure. Venous: The inferior vena cava is normal in size with less than 50% respiratory variability, suggesting right atrial pressure of 8 mmHg. IAS/Shunts: No atrial level shunt detected by color flow Doppler.  LEFT VENTRICLE PLAX 2D LVIDd:         3.90 cm LVIDs:         2.90 cm LV PW:         0.70 cm LV IVS:        0.80 cm LVOT diam:     1.80 cm LV SV:         39 LV SV Index:   25 LVOT Area:     2.54 cm  LV Volumes (MOD) LV vol d, MOD A2C: 58.9 ml LV vol s, MOD A2C: 26.8 ml LV SV MOD A2C:     32.1 ml RIGHT VENTRICLE          IVC RV Basal diam:  3.60 cm  IVC diam: 2.00 cm TAPSE (M-mode): 1.5 cm LEFT ATRIUM             Index        RIGHT ATRIUM           Index LA diam:        4.00 cm 2.62 cm/m   RA Area:     16.30 cm LA Vol (A2C):   30.1 ml 19.69 ml/m  RA Volume:   41.40 ml  27.08 ml/m LA Vol (A4C):   33.0 ml 21.59 ml/m LA Biplane Vol: 33.5 ml 21.92 ml/m  AORTIC VALVE LVOT Vmax:   79.60 cm/s LVOT Vmean:  52.000 cm/s LVOT VTI:    0.153 m  AORTA Ao Root diam: 2.60 cm Ao Asc diam:  3.20 cm MR Peak grad: 70.8 mmHg   TRICUSPID VALVE MR Mean grad: 46.0 mmHg   TR Peak grad:    35.3 mmHg MR Vmax:      420.75 cm/s TR Mean grad:   25.0 mmHg MR Vmean:     322.0 cm/s  TR Vmax:        297.00 cm/s                           TR Vmean:       244.0 cm/s                            SHUNTS                           Systemic VTI:  0.15 m                           Systemic Diam: 1.80 cm Dalton McleanMD Electronically signed by Ezra Kanner Signature Date/Time: 05/18/2024/4:15:11 PM    Final     Cardiac Studies   See echo above  Assessment   Principal Problem:  Atrial fibrillation with RVR (HCC) Active Problems:   Mixed hyperlipidemia   Essential hypertension   Dizziness   Pulmonary emphysema (HCC)   Plan   AFib with elevated rates at times - will further increase metoprolol  today. Plan for TEE/DCCV tomorrow at [facility]. She is agreeable.  Informed Consent   Shared Decision Making/Informed Consent   The risks [stroke, cardiac arrhythmias rarely resulting in the need for a temporary or permanent pacemaker, skin irritation or burns, esophageal damage, perforation (1:10,000 risk), bleeding, pharyngeal hematoma as well as other potential complications associated with conscious sedation including aspiration, arrhythmia, respiratory failure and death], benefits (treatment guidance, restoration of normal sinus rhythm, diagnostic support) and alternatives of a transesophageal echocardiogram guided cardioversion were discussed in detail with Ms. Vanostrand and she is willing to proceed.     Time Spent Directly with Patient:  I have spent a total of 25 minutes with the patient reviewing hospital notes, telemetry, EKGs, labs and examining the patient as well as establishing an assessment and plan that was discussed personally with the patient.  > 50% of time was spent in direct patient care.  Length of Stay:  LOS: 0 days   Vinie KYM Maxcy, MD, [facility], FNLA, FACP  [place]  [facility]  Medical Director of the Advanced Lipid Disorders &  Cardiovascular Risk Reduction  Clinic Diplomate of the American Board of Clinical Lipidology Attending Cardiologist  Direct Dial: [phone]  Fax: [phone]  Website:  [email or web address] Vinie BROCKS Rhylan Gross 05/20/2024, 10:13 AM

## 2024-05-21 ENCOUNTER — Observation Stay (HOSPITAL_COMMUNITY)

## 2024-05-21 ENCOUNTER — Observation Stay (HOSPITAL_COMMUNITY): Admitting: Anesthesiology

## 2024-05-21 ENCOUNTER — Encounter (HOSPITAL_COMMUNITY): Admission: EM | Disposition: A | Payer: Self-pay | Source: Home / Self Care | Attending: Emergency Medicine

## 2024-05-21 DIAGNOSIS — I340 Nonrheumatic mitral (valve) insufficiency: Secondary | ICD-10-CM

## 2024-05-21 DIAGNOSIS — I129 Hypertensive chronic kidney disease with stage 1 through stage 4 chronic kidney disease, or unspecified chronic kidney disease: Secondary | ICD-10-CM | POA: Diagnosis not present

## 2024-05-21 DIAGNOSIS — N183 Chronic kidney disease, stage 3 unspecified: Secondary | ICD-10-CM | POA: Diagnosis not present

## 2024-05-21 DIAGNOSIS — I361 Nonrheumatic tricuspid (valve) insufficiency: Secondary | ICD-10-CM

## 2024-05-21 DIAGNOSIS — I130 Hypertensive heart and chronic kidney disease with heart failure and stage 1 through stage 4 chronic kidney disease, or unspecified chronic kidney disease: Secondary | ICD-10-CM | POA: Diagnosis not present

## 2024-05-21 DIAGNOSIS — I10 Essential (primary) hypertension: Secondary | ICD-10-CM | POA: Diagnosis not present

## 2024-05-21 DIAGNOSIS — N1831 Chronic kidney disease, stage 3a: Secondary | ICD-10-CM | POA: Diagnosis not present

## 2024-05-21 DIAGNOSIS — I4891 Unspecified atrial fibrillation: Secondary | ICD-10-CM | POA: Diagnosis not present

## 2024-05-21 DIAGNOSIS — J479 Bronchiectasis, uncomplicated: Secondary | ICD-10-CM | POA: Diagnosis not present

## 2024-05-21 DIAGNOSIS — E739 Lactose intolerance, unspecified: Secondary | ICD-10-CM | POA: Diagnosis not present

## 2024-05-21 DIAGNOSIS — Z7901 Long term (current) use of anticoagulants: Secondary | ICD-10-CM | POA: Diagnosis not present

## 2024-05-21 DIAGNOSIS — I480 Paroxysmal atrial fibrillation: Secondary | ICD-10-CM | POA: Diagnosis not present

## 2024-05-21 DIAGNOSIS — Z79899 Other long term (current) drug therapy: Secondary | ICD-10-CM | POA: Diagnosis not present

## 2024-05-21 DIAGNOSIS — J449 Chronic obstructive pulmonary disease, unspecified: Secondary | ICD-10-CM | POA: Diagnosis not present

## 2024-05-21 DIAGNOSIS — I739 Peripheral vascular disease, unspecified: Secondary | ICD-10-CM | POA: Diagnosis not present

## 2024-05-21 DIAGNOSIS — E785 Hyperlipidemia, unspecified: Secondary | ICD-10-CM | POA: Diagnosis not present

## 2024-05-21 DIAGNOSIS — F4321 Adjustment disorder with depressed mood: Secondary | ICD-10-CM | POA: Diagnosis not present

## 2024-05-21 HISTORY — PX: TRANSESOPHAGEAL ECHOCARDIOGRAM (CATH LAB): EP1270

## 2024-05-21 HISTORY — PX: CARDIOVERSION: EP1203

## 2024-05-21 LAB — BASIC METABOLIC PANEL WITH GFR
Anion gap: 9 (ref 5–15)
BUN: 22 mg/dL (ref 8–23)
CO2: 23 mmol/L (ref 22–32)
Calcium: 9.1 mg/dL (ref 8.9–10.3)
Chloride: 104 mmol/L (ref 98–111)
Creatinine, Ser: 1.11 mg/dL — ABNORMAL HIGH (ref 0.44–1.00)
GFR, Estimated: 49 mL/min — ABNORMAL LOW (ref >60)
Glucose, Bld: 101 mg/dL — ABNORMAL HIGH (ref 70–99)
Potassium: 4 mmol/L (ref 3.5–5.1)
Sodium: 136 mmol/L (ref 135–145)

## 2024-05-21 LAB — CBC
HCT: 33.5 % — ABNORMAL LOW (ref 36.0–46.0)
Hemoglobin: 10.9 g/dL — ABNORMAL LOW (ref 12.0–15.0)
MCH: 32.9 pg (ref 26.0–34.0)
MCHC: 32.5 g/dL (ref 30.0–36.0)
MCV: 101.2 fL — ABNORMAL HIGH (ref 80.0–100.0)
Platelets: 250 K/uL (ref 150–400)
RBC: 3.31 MIL/uL — ABNORMAL LOW (ref 3.87–5.11)
RDW: 13.2 % (ref 11.5–15.5)
WBC: 7 K/uL (ref 4.0–10.5)
nRBC: 0 % (ref 0.0–0.2)

## 2024-05-21 LAB — ECHO TEE

## 2024-05-21 SURGERY — TRANSESOPHAGEAL ECHOCARDIOGRAM (TEE) (CATHLAB)
Anesthesia: Monitor Anesthesia Care

## 2024-05-21 MED ORDER — PROPOFOL 10 MG/ML IV BOLUS
INTRAVENOUS | Status: DC | PRN
  Administered 2024-05-21: 13:00:00 30 mg via INTRAVENOUS

## 2024-05-21 MED ORDER — PROPOFOL 500 MG/50ML IV EMUL
INTRAVENOUS | Status: DC | PRN
  Administered 2024-05-21: 13:00:00 150 ug/kg/min via INTRAVENOUS

## 2024-05-21 MED ORDER — SODIUM CHLORIDE 0.9 % IV SOLN: INTRAVENOUS | Status: DC | PRN

## 2024-05-21 MED ORDER — EPHEDRINE SULFATE (PRESSORS) 25 MG/5ML IV SOSY
PREFILLED_SYRINGE | INTRAVENOUS | Status: DC | PRN
  Administered 2024-05-21: 13:00:00 10 mg via INTRAVENOUS

## 2024-05-21 MED ORDER — HYDROXYZINE HCL 10 MG PO TABS
5.0000 mg | ORAL_TABLET | Freq: Once | ORAL | Status: AC | PRN
  Administered 2024-05-21: 05:00:00 5 mg via ORAL
  Filled 2024-05-21: qty 1

## 2024-05-21 MED ORDER — LIDOCAINE 2% (20 MG/ML) 5 ML SYRINGE
INTRAMUSCULAR | Status: DC | PRN
  Administered 2024-05-21: 13:00:00 100 mg via INTRAVENOUS

## 2024-05-21 MED ORDER — METOPROLOL TARTRATE 25 MG PO TABS: 25.0000 mg | ORAL_TABLET | Freq: Two times a day (BID) | ORAL | Status: DC

## 2024-05-21 SURGICAL SUPPLY — 1 items: PAD DEFIB RADIO PHYSIO CONN (PAD) ×1 IMPLANT

## 2024-05-21 NOTE — Progress Notes (Signed)
 PROGRESS NOTE    Ashley Mayer  FMW:[id number] DOB: Dec 07, 1940 DOA: 05/17/2024 PCP: Rolinda Millman, MD   Brief Narrative: 83 year old with past medical history significant for paroxysmal A-fib, recently diagnosed and started on metoprolol , Eliquis  by cardiology, hypertension, hyperlipidemia, peripheral vascular disease, emphysema, bronchiectasis presented to the ED 12/11 for evaluation of dizziness, shortness of breath and headache.  Symptoms started at 9 AM the day of admission which feels similar to previous vertigo spell.  Reports symptoms are getting more frequent.  Of note patient during her pulmonary rehab on 12/9 was found to be in A-fib with RVR heart rate 130s at 148.  She was asymptomatic at that time.  She was seen by cardiology clinic the day prior to admission and she was started on Lopressor .  In the ED heart rate was 112, troponin 16, COVID RSV PCR negative.  UDS negative.  CT head without contrast no acute intracranial abnormality.  MRI without contrast showed no acute intra cranial abnormality, EKG A-fib with RVR.  She was started on Cardizem  drip.  Cardiology consulted.  Admitted for further evaluation of A-fib RVR   Assessment & Plan:   Principal Problem:   Atrial fibrillation with RVR (HCC) Active Problems:   Dizziness   Essential hypertension   Mixed hyperlipidemia   Pulmonary emphysema (HCC)  1-Paroxysmal A-fib with RVR Hypertension Patient presented with dizziness, shortness of breath and headache.  Recently diagnosed with A-fib.  Recently started on Eliquis . On admission she was found to be in A-fib RVR heart rate 130s 140s.  Started on Cardizem  drip. Transthoracic echo ejection fraction 45 to 50%, global hypokinesis. Cardiology consulted. Transition to oral Cardizem . Continue metoprolol  Continue Eliquis  S/P pst TEE cardioversion today, tolerated procedure well  Emphysema/bronchiectasis As needed albuterol  Continue Incruse Ellipta   CKD stage  IIIa Stable.  Hyperlipidemia Continue Crestor   Peripheral vascular disease Recent ABI abnormal continue follow-up as an outpatient  Celiac disease and lactose intolerance: Nutritionist consulted.    Estimated body mass index is 24.41 kg/m as calculated from the following:   Height as of this encounter: 5' (1.524 m).   Weight as of this encounter: 56.7 kg.   DVT prophylaxis: Eliquis  Code Status: Full code Family Communication: Son who was at bedside Disposition Plan:  Status is: Observation The patient remains OBS appropriate and will d/c before 2 midnights.    Consultants:  Cardiology  Procedures:  Echo  Antimicrobials:    Subjective: She is doing well, just came from Cardioversion. Denies dyspnea or chest pain. Report mild headaches  Objective: Vitals:   05/20/24 1833 05/20/24 2004 05/21/24 0437 05/21/24 0759  BP:  103/76 104/77   Pulse:  90 (!) 42   Resp:  13 16   Temp:  98 F (36.7 C) 97.9 F (36.6 C)   TempSrc:  Oral Oral   SpO2: 99% 98% 97% 96%  Weight:      Height:        Intake/Output Summary (Last 24 hours) at 05/21/2024 0801 Last data filed at 05/21/2024 0515 Gross per 24 hour  Intake 840.25 ml  Output --  Net 840.25 ml   Filed Weights   05/17/24 1901  Weight: 56.7 kg    Examination:  General exam: NAD  Respiratory system: CTA Cardiovascular system: S 1, S 2 bradycardia Gastrointestinal system: BS present, soft, nt Central nervous system: alert, conversant.  Extremities: no edema   Data Reviewed: I have personally reviewed following labs and imaging studies  CBC: Recent Labs  Lab 05/17/24 1136  05/21/24 0418  WBC 7.6 7.0  NEUTROABS 5.8  --   HGB 11.7* 10.9*  HCT 35.3* 33.5*  MCV 98.1 101.2*  PLT 302 250   Basic Metabolic Panel: Recent Labs  Lab 05/17/24 1136 05/17/24 1925 05/18/24 0501 05/19/24 0542 05/20/24 1132 05/21/24 0418  NA 134*  --  134* 140 135 136  K 5.0  --  3.9 3.9 4.4 4.0  CL 97*  --  101 105 103  104  CO2 24  --  22 23 24 23   GLUCOSE 110*  --  94 91 90 101*  BUN 18  --  16 14 22 22   CREATININE 1.26*  --  1.10* 1.11* 1.08* 1.11*  CALCIUM  10.2  --  9.0 9.2 9.5 9.1  MG  --  2.1  --  2.5*  --   --    GFR: Estimated Creatinine Clearance: 29.8 mL/min (A) (by C-G formula based on SCr of 1.11 mg/dL (H)). Liver Function Tests: Recent Labs  Lab 05/17/24 1136  AST 33  ALT 27  ALKPHOS 70  BILITOT 0.6  PROT 7.3  ALBUMIN 4.5   No results for input(s): LIPASE, AMYLASE in the last 168 hours. No results for input(s): AMMONIA in the last 168 hours. Coagulation Profile: Recent Labs  Lab 05/17/24 1136  INR 1.2   Cardiac Enzymes: No results for input(s): CKTOTAL, CKMB, CKMBINDEX, TROPONINI in the last 168 hours. BNP (last 3 results) No results for input(s): PROBNP in the last 8760 hours. HbA1C: No results for input(s): HGBA1C in the last 72 hours. CBG: Recent Labs  Lab 05/17/24 1127 05/20/24 0758  GLUCAP 95 110*   Lipid Profile: No results for input(s): CHOL, HDL, LDLCALC, TRIG, CHOLHDL, LDLDIRECT in the last 72 hours. Thyroid  Function Tests: No results for input(s): TSH, T4TOTAL, FREET4, T3FREE, THYROIDAB in the last 72 hours.  Anemia Panel: No results for input(s): VITAMINB12, FOLATE, FERRITIN, TIBC, IRON, RETICCTPCT in the last 72 hours. Sepsis Labs: No results for input(s): PROCALCITON, LATICACIDVEN in the last 168 hours.  Recent Results (from the past 240 hours)  Resp panel by RT-PCR (RSV, Flu A&B, Covid) Anterior Nasal Swab     Status: None   Collection Time: 05/17/24 12:07 PM   Specimen: Anterior Nasal Swab  Result Value Ref Range Status   SARS Coronavirus 2 by RT PCR NEGATIVE NEGATIVE Final    Comment: (NOTE) SARS-CoV-2 target nucleic acids are NOT DETECTED.  The SARS-CoV-2 RNA is generally detectable in upper respiratory specimens during the acute phase of infection. The lowest concentration of  SARS-CoV-2 viral copies this assay can detect is 138 copies/mL. A negative result does not preclude SARS-Cov-2 infection and should not be used as the sole basis for treatment or other patient management decisions. A negative result may occur with  improper specimen collection/handling, submission of specimen other than nasopharyngeal swab, presence of viral mutation(s) within the areas targeted by this assay, and inadequate number of viral copies(<138 copies/mL). A negative result must be combined with clinical observations, patient history, and epidemiological information. The expected result is Negative.  Fact Sheet for Patients:  bloggercourse.com  Fact Sheet for Healthcare Providers:  seriousbroker.it  This test is no t yet approved or cleared by the [place]  FDA and  has been authorized for detection and/or diagnosis of SARS-CoV-2 by FDA under an Emergency Use Authorization (EUA). This EUA will remain  in effect (meaning this test can be used) for the duration of the COVID-19 declaration under Section 564(b)(1) of the  Act, 21 U.S.C.section 360bbb-3(b)(1), unless the authorization is terminated  or revoked sooner.       Influenza A by PCR NEGATIVE NEGATIVE Final   Influenza B by PCR NEGATIVE NEGATIVE Final    Comment: (NOTE) The Xpert Xpress SARS-CoV-2/FLU/RSV plus assay is intended as an aid in the diagnosis of influenza from Nasopharyngeal swab specimens and should not be used as a sole basis for treatment. Nasal washings and aspirates are unacceptable for Xpert Xpress SARS-CoV-2/FLU/RSV testing.  Fact Sheet for Patients: bloggercourse.com  Fact Sheet for Healthcare Providers: seriousbroker.it  This test is not yet approved or cleared by the [place]  FDA and has been authorized for detection and/or diagnosis of SARS-CoV-2 by FDA under an Emergency Use  Authorization (EUA). This EUA will remain in effect (meaning this test can be used) for the duration of the COVID-19 declaration under Section 564(b)(1) of the Act, 21 U.S.C. section 360bbb-3(b)(1), unless the authorization is terminated or revoked.     Resp Syncytial Virus by PCR NEGATIVE NEGATIVE Final    Comment: (NOTE) Fact Sheet for Patients: bloggercourse.com  Fact Sheet for Healthcare Providers: seriousbroker.it  This test is not yet approved or cleared by the [place]  FDA and has been authorized for detection and/or diagnosis of SARS-CoV-2 by FDA under an Emergency Use Authorization (EUA). This EUA will remain in effect (meaning this test can be used) for the duration of the COVID-19 declaration under Section 564(b)(1) of the Act, 21 U.S.C. section 360bbb-3(b)(1), unless the authorization is terminated or revoked.  Performed at [facility], [address] [place], [place] [address]          Radiology Studies: No results found.       Scheduled Meds:  apixaban   2.5 mg Oral BID   diltiazem   120 mg Oral Daily   feeding supplement (KATE FARMS STANDARD 1.4) Liquid  325 mL Oral BID BM   metoprolol  tartrate  25 mg Oral TID   rosuvastatin   20 mg Oral Daily   umeclidinium bromide   1 puff Inhalation Daily   Continuous Infusions:  sodium chloride  20 mL/hr at 05/21/24 0515     LOS: 0 days    Time spent: 35 minutes    Nancy Manuele A Gwynn Crossley, MD [facility]   If 7PM-7AM, please contact night-coverage [email or web address]  05/21/2024, 8:01 AM

## 2024-05-21 NOTE — Interval H&P Note (Signed)
 History and Physical Interval Note:  05/21/2024 10:50 AM  Avelina DELENA Peng  has presented today for surgery, with the diagnosis of afib.  The various methods of treatment have been discussed with the patient and family. After consideration of risks, benefits and other options for treatment, the patient has consented to  Procedures: TRANSESOPHAGEAL ECHOCARDIOGRAM (N/A) CARDIOVERSION (N/A) as a surgical intervention.  The patient's history has been reviewed, patient examined, no change in status, stable for surgery.  I have reviewed the patient's chart and labs.  Questions were answered to the patient's satisfaction.    NPO for TEE/DCCV.  Signed, Darryle DASEN. Barbaraann, MD, [facility]  [facility]  [address] [place], [place] [address] [phone]  10:50 AM

## 2024-05-21 NOTE — Anesthesia Preprocedure Evaluation (Addendum)
 Anesthesia Evaluation  Patient identified by MRN, date of birth, ID band Patient awake    Reviewed: Allergy & Precautions, NPO status , Patient's Chart, lab work & pertinent test results  History of Anesthesia Complications Negative for: history of anesthetic complications  Airway Mallampati: I  TM Distance: >3 FB Neck ROM: Full    Dental  (+) Dental Advisory Given, Upper Dentures   Pulmonary neg shortness of breath, neg sleep apnea, COPD,  COPD inhaler, neg recent URI   Pulmonary exam normal breath sounds clear to auscultation       Cardiovascular hypertension (metoprolol ), Pt. on home beta blockers (-) angina (-) Past MI, (-) Cardiac Stents and (-) CABG + dysrhythmias Atrial Fibrillation + Valvular Problems/Murmurs (mild-to-moderate MR and TR)  Rhythm:Regular Rate:Normal  HLD  TTE 05/18/2024: IMPRESSIONS    1. Left ventricular ejection fraction, by estimation, is 45 to 50%. The  left ventricle has mildly decreased function. The left ventricle  demonstrates global hypokinesis. Left ventricular diastolic parameters are  indeterminate.   2. Right ventricular systolic function is mildly reduced. The right  ventricular size is mildly enlarged. There is mildly elevated pulmonary  artery systolic pressure. The estimated right ventricular systolic  pressure is 43.3 mmHg.   3. Left atrial size was mildly dilated.   4. Right atrial size was moderately dilated.   5. The mitral valve is abnormal. Mild to moderate mitral valve  regurgitation. No evidence of mitral stenosis.   6. Tricuspid valve regurgitation is mild to moderate.   7. The aortic valve is tricuspid. There is moderate calcification of the  aortic valve. Aortic valve regurgitation is not visualized. No aortic  stenosis is present.   8. The inferior vena cava is normal in size with <50% respiratory  variability, suggesting right atrial pressure of 8 mmHg.   9. The patient  was in atrial fibrillation.     Neuro/Psych negative neurological ROS     GI/Hepatic Neg liver ROS,GERD  Medicated,,IBS, celiac disease, diverticulosis    Endo/Other  negative endocrine ROS    Renal/GU CRFRenal disease     Musculoskeletal  (+) Arthritis ,  Osteopenia    Abdominal   Peds  Hematology  (+) Blood dyscrasia, anemia Lab Results      Component                Value               Date                      WBC                      7.0                 05/21/2024                HGB                      10.9 (L)            05/21/2024                HCT                      33.5 (L)            05/21/2024                MCV  101.2 (H)           05/21/2024                PLT                      250                 05/21/2024              Anesthesia Other Findings Last Eliquis : this morning  Reproductive/Obstetrics                              Anesthesia Physical Anesthesia Plan  ASA: 3  Anesthesia Plan: MAC   Post-op Pain Management: Minimal or no pain anticipated   Induction: Intravenous  PONV Risk Score and Plan: 2 and Propofol  infusion, TIVA and Treatment may vary due to age or medical condition  Airway Management Planned: Natural Airway and Nasal Cannula  Additional Equipment:   Intra-op Plan:   Post-operative Plan:   Informed Consent: I have reviewed the patients History and Physical, chart, labs and discussed the procedure including the risks, benefits and alternatives for the proposed anesthesia with the patient or authorized representative who has indicated his/her understanding and acceptance.     Dental advisory given  Plan Discussed with: CRNA and Anesthesiologist  Anesthesia Plan Comments: (Discussed with patient risks of MAC including, but not limited to, minor pain or discomfort, hearing people in the room, and possible need for backup general anesthesia. Risks for general anesthesia also  discussed including, but not limited to, sore throat, hoarse voice, chipped/damaged teeth, injury to vocal cords, nausea and vomiting, allergic reactions, lung infection, heart attack, stroke, and death. All questions answered. )         Anesthesia Quick Evaluation

## 2024-05-21 NOTE — Transfer of Care (Signed)
 Immediate Anesthesia Transfer of Care Note  Patient: Ashley Mayer  Procedure(s) Performed: TRANSESOPHAGEAL ECHOCARDIOGRAM CARDIOVERSION  Patient Location: Cath Lab  Anesthesia Type:General  Level of Consciousness: awake, alert , and oriented  Airway & Oxygen Therapy: Patient Spontanous Breathing and Patient connected to nasal cannula oxygen  Post-op Assessment: Report given to RN and Post -op Vital signs reviewed and stable  Post vital signs: Reviewed and stable  Last Vitals:  Vitals Value Taken Time  BP 102/56 05/21/24 13:16  Temp 36 1318  Pulse 49 05/21/24 13:18  Resp 20 05/21/24 13:18  SpO2 97 % 05/21/24 13:18  Vitals shown include unfiled device data.  Last Pain:  Vitals:   05/21/24 1053  TempSrc: Temporal  PainSc:       Patients Stated Pain Goal: 0 (05/20/24 0845)  Complications: No notable events documented.

## 2024-05-21 NOTE — CV Procedure (Signed)
° °  TRANSESOPHAGEAL ECHOCARDIOGRAM GUIDED DIRECT CURRENT CARDIOVERSION  NAME:  Ashley Mayer    MRN: [id number] DOB:  Dec 29, 1940    ADMIT DATE: 05/17/2024  INDICATIONS: Symptomatic atrial fibrillation   PROCEDURE:   Informed consent was obtained prior to the procedure. The risks, benefits and alternatives for the procedure were discussed and the patient comprehended these risks.  Risks include, but are not limited to, cough, sore throat, vomiting, nausea, somnolence, esophageal and stomach trauma or perforation, bleeding, low blood pressure, aspiration, pneumonia, infection, trauma to the teeth and death.    After a procedural time-out, the oropharynx was anesthetized and the patient was sedated by the anesthesia service. The transesophageal probe was inserted in the esophagus and stomach without difficulty and multiple views were obtained. Anesthesia was monitored by Dr. Peggye.   COMPLICATIONS:    Complications: No complications Patient tolerated procedure well.  KEY FINDINGS:  LVEF 40-45%.  Moderate to Severe MR No LAA thrombus.  Full Report to follow.   CARDIOVERSION:     Indications:  Symptomatic Atrial Fibrillation  Procedure Details:  Once the TEE was complete, the patient had the defibrillator pads placed in the anterior and posterior position. Once an appropriate level of sedation was confirmed, the patient was cardioverted x 1 with 200J of biphasic synchronized energy.  The patient converted to NSR.  There were no apparent complications.  The patient had normal neuro status and respiratory status post procedure with vitals stable as recorded elsewhere.  Adequate airway was maintained throughout and vital signs monitored per protocol.  Signed, Darryle DASEN. Barbaraann, MD, [facility]  [facility]  [address] [place], [place] [address] [phone]  1:06 PM

## 2024-05-21 NOTE — Progress Notes (Signed)
 Progress Note  Patient Name: Ashley Mayer Date of Encounter: 05/21/2024 [facility] Cardiologist: Shelda Bruckner, MD   Interval Summary   Still in atrial fibrillation this morning.  Plan for TEE/DCCV.  Family is accompanied with her in the room.  She is not having any complaints today no chest pain or shortness of breath.  Vital Signs Vitals:   05/20/24 1833 05/20/24 2004 05/21/24 0437 05/21/24 0759  BP:  103/76 104/77   Pulse:  90 (!) 42   Resp:  13 16   Temp:  98 F (36.7 C) 97.9 F (36.6 C)   TempSrc:  Oral Oral   SpO2: 99% 98% 97% 96%  Weight:      Height:        Intake/Output Summary (Last 24 hours) at 05/21/2024 1017 Last data filed at 05/21/2024 0515 Gross per 24 hour  Intake 600.25 ml  Output --  Net 600.25 ml      05/17/2024    7:01 PM 05/16/2024    3:30 PM 05/15/2024    2:32 PM  Last 3 Weights  Weight (lbs) 125 lb 125 lb 126 lb 8.7 oz  Weight (kg) 56.7 kg 56.7 kg 57.4 kg      Telemetry/ECG  Atrial fibrillation heart rates between 100-140- Personally Reviewed  Physical Exam  GEN: No acute distress.   Neck: No JVD Cardiac: IRRR, no murmurs, rubs, or gallops.  Respiratory: Clear to auscultation bilaterally. GI: Soft, nontender, non-distended  MS: No edema  Patient Profile Patient with past medical history significant for hypertension, hyperlipidemia, CKD, emphysema/bronchiectasis.  Outpatient patient was noted to be in A-fib RVR 12/9 while with pulmonary rehab.  Admitted to the hospital for RVR and complaints of headache/dizziness with SOB.  Assessment & Plan   Newly diagnosed atrial fibrillation RVR Rates have been fluctuating, at times uncontrolled between 100-140.  Currently asymptomatic and unaware of her arrhythmia but did have fatigue and dizziness upon arrival.  Plan for TEE/DCCV today. Continue Eliquis  2.5 mg twice daily for age and weight. Continue diltiazem  120 mg (okay by MD with mildly reduced EF), continue  Lopressor  25 mg 3 times daily.  Suspect we can cut this back or stop after her procedure given underlying pulmonary disease.  Will defer to MD. TSH normal Screen for OSA outpatient.  No obvious secondary etiologies currently.  New heart failure with mildly reduced EF Echocardiogram this admission demonstrates an EF of 45 to 50%, mildly reduced RV.  RVSP 43.3.  Mildly dilated LA, moderately dilated RA.  Mild to moderate MR.  Mild to moderate TR.  Cardiomyopathy likely secondary to RVR and should improve after TEE/DCCV.  Looks euvolemic right now. Would repeat echocardiogram after her DCCV.  We can titrate GDMT tomorrow.  Emphysema/bronchiectasis Follows with pulmonology and working with pulmonology rehab.  Abnormal ABIs 05/2024 ABIs demonstrating moderate disease bilaterally.  Follow-up outpatient, she has very excellent functional capacity and still working.  She reports other symptoms that do not necessarily sound like true claudication symptoms, but with obvious disease.  I think managing medically for now is reasonable. No aspirin  with DOAC.  Continue rosuvastatin  20 mg. Repeat LDL here.  Goal less than 70.  Celiac disease and lactose intolerance Family has requested that her allergies have been reviewed to ensure that there are no contraindications with medications she is getting for her procedure.  Informed Consent   Shared Decision Making/Informed Consent{  The risks [stroke, cardiac arrhythmias rarely resulting in the need for a temporary or permanent  pacemaker, skin irritation or burns, esophageal damage, perforation (1:10,000 risk), bleeding, pharyngeal hematoma as well as other potential complications associated with conscious sedation including aspiration, arrhythmia, respiratory failure and death], benefits (treatment guidance, restoration of normal sinus rhythm, diagnostic support) and alternatives of a transesophageal echocardiogram guided cardioversion were discussed in detail  with Ms. Ruzich and she is willing to proceed.        For questions or updates, please contact [facility] Please consult [email or web address] for contact info under       Signed, Thom LITTIE Sluder, PA-C

## 2024-05-21 NOTE — Plan of Care (Signed)
°  Problem: Clinical Measurements: Goal: Ability to maintain clinical measurements within normal limits will improve Outcome: Progressing   Problem: Nutrition: Goal: Adequate nutrition will be maintained Outcome: Progressing   Problem: Elimination: Goal: Will not experience complications related to bowel motility Outcome: Progressing   Problem: Elimination: Goal: Will not experience complications related to urinary retention Outcome: Progressing   Problem: Pain Managment: Goal: General experience of comfort will improve and/or be controlled Outcome: Progressing   Problem: Safety: Goal: Ability to remain free from injury will improve Outcome: Progressing

## 2024-05-21 NOTE — Anesthesia Postprocedure Evaluation (Signed)
 Anesthesia Post Note  Patient: Ashley Mayer  Procedure(s) Performed: TRANSESOPHAGEAL ECHOCARDIOGRAM CARDIOVERSION     Patient location during evaluation: PACU Anesthesia Type: MAC Level of consciousness: awake Pain management: pain level controlled Vital Signs Assessment: post-procedure vital signs reviewed and stable Respiratory status: spontaneous breathing, nonlabored ventilation and respiratory function stable Cardiovascular status: stable and blood pressure returned to baseline Postop Assessment: no apparent nausea or vomiting Anesthetic complications: no   There were no known notable events for this encounter.  Last Vitals:  Vitals:   05/21/24 1400 05/21/24 1410  BP:    Pulse:  (!) 49  Resp: 15 (!) 21  Temp:    SpO2:  98%    Last Pain:  Vitals:   05/21/24 1053  TempSrc: Temporal  PainSc:                  Delon Aisha Arch

## 2024-05-21 NOTE — Progress Notes (Signed)
 PT Cancellation Note  Patient Details Name: Ashley Mayer MRN: [id number] DOB: [birth year]   Cancelled Treatment:    Reason Eval/Treat Not Completed: Medical issues which prohibited therapy, post TEE this day. Order For vestibular eval, will see 12/16.  Ashley Mayer PT Acute Rehabilitation Services Office [phone]    [name] 05/21/2024, 4:41 PM

## 2024-05-22 ENCOUNTER — Other Ambulatory Visit (HOSPITAL_COMMUNITY): Payer: Self-pay

## 2024-05-22 ENCOUNTER — Encounter (HOSPITAL_COMMUNITY): Payer: Self-pay | Admitting: Cardiovascular Disease

## 2024-05-22 ENCOUNTER — Encounter (HOSPITAL_COMMUNITY)

## 2024-05-22 DIAGNOSIS — I4891 Unspecified atrial fibrillation: Secondary | ICD-10-CM | POA: Diagnosis not present

## 2024-05-22 LAB — BASIC METABOLIC PANEL WITH GFR
Anion gap: 10 (ref 5–15)
BUN: 19 mg/dL (ref 8–23)
CO2: 23 mmol/L (ref 22–32)
Calcium: 9.2 mg/dL (ref 8.9–10.3)
Chloride: 105 mmol/L (ref 98–111)
Creatinine, Ser: 1.12 mg/dL — ABNORMAL HIGH (ref 0.44–1.00)
GFR, Estimated: 48 mL/min — ABNORMAL LOW (ref >60)
Glucose, Bld: 96 mg/dL (ref 70–99)
Potassium: 4.6 mmol/L (ref 3.5–5.1)
Sodium: 138 mmol/L (ref 135–145)

## 2024-05-22 LAB — LIPID PANEL
Cholesterol: 106 mg/dL (ref 0–200)
HDL: 36 mg/dL — ABNORMAL LOW (ref >40)
LDL Cholesterol: 46 mg/dL (ref 0–99)
Total CHOL/HDL Ratio: 3 ratio
Triglycerides: 120 mg/dL (ref <150)
VLDL: 24 mg/dL (ref 0–40)

## 2024-05-22 LAB — VITAMIN B12: Vitamin B-12: 911 pg/mL (ref 180–914)

## 2024-05-22 MED ORDER — DIPHENHYDRAMINE HCL 25 MG PO CAPS
25.0000 mg | ORAL_CAPSULE | Freq: Four times a day (QID) | ORAL | Status: DC | PRN
  Administered 2024-05-22: 09:00:00 25 mg via ORAL
  Filled 2024-05-22: qty 1

## 2024-05-22 MED ORDER — BISOPROLOL FUMARATE 5 MG PO TABS: 5.0000 mg | ORAL_TABLET | Freq: Every day | ORAL | Status: DC

## 2024-05-22 MED ORDER — BISOPROLOL FUMARATE 5 MG PO TABS
5.0000 mg | ORAL_TABLET | Freq: Every day | ORAL | 0 refills | Status: DC
  Filled 2024-05-22: qty 30, 30d supply, fill #0

## 2024-05-22 MED ORDER — METOPROLOL SUCCINATE ER 50 MG PO TB24: 25.0000 mg | ORAL_TABLET | Freq: Every day | ORAL | Status: DC

## 2024-05-22 NOTE — Progress Notes (Signed)
 Discharge med in a secure bag delivered to patient in room by this RN. Patient asked if medication was gluten free- pharmacist and this RN confirmed it was after checking med ingredients online

## 2024-05-22 NOTE — Progress Notes (Addendum)
 Progress Note  Patient Name: Ashley Mayer Date of Encounter: 05/22/2024 [facility] Cardiologist: Shelda Bruckner, MD   Interval Summary   Status post successful TEE/DCCV yesterday.  Maintaining sinus rhythm no complaints of chest pain or shortness of breath.  Vital Signs Vitals:   05/21/24 2040 05/22/24 0533 05/22/24 0845 05/22/24 0909  BP: 107/75 (!) 108/56    Pulse: (!) 52 (!) 59  (!) 59  Resp: 17 18    Temp: 97.9 F (36.6 C) 97.7 F (36.5 C)    TempSrc: Oral Oral    SpO2: 100% 98% 98%   Weight:      Height:        Intake/Output Summary (Last 24 hours) at 05/22/2024 1004 Last data filed at 05/21/2024 2044 Gross per 24 hour  Intake 1040 ml  Output 0 ml  Net 1040 ml      05/17/2024    7:01 PM 05/16/2024    3:30 PM 05/15/2024    2:32 PM  Last 3 Weights  Weight (lbs) 125 lb 125 lb 126 lb 8.7 oz  Weight (kg) 56.7 kg 56.7 kg 57.4 kg      Telemetry/ECG  Sinus rhythm heart rate around 60- Personally Reviewed  Physical Exam  GEN: No acute distress.   Neck: No JVD Cardiac: RRR, no murmurs, rubs, or gallops.  Respiratory: Clear to auscultation bilaterally. GI: Soft, nontender, non-distended  MS: No edema  Patient Profile Patient with past medical history significant for hypertension, hyperlipidemia, CKD, emphysema/bronchiectasis.  Outpatient patient was noted to be in A-fib RVR 12/9 while with pulmonary rehab.  Admitted to the hospital for RVR and complaints of headache/dizziness with SOB.  Assessment & Plan   Newly diagnosed atrial fibrillation RVR -05/21/2024 status post successful TEE/DCCV due to difficulty to control rates.   Underwent cardioversion yesterday which was successful.  Still maintaining sinus rhythm.  TEE read EF 40 to 45% with mildly reduced RV and severely dilated LA/RA.  Moderate to severe MR. We will see if she maintains sinus long-term.  Continue Eliquis  2.5 mg twice daily for age and weight. Blood pressure has  been soft after cardioversion.  Already got her diltiazem  today but will stop this and her Lopressor  25 mg twice daily (has not been getting due to Hrs) and start her on bisoprolol  5 mg for GDMT.   TSH normal Screen for OSA outpatient.  No obvious secondary etiologies currently. If she has early recurrence of atrial fibrillation may need to send to EP for further evaluation/options.  New heart failure with mildly reduced EF Mitral regurgitation -Echocardiogram this admission demonstrates an EF of 45 to 50%, mildly reduced RV.  RVSP 43.3.  Mildly dilated LA, moderately dilated RA.  Mild to moderate MR.  Mild to moderate TR.    Cardiomyopathy likely secondary to RVR and should improve now that she is back in sinus rhythm.  Euvolemic and with NYHA class I symptoms. Would repeat echocardiogram outpatient in the next 2-3 months to check MR and recovery of EF hopefully. MR hopefully to improve now back in sinus.  Asymptomatic with excellent functional capacity.  Likely no plans for intervention at this time. See above. Starting bisoprolol  5 mg daily.  BP too soft for anything else at this time. We discussed starting SGLT2 inhibitor for CKD and heart failure.  She would like to think about this more.  Does not normally have UTIs/yeast infections but says at times she can have urinary symptoms.  Emphysema/bronchiectasis Follows with pulmonology and  working with pulmonology rehab.  Abnormal ABIs 05/2024 ABIs demonstrating moderate disease bilaterally.  Follow-up outpatient, she has very excellent functional capacity and still working.  She reports other symptoms that do not necessarily sound like true claudication symptoms, but with obvious disease.  I think managing medically for now is reasonable. No aspirin  with DOAC.  Continue rosuvastatin  20 mg. LDL this admission 46 at our goal less than 70.  CAC 03/2022 CAC score 573.  No anginal complaints here.  Continue rosuvastatin .  Celiac disease and  lactose intolerance Noted.   Will arrange follow-up.  For questions or updates, please contact [facility] Please consult [email or web address] for contact info under       Signed, Ashley LITTIE Sluder, PA-C     Personally seen and examined. Agree with above.  Successful cardioversion maintaining sinus rhythm.  Continue with Eliquis  2.5 mg.  Acute systolic heart failure with a EF 40 to 45% as well as moderate to severe mitral regurgitation.  Perhaps functional however EF not severely reduced.  Repeating echocardiogram in the next 2 to 3 months.  Starting bisoprolol  5 mg a day.  Soft BP.  On rosuvastatin  given coronary calcium  score of 573.  DOAC monotherapy.  Ashley Parchment, MD

## 2024-05-22 NOTE — Evaluation (Signed)
 Physical Therapy Evaluation Patient Details Name: Ashley Mayer MRN: [id number] DOB: 1941-01-20 Today's Date: 05/22/2024  History of Present Illness  Pt presented on 05/17/24 with dizziness, SOB, and headache.  Pt found to have paroxysmal afib with RVR. Pt s/p successful TEE/DCCV on 05/21/24.  Noted in cardiology note potential for BPPV.  MRI unremarkable.  Pt with hx including but not limited to recently dx Afib and started on metoprolol  and eliquis , HTN, HLD, PVD, emphysema/bronchiectasis.  Clinical Impression  Pt admitted with above diagnosis.  At baseline, pt active and independent.  She has home support and only 2 steps to enter home.  Pt's dizziness seems to have resolved and she felt related to afib.  Vestibular screening unremarkable.  Pt ambulating 300' and performed stairs at supervision level with VSS and no dizziness.  Pt appears near baseline.  No further PT needs.         If plan is discharge home, recommend the following:     Can travel by private vehicle        Equipment Recommendations None recommended by PT  Recommendations for Other Services       Functional Status Assessment Patient has not had a recent decline in their functional status     Precautions / Restrictions Precautions Precautions: Fall      Mobility  Bed Mobility Overal bed mobility: Independent                  Transfers Overall transfer level: Needs assistance Equipment used: None Transfers: Sit to/from Stand Sit to Stand: Supervision                Ambulation/Gait Ambulation/Gait assistance: Supervision Gait Distance (Feet): 300 Feet Assistive device: None Gait Pattern/deviations: WFL(Within Functional Limits)       General Gait Details: normal gait at normal speed; steady ; no LOB ; no dizziness; HR 60's rest and 90's walking  Stairs Stairs: Yes Stairs assistance: Contact guard assist Stair Management: One rail Right, Alternating pattern, Forwards Number of  Stairs: 3 General stair comments: performed wtihout difficulty ; no dizziness  Wheelchair Mobility     Tilt Bed    Modified Rankin (Stroke Patients Only)       Balance Overall balance assessment: Independent Sitting-balance support: No upper extremity supported Sitting balance-Leahy Scale: Good     Standing balance support: No upper extremity supported Standing balance-Leahy Scale: Good                               Pertinent Vitals/Pain Pain Assessment Pain Assessment: No/denies pain    Home Living Family/patient expects to be discharged to:: Private residence   Available Help at Discharge: Family (son) Type of Home: House Home Access: Stairs to enter Entrance Stairs-Rails: None (can hold door frame) Entrance Stairs-Number of Steps: 2   Home Layout: One level Home Equipment: Grab bars - tub/shower;Shower seat      Prior Function Prior Level of Function : Independent/Modified Independent             Mobility Comments: Could ambulate in community without AD ADLs Comments: independent with adls and iadls including cleaning and getting up leaves Pt also does cardiopulmonary rehab twice weekly and 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    Extremity/Trunk Assessment   Upper Extremity Assessment Upper Extremity Assessment: Overall WFL for tasks assessed    Lower Extremity Assessment Lower Extremity Assessment: Overall WFL for tasks assessed  Cervical / Trunk Assessment Cervical / Trunk Assessment: Kyphotic;Other exceptions Cervical / Trunk Exceptions: forward head  Communication        Cognition Arousal: Alert Behavior During Therapy: WFL for tasks assessed/performed   PT - Cognitive impairments: No apparent impairments                                 Cueing       General Comments   Vestibular Screen:  History: Pt had some difficulty providing exact timeline of dizziness and described vague symptoms.  She  felt the timeline likely correlated with afib onset.  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  Described as spinning, lightheaded, SOB, and nauseated taking at least 40 mins to resolve.  States these episodes have typically occurred when up and active, but does reports occurs sometimes when she bends over and stands up.  Denies with supine or rolling or head turns, but later reports she is prone to vertigo and has had some dizziness when on cruise ship or with head turns in pas that only last about 30 seconds. .   Asked pt when she felt this episode resolved and she was not exactly able to state but son present and reports pt had no complaints when walking with dtr yesterday after converted to NSR.    Seems likely to be related to afib but did continue screen as pt reports hx of spinning and vertigo that only last a few seconds.   Extraoculor motor exam: nystagmus, gaze induced nystagmus, head turns, head shake, head thrust, smooth pursuit, and gaze stabilization all normal/negative/unremarkable.  [employer] and Horizontal Roll: negative.   Exercises     Assessment/Plan    PT Assessment Patient does not need any further PT services  PT Problem List         PT Treatment Interventions      PT Goals (Current goals can be found in the Care Plan section)  Acute Rehab PT Goals Patient Stated Goal: return home PT Goal Formulation: All assessment and education complete, DC therapy    Frequency       Co-evaluation               AM-PAC PT 6 Clicks Mobility  Outcome Measure Help needed turning from your back to your side while in a flat bed without using bedrails?: None Help needed moving from lying on your back to sitting on the side of a flat bed without using bedrails?: None Help needed moving to and from a bed to a chair (including a wheelchair)?: None Help needed standing up from a chair using your  arms (e.g., wheelchair or bedside chair)?: None Help needed to walk in hospital room?: A Little Help needed climbing 3-5 steps with a railing? : A Little 6 Click Score: 22    End of Session Equipment Utilized During Treatment: Gait belt Activity Tolerance: Patient tolerated treatment well Patient left: in bed;with call bell/phone within reach;with bed alarm set;with family/visitor present Nurse Communication: Mobility status PT Visit Diagnosis: Dizziness and giddiness (R42)    Time: 8974-8947 PT Time Calculation (min) (ACUTE ONLY): 27 min   Charges:   PT Evaluation $PT Eval Low Complexity: 1 Low PT Treatments $Gait Training: 8-22 mins PT General Charges $$ ACUTE PT VISIT: 1 Visit  Ashley, PT Acute Rehab [facility] [phone]   Ashley Mayer 05/22/2024, 11:02 AM

## 2024-05-22 NOTE — Discharge Summary (Signed)
 Physician Discharge Summary   Patient: Ashley Mayer MRN: [id number] DOB: January 24, 1941  Admit date:     05/17/2024  Discharge date: 05/22/2024  Discharge Physician: Owen LABOR Lore   PCP: Rolinda Millman, MD   Recommendations at discharge:   Needs follow up with cardiology for further care of arrhythmia.    Discharge Diagnoses: Principal Problem:   Atrial fibrillation with RVR (HCC) Active Problems:   Dizziness   Essential hypertension   Mixed hyperlipidemia   Pulmonary emphysema (HCC)  Resolved Problems:   * No resolved hospital problems. [facility] Course: 83 year old with past medical history significant for paroxysmal A-fib, recently diagnosed and started on metoprolol , Eliquis  by cardiology, hypertension, hyperlipidemia, peripheral vascular disease, emphysema, bronchiectasis presented to the ED 12/11 for evaluation of dizziness, shortness of breath and headache.  Symptoms started at 9 AM the day of admission which feels similar to previous vertigo spell.  Reports symptoms are getting more frequent.   Of note patient during her pulmonary rehab on 12/9 was found to be in A-fib with RVR heart rate 130s at 148.  She was asymptomatic at that time.  She was seen by cardiology clinic the day prior to admission and she was started on Lopressor .   In the ED heart rate was 112, troponin 16, COVID RSV PCR negative.  UDS negative.  CT head without contrast no acute intracranial abnormality.  MRI without contrast showed no acute intra cranial abnormality, EKG A-fib with RVR.  She was started on Cardizem  drip.  Cardiology consulted.  Admitted for further evaluation of A-fib RVR    Assessment and Plan: 1-Paroxysmal A-fib with RVR Hypertension -Patient presented with dizziness, shortness of breath and headache.  Recently diagnosed with A-fib.  Recently started on Eliquis . Cardiology consulted.  -On admission she was found to be in A-fib RVR heart rate 130s 140s.  Started on Cardizem   drip. Subsequently transition to oral Cardizem  and metoprolol . Post cardioversion HR down to 60, and SBP soft. Plan to discontinue Cardizem . She will be -started on low dose bisoprolol   -Transthoracic echo ejection fraction 45 to 50%, global hypokinesis. -Continue Eliquis  S/P pst TEE cardioversion 12/15, tolerated procedure well   Emphysema/bronchiectasis As needed albuterol  Continue Incruse Ellipta    CKD stage IIIa Stable.   Hyperlipidemia Continue Crestor    Peripheral vascular disease Recent ABI abnormal continue follow-up as an outpatient   Celiac disease and lactose intolerance: Nutritionist consulted.      Estimated body mass index is 24.41 kg/m as calculated from the following:   Height as of this encounter: 5' (1.524 m).   Weight as of this encounter: 56.7 kg.           Consultants:Cardiology  Procedures performed: TEE cardioversion.  Disposition: Home Diet recommendation:  Cardiac diet DISCHARGE MEDICATION: Allergies as of 05/22/2024       Reactions   Codeine Other (See Comments)   hallucinations   Wheat Diarrhea   Celiac    Lactose Intolerance (gi) Diarrhea   Milk (cow)    Other reaction(s): Not available   Shrimp Extract Nausea And Vomiting   Tomato Diarrhea   Morphine  And Codeine Other (See Comments)   Unknown-family history of allergy   Tape Itching, Rash        Medication List     STOP taking these medications    aspirin  EC 81 MG tablet   metoprolol  tartrate 25 MG tablet Commonly known as: LOPRESSOR        TAKE these medications    albuterol   108 (90 Base) MCG/ACT inhaler Commonly known as: Ventolin  HFA Inhale 2 puffs into the lungs every 6 (six) hours as needed for wheezing or shortness of breath.   bisoprolol  5 MG tablet Commonly known as: ZEBETA  Take 1 tablet (5 mg total) by mouth daily. Start taking on: May 23, 2024   Eliquis  2.5 MG Tabs tablet Generic drug: apixaban  Take 1 tablet (2.5 mg total) by mouth 2 (two)  times daily.   fluticasone  50 MCG/ACT nasal spray Commonly known as: FLONASE  Place 2 sprays into both nostrils daily. What changed:  when to take this reasons to take this   Incruse Ellipta  62.5 MCG/ACT Aepb Generic drug: umeclidinium bromide  Inhale 1 puff into the lungs daily.   multivitamin-lutein  Caps capsule Take 1 capsule by mouth daily.   pantoprazole  40 MG tablet Commonly known as: PROTONIX  Take 40 mg by mouth daily as needed (indigestion).   PROBIOTIC DAILY PO Take 1 tablet by mouth daily at 12 noon.   rosuvastatin  20 MG tablet Commonly known as: CRESTOR  Take 1 tablet by mouth once daily   VITAMIN A PO Take 2,400 mcg by mouth daily at 12 noon.   VITAMIN B-12 PO Takes 2,500 mg by mouth occasionally   VITAMIN B6 PO Takes 100 mg by mouth occasionally   Vitamin D3 1000 units Caps Take 1 tablet by mouth daily.   Zinc 50 MG Tabs Take 1 tablet by mouth daily.        Discharge Exam: Filed Weights   05/17/24 1901  Weight: 56.7 kg   General; NAD  Condition at discharge: stable  The results of significant diagnostics from this hospitalization (including imaging, microbiology, ancillary and laboratory) are listed below for reference.   Imaging Studies: ECHO TEE Result Date: 05/21/2024    TRANSESOPHOGEAL ECHO REPORT   Patient Name:   Ashley Mayer Date of Exam: 05/21/2024 Medical Rec #:  [id number]          Height:       60.0 in Accession #:    [id number]         Weight:       125.0 lb Date of Birth:  02-26-1941          BSA:          1.529 m 83 years           BP:           118/96 mmHg Patient Gender: F                  HR:           122 bpm. Exam Location:  Inpatient Procedure: Transesophageal Echo, 3D Echo, Cardiac Doppler and Color Doppler            (Both Spectral and Color Flow Doppler were utilized during            procedure). Indications:    Arrhythmia  History:        Patient has prior history of Echocardiogram examinations, most                  recent 05/18/2024. Arrythmias:Atrial Fibrillation; Risk                 Factors:Hypertension.  Sonographer:    Jayson Gaskins Referring Phys: [id number] DARRYLE NED O'NEAL PROCEDURE: After discussion of the risks and benefits of a TEE, an informed consent was obtained from the patient. TEE procedure time was 10  minutes. The transesophogeal probe was passed without difficulty through the esophogus of the patient. Sedation performed by different physician. The patient was monitored while under deep sedation. Anesthestetic sedation was provided intravenously by Anesthesiology: 100mg  of Propofol , 275mg  of Lidocaine . Image quality was excellent. The patient's vital signs; including heart rate, blood pressure, and oxygen saturation; remained stable throughout the procedure. The patient developed no complications during the procedure. A successful direct current cardioversion was performed at 200 joules with 1 attempt.  IMPRESSIONS  1. Left ventricular ejection fraction, by estimation, is 40 to 45%. The left ventricle has mildly decreased function. The left ventricle demonstrates global hypokinesis.  2. Right ventricular systolic function is mildly reduced. The right ventricular size is mildly enlarged.  3. Left atrial size was severely dilated. No left atrial/left atrial appendage thrombus was detected.  4. Right atrial size was severely dilated.  5. Moderate to severe mitral regurgitation is present. Atrial functional. 2D ERO 0.11 cm2, R vol 18 cc. The mitral valve is abnormal. Moderate to severe mitral valve regurgitation. No evidence of mitral stenosis.  6. The aortic valve is tricuspid. There is mild calcification of the aortic valve. Aortic valve regurgitation is trivial. Aortic valve sclerosis is present, with no evidence of aortic valve stenosis.  7. 3D performed of the mitral valve and demonstrates moderate to severe mitral regurgitation. Conclusion(s)/Recommendation(s): No LA/LAA thrombus identified.  Successful cardioversion performed with restoration of normal sinus rhythm. FINDINGS  Left Ventricle: Left ventricular ejection fraction, by estimation, is 40 to 45%. The left ventricle has mildly decreased function. The left ventricle demonstrates global hypokinesis. The left ventricular internal cavity size was normal in size. Right Ventricle: The right ventricular size is mildly enlarged. No increase in right ventricular wall thickness. Right ventricular systolic function is mildly reduced. Left Atrium: Left atrial size was severely dilated. No left atrial/left atrial appendage thrombus was detected. Right Atrium: Right atrial size was severely dilated. Pericardium: There is no evidence of pericardial effusion. Mitral Valve: Moderate to severe mitral regurgitation is present. Atrial functional. 2D ERO 0.11 cm2, R vol 18 cc. The mitral valve is abnormal. Moderate to severe mitral valve regurgitation. No evidence of mitral valve stenosis. Tricuspid Valve: The tricuspid valve is grossly normal. Tricuspid valve regurgitation is mild . No evidence of tricuspid stenosis. Aortic Valve: The aortic valve is tricuspid. There is mild calcification of the aortic valve. Aortic valve regurgitation is trivial. Aortic valve sclerosis is present, with no evidence of aortic valve stenosis. Pulmonic Valve: The pulmonic valve was grossly normal. Pulmonic valve regurgitation is trivial. No evidence of pulmonic stenosis. Aorta: The aortic root, ascending aorta and aortic arch are all structurally normal, with no evidence of dilitation or obstruction. There is minimal (Grade I) layered plaque involving the descending aorta. IAS/Shunts: No atrial level shunt detected by color flow Doppler. Additional Comments: Spectral Doppler performed.  AORTA Ao Root diam: 2.83 cm Ao Asc diam:  2.95 cm Darryle Decent MD Electronically signed by Darryle Decent MD Signature Date/Time: 05/21/2024/2:04:55 PM    Final    EP STUDY Result Date:  05/21/2024 See surgical note for result.  ECHOCARDIOGRAM COMPLETE Result Date: 05/18/2024    ECHOCARDIOGRAM REPORT   Patient Name:   AMEN DARGIS Rawson Date of Exam: 05/18/2024 Medical Rec #:  [id number]          Height:       60.0 in Accession #:    [id number]         Weight:  125.0 lb Date of Birth:  1941/02/25          BSA:          1.529 m 83 years           BP:           108/65 mmHg Patient Gender: F                  HR:           100 bpm. Exam Location:  Inpatient Procedure: 2D Echo, Cardiac Doppler and Color Doppler (Both Spectral and Color            Flow Doppler were utilized during procedure). Indications:    Atrial Fibrillation I48.91  History:        Patient has prior history of Echocardiogram examinations, most                 recent 03/22/2022. CKD, Arrythmias:Atrial Fibrillation; Risk                 Factors:Dyslipidemia and Hypertension.  Sonographer:    Koleen Popper RDCS Referring Phys: [id number] KELLY A GRIFFITH  Sonographer Comments: Image acquisition challenging due to patient body habitus. IMPRESSIONS  1. Left ventricular ejection fraction, by estimation, is 45 to 50%. The left ventricle has mildly decreased function. The left ventricle demonstrates global hypokinesis. Left ventricular diastolic parameters are indeterminate.  2. Right ventricular systolic function is mildly reduced. The right ventricular size is mildly enlarged. There is mildly elevated pulmonary artery systolic pressure. The estimated right ventricular systolic pressure is 43.3 mmHg.  3. Left atrial size was mildly dilated.  4. Right atrial size was moderately dilated.  5. The mitral valve is abnormal. Mild to moderate mitral valve regurgitation. No evidence of mitral stenosis.  6. Tricuspid valve regurgitation is mild to moderate.  7. The aortic valve is tricuspid. There is moderate calcification of the aortic valve. Aortic valve regurgitation is not visualized. No aortic stenosis is present.  8. The  inferior vena cava is normal in size with <50% respiratory variability, suggesting right atrial pressure of 8 mmHg.  9. The patient was in atrial fibrillation. FINDINGS  Left Ventricle: Left ventricular ejection fraction, by estimation, is 45 to 50%. The left ventricle has mildly decreased function. The left ventricle demonstrates global hypokinesis. The left ventricular internal cavity size was normal in size. There is  no left ventricular hypertrophy. Left ventricular diastolic parameters are indeterminate. Right Ventricle: The right ventricular size is mildly enlarged. No increase in right ventricular wall thickness. Right ventricular systolic function is mildly reduced. There is mildly elevated pulmonary artery systolic pressure. The tricuspid regurgitant  velocity is 2.97 m/s, and with an assumed right atrial pressure of 8 mmHg, the estimated right ventricular systolic pressure is 43.3 mmHg. Left Atrium: Left atrial size was mildly dilated. Right Atrium: Right atrial size was moderately dilated. Pericardium: There is no evidence of pericardial effusion. Mitral Valve: The mitral valve is abnormal. There is mild calcification of the mitral valve leaflet(s). Mild mitral annular calcification. Mild to moderate mitral valve regurgitation. No evidence of mitral valve stenosis. Tricuspid Valve: The tricuspid valve is normal in structure. Tricuspid valve regurgitation is mild to moderate. Aortic Valve: The aortic valve is tricuspid. There is moderate calcification of the aortic valve. Aortic valve regurgitation is not visualized. No aortic stenosis is present. Pulmonic Valve: The pulmonic valve was normal in structure. Pulmonic valve regurgitation is not visualized. Aorta:  The aortic root is normal in size and structure. Venous: The inferior vena cava is normal in size with less than 50% respiratory variability, suggesting right atrial pressure of 8 mmHg. IAS/Shunts: No atrial level shunt detected by color flow  Doppler.  LEFT VENTRICLE PLAX 2D LVIDd:         3.90 cm LVIDs:         2.90 cm LV PW:         0.70 cm LV IVS:        0.80 cm LVOT diam:     1.80 cm LV SV:         39 LV SV Index:   25 LVOT Area:     2.54 cm  LV Volumes (MOD) LV vol d, MOD A2C: 58.9 ml LV vol s, MOD A2C: 26.8 ml LV SV MOD A2C:     32.1 ml RIGHT VENTRICLE          IVC RV Basal diam:  3.60 cm  IVC diam: 2.00 cm TAPSE (M-mode): 1.5 cm LEFT ATRIUM             Index        RIGHT ATRIUM           Index LA diam:        4.00 cm 2.62 cm/m   RA Area:     16.30 cm LA Vol (A2C):   30.1 ml 19.69 ml/m  RA Volume:   41.40 ml  27.08 ml/m LA Vol (A4C):   33.0 ml 21.59 ml/m LA Biplane Vol: 33.5 ml 21.92 ml/m  AORTIC VALVE LVOT Vmax:   79.60 cm/s LVOT Vmean:  52.000 cm/s LVOT VTI:    0.153 m  AORTA Ao Root diam: 2.60 cm Ao Asc diam:  3.20 cm MR Peak grad: 70.8 mmHg   TRICUSPID VALVE MR Mean grad: 46.0 mmHg   TR Peak grad:   35.3 mmHg MR Vmax:      420.75 cm/s TR Mean grad:   25.0 mmHg MR Vmean:     322.0 cm/s  TR Vmax:        297.00 cm/s                           TR Vmean:       244.0 cm/s                            SHUNTS                           Systemic VTI:  0.15 m                           Systemic Diam: 1.80 cm Dalton McleanMD Electronically signed by Ezra Kanner Signature Date/Time: 05/18/2024/4:15:11 PM    Final    MR BRAIN WO CONTRAST Result Date: 05/17/2024 EXAM: MRI Brain Without Contrast 05/17/2024 06:58:00 PM TECHNIQUE: Multiplanar multisequence MRI of the head/brain was performed without the administration of intravenous contrast. COMPARISON: CT head from earlier today. CLINICAL HISTORY: Transient ischemic attack (TIA); Dizziness and headache FINDINGS: BRAIN AND VENTRICLES: No acute infarct. No intracranial hemorrhage. No mass. No midline shift. No hydrocephalus. The sella is unremarkable. Normal flow voids. Moderate T2 hyperintensities in the white matter, compatible with small vessel ischemic disease. ORBITS: No acute abnormality.  SINUSES AND MASTOIDS: No  acute abnormality. BONES AND SOFT TISSUES: Normal marrow signal. No acute soft tissue abnormality. IMPRESSION: 1. No acute intracranial abnormality. 2. Moderate small vessel ischemic disease. Electronically signed by: Gilmore Molt 05/17/2024 10:06 PM EST RP Workstation: [id number]   DG Chest Portable 1 View Result Date: 05/17/2024 EXAM: XR Chest, 1 View(s). CLINICAL HISTORY: afib COMPARISON: 11/30/2022 FINDINGS: LUNGS: The lungs are clear. No consolidation. PLEURAL SPACES: No pleural effusion or pneumothorax. HEART: The heart size is normal. BONES: No acute osseous abnormality. IMPRESSION: 1. No acute cardiopulmonary abnormality. Electronically signed by: Lynwood Seip MD 05/17/2024 02:15 PM EST RP Workstation: [id number]   CT HEAD WO CONTRAST Result Date: 05/17/2024 EXAM: CT Head Without Intravenous Contrast. CLINICAL HISTORY: 83 year old female with acute neurologic deficit, stroke suspected, and headache and dizziness onset 0900 hours. TECHNIQUE: Axial computed tomography images of the head/brain without intravenous contrast. Dose reduction technique was used including one or more of the following: automated exposure control, adjustment of mA and kV according to patient size, and/or iterative reconstruction. CONTRAST: Without. COMPARISON: Head CT 10/11/2014. FINDINGS: BRAIN: Brain volume within normal limits for age. 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 Calcified atherosclerosis at the skull base. No acute intraparenchymal hemorrhage. No mass lesion. No CT evidence for acute territorial infarct. No midline shift or extra-axial collection. No suspicious intracranial vascular hyperdensity. VENTRICLES: No hydrocephalus. ORBITS: The orbits are unremarkable. SINUSES AND MASTOIDS: Minor paranasal sinus mucosal thickening, regressed since the previous exam. Tympanic cavities  and mastoids appear clear. SOFT TISSUES: No acute orbital scalp soft tissue finding. No radiopaque foreign body is seen. BONES: No acute skull fracture. IMPRESSION: 1. No acute intracranial abnormality. 2. Moderately advanced chronic small vessel disease. Electronically signed by: Helayne Hurst MD 05/17/2024 12:57 PM EST RP Workstation: [id number]   VAS US  ABI WITH/WO TBI Result Date: 05/07/2024  LOWER EXTREMITY DOPPLER STUDY Patient Name:  Keary Hanak [place]  Date of Exam:   05/07/2024 Medical Rec #: [id number]           Accession #:    [id number] Date of Birth: 07-06-1940           Patient Gender: F Patient Age:   56 years Exam Location:  [facility] Procedure:      VAS US  ABI WITH/WO TBI Referring Phys: --------------------------------------------------------------------------------  Indications: Claudication. patient reports for the past year intermittent              bilateral ankle pain that radiates with walking about 5 minutes.              Patient states the pain occurs more often in the last few years but              does happen every time she walks. Patient stated anytime she tries              to grocery shop she has to stop and rest while shopping. Patient              denies any rest pain. Patient states while having her varicose              veins ablated she was informed she had some plaque in her legs. High Risk Factors: Hypertension, hyperlipidemia, no history of smoking.  Comparison Study: NA Performing Technologist: Orvil Holmes RDCS  Examination Guidelines: A complete evaluation includes at minimum, Doppler waveform signals and systolic blood pressure reading at the level of bilateral brachial,  anterior tibial, and posterior tibial arteries, when vessel segments are accessible. Bilateral testing is considered an integral part of a complete examination. Photoelectric Plethysmograph (PPG) waveforms and toe systolic pressure readings are included as required and additional duplex testing as  needed. Limited examinations for reoccurring indications may be performed as noted.  ABI Findings: +---------+------------------+-----+----------+--------+ Right    Rt Pressure (mmHg)IndexWaveform  Comment  +---------+------------------+-----+----------+--------+ Brachial 160                                       +---------+------------------+-----+----------+--------+ PTA      64                0.39 monophasic         +---------+------------------+-----+----------+--------+ DP       112               0.69 monophasic         +---------+------------------+-----+----------+--------+ Great Toe75                0.46 Abnormal           +---------+------------------+-----+----------+--------+ +---------+------------------+-----+----------+-------+ Left     Lt Pressure (mmHg)IndexWaveform  Comment +---------+------------------+-----+----------+-------+ Brachial 163                                      +---------+------------------+-----+----------+-------+ PTA      82                0.50 monophasic        +---------+------------------+-----+----------+-------+ DP       104               0.64 monophasic        +---------+------------------+-----+----------+-------+ Great Toe74                0.45 Abnormal          +---------+------------------+-----+----------+-------+ +-------+-----------+-----------+------------+------------+ ABI/TBIToday's ABIToday's TBIPrevious ABIPrevious TBI +-------+-----------+-----------+------------+------------+ Right  0.69       0.46                                +-------+-----------+-----------+------------+------------+ Left   0.64       0.45                                +-------+-----------+-----------+------------+------------+   Summary: Right: Resting right ankle-brachial index indicates moderate right lower extremity arterial disease. The right toe-brachial index is abnormal.  Left: Resting left  ankle-brachial index indicates moderate left lower extremity arterial disease. The left toe-brachial index is abnormal.  *See table(s) above for measurements and observations.  Suggest Peripheral Vascular Consult. Electronically signed by Deatrice Cage MD on 05/07/2024 at 4:56:31 PM.    Final     Microbiology: Results for orders placed or performed during the hospital encounter of 05/17/24  Resp panel by RT-PCR (RSV, Flu A&B, Covid) Anterior Nasal Swab     Status: None   Collection Time: 05/17/24 12:07 PM   Specimen: Anterior Nasal Swab  Result Value Ref Range Status   SARS Coronavirus 2 by RT PCR NEGATIVE NEGATIVE Final    Comment: (NOTE) SARS-CoV-2 target nucleic acids are NOT DETECTED.  The SARS-CoV-2 RNA is generally detectable in upper respiratory specimens during the  acute phase of infection. The lowest concentration of SARS-CoV-2 viral copies this assay can detect is 138 copies/mL. A negative result does not preclude SARS-Cov-2 infection and should not be used as the sole basis for treatment or other patient management decisions. A negative result may occur with  improper specimen collection/handling, submission of specimen other than nasopharyngeal swab, presence of viral mutation(s) within the areas targeted by this assay, and inadequate number of viral copies(<138 copies/mL). A negative result must be combined with clinical observations, patient history, and epidemiological information. The expected result is Negative.  Fact Sheet for Patients:  bloggercourse.com  Fact Sheet for Healthcare Providers:  seriousbroker.it  This test is no t yet approved or cleared by the [place]  FDA and  has been authorized for detection and/or diagnosis of SARS-CoV-2 by FDA under an Emergency Use Authorization (EUA). This EUA will remain  in effect (meaning this test can be used) for the duration of the COVID-19 declaration under  Section 564(b)(1) of the Act, 21 U.S.C.section 360bbb-3(b)(1), unless the authorization is terminated  or revoked sooner.       Influenza A by PCR NEGATIVE NEGATIVE Final   Influenza B by PCR NEGATIVE NEGATIVE Final    Comment: (NOTE) The Xpert Xpress SARS-CoV-2/FLU/RSV plus assay is intended as an aid in the diagnosis of influenza from Nasopharyngeal swab specimens and should not be used as a sole basis for treatment. Nasal washings and aspirates are unacceptable for Xpert Xpress SARS-CoV-2/FLU/RSV testing.  Fact Sheet for Patients: bloggercourse.com  Fact Sheet for Healthcare Providers: seriousbroker.it  This test is not yet approved or cleared by the [place]  FDA and has been authorized for detection and/or diagnosis of SARS-CoV-2 by FDA under an Emergency Use Authorization (EUA). This EUA will remain in effect (meaning this test can be used) for the duration of the COVID-19 declaration under Section 564(b)(1) of the Act, 21 U.S.C. section 360bbb-3(b)(1), unless the authorization is terminated or revoked.     Resp Syncytial Virus by PCR NEGATIVE NEGATIVE Final    Comment: (NOTE) Fact Sheet for Patients: bloggercourse.com  Fact Sheet for Healthcare Providers: seriousbroker.it  This test is not yet approved or cleared by the [place]  FDA and has been authorized for detection and/or diagnosis of SARS-CoV-2 by FDA under an Emergency Use Authorization (EUA). This EUA will remain in effect (meaning this test can be used) for the duration of the COVID-19 declaration under Section 564(b)(1) of the Act, 21 U.S.C. section 360bbb-3(b)(1), unless the authorization is terminated or revoked.  Performed at [facility], [address], [place], [place] [address]     Labs: CBC: Recent Labs  Lab 05/17/24 1136 05/21/24 0418  WBC 7.6 7.0  NEUTROABS  5.8  --   HGB 11.7* 10.9*  HCT 35.3* 33.5*  MCV 98.1 101.2*  PLT 302 250   Basic Metabolic Panel: Recent Labs  Lab 05/17/24 1925 05/18/24 0501 05/19/24 0542 05/20/24 1132 05/21/24 0418 05/22/24 0513  NA  --  134* 140 135 136 138  K  --  3.9 3.9 4.4 4.0 4.6  CL  --  101 105 103 104 105  CO2  --  22 23 24 23 23   GLUCOSE  --  94 91 90 101* 96  BUN  --  16 14 22 22 19   CREATININE  --  1.10* 1.11* 1.08* 1.11* 1.12*  CALCIUM   --  9.0 9.2 9.5 9.1 9.2  MG 2.1  --  2.5*  --   --   --  Liver Function Tests: Recent Labs  Lab 05/17/24 1136  AST 33  ALT 27  ALKPHOS 70  BILITOT 0.6  PROT 7.3  ALBUMIN 4.5   CBG: Recent Labs  Lab 05/17/24 1127 05/20/24 0758  GLUCAP 95 110*    Discharge time spent: greater than 30 minutes.  Signed: Owen DELENA Lore, MD [facility] 05/22/2024

## 2024-05-22 NOTE — TOC Transition Note (Signed)
 Transition of Care [facility]) - Discharge Note   Patient Details  Name: Ashley Mayer MRN: [id number] Date of Birth: 1941-04-21  [facility]) CM/SW Contact:  Bascom Service, RN Phone Number: 05/22/2024, 11:13 AM   Clinical Narrative:  d/c home no CM needs or orders.     Final next level of care: Home/Self Care Barriers to Discharge: No Barriers Identified   Patient Goals and CMS Choice Patient states their goals for this hospitalization and ongoing recovery are:: Home CMS Medicare.gov Compare Post Acute Care list provided to:: Patient Choice offered to / list presented to : Patient [place] ownership interest in [place].provided to:: Patient    Discharge Placement                       Discharge Plan and Services Additional resources added to the After Visit Summary for     Discharge Planning Services: CM Consult Post Acute Care Choice: Resumption of Svcs/PTA Provider                               Social Drivers of Health (SDOH) Interventions SDOH Screenings   Food Insecurity: No Food Insecurity (05/17/2024)  Housing: High Risk (05/17/2024)  Transportation Needs: Unmet Transportation Needs (05/17/2024)  Utilities: Not At Risk (05/17/2024)  Depression (PHQ2-9): Low Risk (03/23/2024)  Social Connections: Moderately Integrated (05/17/2024)  Tobacco Use: Low Risk (05/18/2024)     Readmission Risk Interventions     No data to display

## 2024-05-22 NOTE — Plan of Care (Signed)
°  Problem: Clinical Measurements: Goal: Ability to maintain clinical measurements within normal limits will improve Outcome: Progressing   Problem: Elimination: Goal: Will not experience complications related to bowel motility Outcome: Progressing   Problem: Elimination: Goal: Will not experience complications related to urinary retention Outcome: Progressing   Problem: Pain Managment: Goal: General experience of comfort will improve and/or be controlled Outcome: Progressing   Problem: Safety: Goal: Ability to remain free from injury will improve Outcome: Progressing   Problem: Cardiac: Goal: Ability to achieve and maintain adequate cardiopulmonary perfusion will improve Outcome: Progressing   Problem: Activity: Goal: Ability to tolerate increased activity will improve Outcome: Progressing

## 2024-05-24 ENCOUNTER — Encounter (HOSPITAL_COMMUNITY)
Admission: RE | Admit: 2024-05-24 | Discharge: 2024-05-24 | Disposition: A | Source: Ambulatory Visit | Attending: Pulmonary Disease

## 2024-05-24 ENCOUNTER — Telehealth (HOSPITAL_COMMUNITY): Payer: Self-pay

## 2024-05-24 DIAGNOSIS — J432 Centrilobular emphysema: Secondary | ICD-10-CM

## 2024-05-24 NOTE — Telephone Encounter (Signed)
 Pt called Pulm Rehab stated she was in the hospital for a week for Afib. Pt has follow up appointment next week with cardiology. Instructed pt to get clearance from cardiology before she comes back to class. Pt agreed upon plan.

## 2024-05-28 NOTE — Progress Notes (Signed)
 " Cardiology Office Note   Date:  05/30/2024  ID:  Ashley Mayer, DOB [birth year], MRN [id number] PCP: Rolinda Millman, MD  [facility] Providers Cardiologist:  Shelda Bruckner, MD     [facility] Hypertension Hyperlipidemia Emphysema/bronchiectasis  Seen remotely for chest pain and DOE, she established care with Dr. Bruckner on 04/23/2024.  Echo 2023 with EF 50 to 55%, normal RV, mild to moderate MR, aortic sclerosis without stenosis.  Calcium  score in 2023 was 573 (70th percentile).  She is followed by pulmonology for emphysema/bronchiectasis.  Strong family history of CAD.  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 Catherine at [facility].  She sometimes has sharp shooting pain from her toes to her calves both at rest and with exertion intermittently.  She walks a lot and denies claudication but does note cramping that improves with rest.  She was at pulmonary rehab on 05/15/2024 when she was found to be in A-fib RVR with heart rate in the 130s to 140s.  She denied lightheadedness, fatigue, or shortness of breath.  She noted a recent episode of weakness that resolved quickly.  She was unaware of palpitations or tachycardia.  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  CHA2DS2-VASc score is 4 (?5) based on vascular disease, sex and age.  History of hypertension is reported but she is not currently on antihypertensive therapy.  Seen in clinic by me on 05/16/24, accompanied by her son, Wilkie, and her daughter, who lives an hour and a half away.. She remained asymptomatic but staff detected an irregular heart rate and stopped her workout. She describes feeling weevily wobbly and dizzy with these episodes and occasionally has a butterfly sensation in her chest, especially when upset. Noted facial flushing with exertion and recent episode of vertigo. Was previously on antihypertensive therapy, but stopped the medication due to well  controlled BP.  Family history is notable for heart disease, including siblings with atrial fibrillation and a brother who required a heart transplant. History of subarachnoid hemorrhage and multiple prior head injuries but not due to falls. Active at home but reports nocturnal leg cramps and intermittent leg discomfort. Is considering cutting back work hours because stress seems to worsen her symptoms. No chest pain, shortness of breath, edema, fatigue, palpitations, presyncope, syncope, orthopnea, and PND. Her family feels that her stamina and breathing have declined recently, noting that she has to stop and rest after walking 30 yards at home.   Admission 12/11-12/16/25 with A-fib RVR with HR 130s to 140s.  She developed headache and dizziness with shortness of breath at home and went to ER.  She underwent successful TEE cardioversion with TEE read EF 40 to 45%, mildly reduced RV, and severely dilated LA/RA, moderate to severe MR.  She was euvolemic on exam with NYHA class I symptoms.  Recommendation to recheck echo in 2 to 3 months.  She was continued on Eliquis  2.5 mg twice daily and advised to start bisoprolol  5 mg daily, BP too soft for any additional medications.  Recommendation to start SGLT2 inhibitor for CKD and heart failure, but she wanted to think about this more because she has urinary symptoms that are concerning at times.  ABIs 05/2024 demonstrated moderate disease bilaterally.  She had good functional status.  No aspirin  given DOAC, advised to continue rosuvastatin  20 mg daily.  History of Present Illness Discussed the use of AI scribe software for clinical note transcription  with the patient, who gave verbal consent to proceed.  History of Present Illness Ashley Mayer is a very pleasant 83 year old female who presents for hospital follow-up. Since discharge, she notes difficulty breathing at night.  This is noted as chest heaviness for which she uses her rescue inhaler and then  subsequently develops nasal congestion.  She had a particularly difficult time last night.  She denies PND.  Is sleeping on 4 small pillows which her son states is about a 5 degree elevation.  No edema.  Weight has been stable at home.  She denies chest pain or shortness of breath with exertion.  She feels that shortness of breath was symptom associated with atrial fibrillation. She denies lightheadedness, dizziness, presyncope or syncope.  She has exertional dyspnea with activities like climbing stairs and occasionally feels lightheaded when hurrying.  No bleeding problems.  Admits that she often salts her food heavily, but is working to reduce sodium. Admits a fondness for [employer] nuggets and has a limited diet due to lactose and gluten allergy. Her daughter feels GI symptoms improved while off supplements during hospitalization.   ROS: See HPI  Studies Reviewed EKG Interpretation Date/Time:  Tuesday May 29 2024 15:20:27 EST Ventricular Rate:  47 PR Interval:  130 QRS Duration:  88 QT Interval:  504 QTC Calculation: 446 R Axis:   81  Text Interpretation: Sinus bradycardia When compared with ECG of 21-May-2024 13:15, T wave amplitude has decreased in Anterior leads Confirmed by Percy Browning [phone]) on 05/29/2024 3:24:51 PM    No results found for: LIPOA  Risk Assessment/Calculations  CHA2DS2-VASc Score = 4   This indicates a 4.8% annual risk of stroke. The patient's score is based upon: CHF History: 0 HTN History: 0 Diabetes History: 0 Stroke History: 0 Vascular Disease History: 1 Age Score: 2 Gender Score: 1            Physical Exam VS:  BP (!) 126/56   Pulse (!) 56   Ht 5' (1.524 m)   Wt 125 lb 12.8 oz (57.1 kg)   SpO2 92%   BMI 24.57 kg/m    Wt Readings from Last 3 Encounters:  05/29/24 125 lb 12.8 oz (57.1 kg)  05/17/24 125 lb (56.7 kg)  05/16/24 125 lb (56.7 kg)    GEN: Well nourished, well developed in no acute distress NECK: No JVD; No carotid  bruits CARDIAC: Irregular RR, no murmurs, rubs, gallops RESPIRATORY:  Clear to auscultation without rales, wheezing or rhonchi  ABDOMEN: Soft, non-tender, non-distended EXTREMITIES:  No edema; No deformity    Assessment & Plan Cardiomyopathy HFrEF TTE 05/18/24 revealed mildly reduced LVEF 45 to 50%, global hypokinesis, indeterminate diastolic parameters, mildly elevated PASP, moderate RAE, mild to moderate MR, mild to moderate TR. TEE 05/21/14 read EF 40 to 45% with mildly reduced RV and severely dilated LA/RA, moderate to severe MR.  RVSP 43.3. Cardiomyopathy felt likely secondary to RVR. She is euvolemic on exam today with NYHA class I symptoms. Weight has been stable at home. No edema or PND.  She does report symptoms concerning for orthopnea on 05/28/24.  We are reducing bisoprolol  in the setting of sinus bradycardia.  Recommend SGLT2 inhibitor for heart failure. Reviewed common side effects, monitoring of urinary symptoms with patient and family.  She is agreeable to proceed with starting Jardiance . - Start Jardiance  10 mg once daily - Decrease bisoprolol  to 2.5 mg daily - Reduced sodium diet - Daily weights, report weight gain and/or  symptoms of orthopnea, PND, edema, or dyspnea - Return in 1 month for close follow-up  Persistent atrial fibrillation  Bradycardia Chronic anticoagulation  Recent diagnosis of a fib 05/15/24 noting occasional dizziness, some activity intolerance recently, and occasional fluttering sensation in chest.  She was started on Eliquis  2.5 mg twice daily for stroke prevention and metoprolol  for rate control.  Plan was to follow-up with A-fib clinic for potential cardioversion, however unfortunately she went to the hospital on 05/17/2024 with AF RVR.  She underwent TEE/DCCV on 12/15 and remains in sinus rhythm today, although she is somewhat bradycardic at 47 bpm.  She was discharged on Bystolic  5 mg daily.  She denies lightheadedness, dizziness, presyncope, syncope. No  bleeding concerns. We will reduce BB in the setting of bradycardia given advanced age. We discussed potential home monitoring with Kardia mobile device.  -Reduce bisoprolol  to 2.5 mg daily for rate control - We reviewed red flag symptoms to report -Continue Eliquis  2.5 mg twice daily which is appropriate dose for stroke prevention for CHA2DS2-VASc score of 4  CAD Aortic atherosclerosis Hyperlipidemia LDL goal < 70 CT calcium  score 03/2022 of 573 (79th percentile) with bulk of calcification in LAD and LCx. Recent onset of atrial fibrillation associated with dizziness, occasional chest discomfort, and possibly worsening shortness of breath.  She does not describe chest pain that significantly worsens with exertion.  She has been participating in pulmonary rehab and has been tolerating this without concerning cardiac symptoms.  It is unclear if the symptoms started prior to onset of A-fib.  Advised we may need to pursue further ischemia evaluation in the near future.  Lipids are fairly well-controlled with LDL of 76 and triglycerides of 111 on 4//25. No indication for further ischemia evaluation at this time. Aspirin  was stopped due to need for DOAC.  - Continue bisoprolol , rosuvastatin , Eliquis  - Report concerning symptoms   Celiac disease   She has multiple food allergies including gluten. Discussed the importance of gluten-free medications. We discussed dietary modifications to avoid gluten and also reduce sodium. - Continue to avoid gluten   Essential hypertension   Blood pressure is well-controlled. The impact of heart failure medications on blood pressure was discussed. We are reducing nebivolol  in the setting of bradycardia. - Continue nebivolol  at reduced dose - Monitor for symptoms of lightheadedness, dizziness, presyncope or syncope         Dispo: 1 month with me/Keep your January appointment with A Fib clinic  Signed, Rosaline Bane, NP-C "

## 2024-05-29 ENCOUNTER — Encounter (HOSPITAL_BASED_OUTPATIENT_CLINIC_OR_DEPARTMENT_OTHER): Payer: Self-pay | Admitting: Nurse Practitioner

## 2024-05-29 ENCOUNTER — Encounter (HOSPITAL_COMMUNITY)

## 2024-05-29 ENCOUNTER — Ambulatory Visit (HOSPITAL_BASED_OUTPATIENT_CLINIC_OR_DEPARTMENT_OTHER): Admitting: Nurse Practitioner

## 2024-05-29 ENCOUNTER — Other Ambulatory Visit (HOSPITAL_BASED_OUTPATIENT_CLINIC_OR_DEPARTMENT_OTHER): Payer: Self-pay

## 2024-05-29 VITALS — BP 126/56 | HR 56 | Ht 60.0 in | Wt 125.8 lb

## 2024-05-29 DIAGNOSIS — I5020 Unspecified systolic (congestive) heart failure: Secondary | ICD-10-CM | POA: Diagnosis not present

## 2024-05-29 DIAGNOSIS — I10 Essential (primary) hypertension: Secondary | ICD-10-CM

## 2024-05-29 DIAGNOSIS — Z7901 Long term (current) use of anticoagulants: Secondary | ICD-10-CM | POA: Diagnosis not present

## 2024-05-29 DIAGNOSIS — I2510 Atherosclerotic heart disease of native coronary artery without angina pectoris: Secondary | ICD-10-CM | POA: Diagnosis not present

## 2024-05-29 DIAGNOSIS — I4819 Other persistent atrial fibrillation: Secondary | ICD-10-CM | POA: Diagnosis not present

## 2024-05-29 DIAGNOSIS — K900 Celiac disease: Secondary | ICD-10-CM | POA: Diagnosis not present

## 2024-05-29 DIAGNOSIS — I428 Other cardiomyopathies: Secondary | ICD-10-CM

## 2024-05-29 DIAGNOSIS — E785 Hyperlipidemia, unspecified: Secondary | ICD-10-CM | POA: Diagnosis not present

## 2024-05-29 DIAGNOSIS — R001 Bradycardia, unspecified: Secondary | ICD-10-CM | POA: Diagnosis not present

## 2024-05-29 MED ORDER — EMPAGLIFLOZIN 10 MG PO TABS
10.0000 mg | ORAL_TABLET | Freq: Every day | ORAL | 11 refills | Status: AC
  Filled 2024-05-29: qty 30, 30d supply, fill #0
  Filled 2024-06-27: qty 30, 30d supply, fill #1

## 2024-05-29 MED ORDER — NEBIVOLOL HCL 2.5 MG PO TABS
2.5000 mg | ORAL_TABLET | Freq: Every day | ORAL | 0 refills | Status: DC
  Filled 2024-05-29: qty 30, 30d supply, fill #0

## 2024-05-29 NOTE — Patient Instructions (Signed)
 Medication Instructions:   START Jardiance  one (1) tablet by mouth ( 10 mg) daily.   DECREASE Bystolic  one half (0.5) tablet by mouth or one (1) tablet by mouth ( 2.5 mg ) daily.   *If you need a refill on your cardiac medications before your next appointment, please call your pharmacy*  Lab Work:  None ordered.  If you have labs (blood work) drawn today and your tests are completely normal, you will receive your results only by: MyChart Message (if you have MyChart) OR A paper copy in the mail If you have any lab test that is abnormal or we need to change your treatment, we will call you to review the results.  Testing/Procedures:  None ordered.  Follow-Up: At [facility], you and your health needs are our priority.  As part of our continuing mission to provide you with exceptional heart care, our providers are all part of one team.  This team includes your primary Cardiologist (physician) and Advanced Practice Providers or APPs (Physician Assistants and Nurse Practitioners) who all work together to provide you with the care you need, when you need it.  Your next appointment:   1 month(s)  Provider:   Rosaline Bane, NP    We recommend signing up for the patient portal called MyChart.  Sign up information is provided on this After Visit Summary.  MyChart is used to connect with patients for Virtual Visits (Telemedicine).  Patients are able to view lab/test results, encounter notes, upcoming appointments, etc.  Non-urgent messages can be sent to your provider as well.   To learn more about what you can do with MyChart, go to [email or web address].   Other Instructions  Heart Failure Education: Weigh yourself EVERY morning after you go to the bathroom but before you eat or drink anything. Write this number down in a weight log/diary. If you gain 3 pounds overnight or 5 pounds in a week, call the office. Take your medicines as prescribed. If you have concerns about  your medications, please call us  before you stop taking them.  Eat low salt foods--Limit salt (sodium) to 2000 mg per day. This will help prevent your body from holding onto fluid. Read food labels as many processed foods have a lot of sodium, especially canned goods and prepackaged meats. If you would like some assistance choosing low sodium foods, we would be happy to set you up with a nutritionist. Stay as active as you can everyday. Staying active will give you more energy and make your muscles stronger. Start with 5 minutes at a time and work your way up to 30 minutes a day. Break up your activities--do some in the morning and some in the afternoon. Start with 3 days per week and work your way up to 5 days as you can.  If you have chest pain, feel short of breath, dizzy, or lightheaded, STOP. If you don't feel better after a short rest, call 911. If you do feel better, call the office to let us  know you have symptoms with exercise. Limit all fluids for the day to less than 2 liters. Fluid includes all drinks, coffee, juice, ice chips, soup, jello, and all other liquids.

## 2024-05-29 NOTE — Addendum Note (Signed)
 Encounter addended by: Claudene Augustin NOVAK, RRT on: 05/29/2024 3:10 PM  Actions taken: Vitals modified, Flowsheet data copied forward, Flowsheet accepted

## 2024-05-30 ENCOUNTER — Other Ambulatory Visit (HOSPITAL_BASED_OUTPATIENT_CLINIC_OR_DEPARTMENT_OTHER): Payer: Self-pay | Admitting: *Deleted

## 2024-05-30 MED ORDER — BISOPROLOL FUMARATE 5 MG PO TABS: 2.5000 mg | ORAL_TABLET | Freq: Every day | ORAL | Status: DC

## 2024-05-30 NOTE — Telephone Encounter (Signed)
 Per Rosaline Bane, NP wrong medication was ordered yesterday.  S/w pt's son per [facility]) is aware to decrease the Bisoprolol  ( 2.5 mg) daily and D/C Bystolic .  Pt's son is aware and medication list updated.

## 2024-05-30 NOTE — Progress Notes (Signed)
 Pulmonary Individual Treatment Plan  Patient Details  Name: Ashley Mayer MRN: [id number] Date of Birth: 1940/06/21 Referring Provider:   [name] Pulmonary Rehab Walk Test from 03/23/2024 in [facility]  Referring Provider [name]    Initial Encounter Date:  Flowsheet Row Pulmonary Rehab Walk Test from 03/23/2024 in [facility]  Date 03/23/24    Visit Diagnosis: Centrilobular emphysema (HCC)  Patient's Home Medications on Admission:  Current Medications[1]  Past Medical History: Past Medical History:  Diagnosis Date   A-fib [facility])    Anticipatory grief    Arthritis    hands (09/17/2013)   Celiac disease    CKD (chronic kidney disease), stage III (HCC)    Collagenous colitis    Dyspnea on exertion    Estrogen deficiency    GERD (gastroesophageal reflux disease)    Hand arthritis    Hearing loss    Hyperlipidemia    Hypertension    IBS (irritable bowel syndrome)    Low vitamin B12 level    Multiple food allergies    Nocturnal leg cramps    Osteopenia    Sleep disturbance    Unspecified vitamin D  deficiency    Varicose veins of bilateral lower extremities with pain     Tobacco Use: Tobacco Use History[2]  Labs: Review Flowsheet  More data exists      Latest Ref Rng & Units 02/17/2023 03/31/2023 07/08/2023 09/09/2023 05/22/2024  Labs for ITP Cardiac and Pulmonary Rehab  Cholestrol 0 - 200 mg/dL 744  827  821  859  893   LDL (calc) 0 - 99 mg/dL 829  98  897  76  46   HDL-C >40 mg/dL 38  43  45  44  36   Trlycerides <150 mg/dL 750  818  820  888  879     Capillary Blood Glucose: Lab Results  Component Value Date   GLUCAP 110 (H) 05/20/2024   GLUCAP 95 05/17/2024   GLUCAP 90 09/18/2013   GLUCAP 95 09/17/2013   GLUCAP 85 09/17/2013     Pulmonary Assessment Scores:  Pulmonary Assessment Scores     Row Name 03/23/24 1332          ADL UCSD   ADL Phase Entry     SOB Score total 13       CAT Score   CAT Score 4       mMRC Score   mMRC Score 1       UCSD: Self-administered rating of dyspnea associated with activities of daily living (ADLs) 6-point scale (0 = not at all to 5 = maximal or unable to do because of breathlessness)  Scoring Scores range from 0 to 120.  Minimally important difference is 5 units  CAT: CAT can identify the health impairment of COPD patients and is better correlated with disease progression.  CAT has a scoring range of zero to 40. The CAT score is classified into four groups of low (less than 10), medium (10 - 20), high (21-30) and very high (31-40) based on the impact level of disease on health status. A CAT score over 10 suggests significant symptoms.  A worsening CAT score could be explained by an exacerbation, poor medication adherence, poor inhaler technique, or progression of COPD or comorbid conditions.  CAT MCID is 2 points  mMRC: mMRC (Modified Medical Research [place]) Dyspnea Scale is used to assess  the degree of baseline functional disability in patients of respiratory disease due to dyspnea. No minimal important difference is established. A decrease in score of 1 point or greater is considered a positive change.   Pulmonary Function Assessment:  Pulmonary Function Assessment - 03/23/24 1332       Breath   Bilateral Breath Sounds Clear    Shortness of Breath Yes;Limiting activity          Exercise Target Goals: Exercise Program Goal: Individual exercise prescription set using results from initial 6 min walk test and THRR while considering  patients activity barriers and safety.   Exercise Prescription Goal: Initial exercise prescription builds to 30-45 minutes a day of aerobic activity, 2-3 days per week.  Home exercise guidelines will be given to patient during program as part of exercise prescription that the participant will acknowledge.  Activity Barriers  & Risk Stratification:  Activity Barriers & Cardiac Risk Stratification - 03/23/24 1335       Activity Barriers & Cardiac Risk Stratification   Activity Barriers Back Problems;Deconditioning;Muscular Weakness;Shortness of Breath          6 Minute Walk:  6 Minute Walk     Row Name 03/23/24 1421         6 Minute Walk   Phase Initial     Distance 990 feet     Walk Time 6 minutes     # of Rest Breaks 1  2:06-2:15     MPH 1.88     METS 1.62     RPE 13     Perceived Dyspnea  1     VO2 Peak 5.66     Symptoms No     Resting HR 60 bpm     Resting BP 122/60     Resting Oxygen Saturation  99 %     Exercise Oxygen Saturation  during 6 min walk 97 %     Max Ex. HR 106 bpm     Max Ex. BP 128/50     2 Minute Post BP 124/58       Interval HR   1 Minute HR 71     2 Minute HR 84     3 Minute HR 85     4 Minute HR 98     5 Minute HR 97     6 Minute HR 106     2 Minute Post HR 63     Interval Heart Rate? Yes       Interval Oxygen   Interval Oxygen? Yes     Baseline Oxygen Saturation % 99 %     1 Minute Oxygen Saturation % 97 %     1 Minute Liters of Oxygen 0 L     2 Minute Oxygen Saturation % 98 %     2 Minute Liters of Oxygen 0 L     3 Minute Oxygen Saturation % 100 %     3 Minute Liters of Oxygen 0 L     4 Minute Oxygen Saturation % 99 %     4 Minute Liters of Oxygen 0 L     5 Minute Oxygen Saturation % 100 %     5 Minute Liters of Oxygen 0 L     6 Minute Oxygen Saturation % 95 %     6 Minute Liters of Oxygen 0 L     2 Minute Post Oxygen Saturation % 100 %     2 Minute Post Liters of  Oxygen 0 L        Oxygen Initial Assessment:  Oxygen Initial Assessment - 03/23/24 1331       Home Oxygen   Home Oxygen Device None    Sleep Oxygen Prescription None    Home Exercise Oxygen Prescription None    Home Resting Oxygen Prescription None      Initial 6 min Walk   Oxygen Used None      Program Oxygen Prescription   Program Oxygen Prescription None       Intervention   Short Term Goals To learn and understand importance of maintaining oxygen saturations>88%;To learn and demonstrate proper use of respiratory medications;To learn and understand importance of monitoring SPO2 with pulse oximeter and demonstrate accurate use of the pulse oximeter.;To learn and demonstrate proper pursed lip breathing techniques or other breathing techniques.     Long  Term Goals Maintenance of O2 saturations>88%;Compliance with respiratory medication;Verbalizes importance of monitoring SPO2 with pulse oximeter and return demonstration;Exhibits proper breathing techniques, such as pursed lip breathing or other method taught during program session;Demonstrates proper use of MDIs          Oxygen Re-Evaluation:  Oxygen Re-Evaluation     Row Name 03/28/24 1454 04/26/24 0944 05/25/24 1432         Program Oxygen Prescription   Program Oxygen Prescription None None None       Home Oxygen   Home Oxygen Device None None None     Sleep Oxygen Prescription None None None     Home Exercise Oxygen Prescription None None None     Home Resting Oxygen Prescription None None None       Goals/Expected Outcomes   Short Term Goals To learn and understand importance of maintaining oxygen saturations>88%;To learn and demonstrate proper use of respiratory medications;To learn and understand importance of monitoring SPO2 with pulse oximeter and demonstrate accurate use of the pulse oximeter.;To learn and demonstrate proper pursed lip breathing techniques or other breathing techniques.  To learn and understand importance of maintaining oxygen saturations>88%;To learn and demonstrate proper use of respiratory medications;To learn and understand importance of monitoring SPO2 with pulse oximeter and demonstrate accurate use of the pulse oximeter.;To learn and demonstrate proper pursed lip breathing techniques or other breathing techniques.  To learn and understand importance of maintaining  oxygen saturations>88%;To learn and demonstrate proper use of respiratory medications;To learn and understand importance of monitoring SPO2 with pulse oximeter and demonstrate accurate use of the pulse oximeter.;To learn and demonstrate proper pursed lip breathing techniques or other breathing techniques.      Long  Term Goals Maintenance of O2 saturations>88%;Compliance with respiratory medication;Verbalizes importance of monitoring SPO2 with pulse oximeter and return demonstration;Exhibits proper breathing techniques, such as pursed lip breathing or other method taught during program session;Demonstrates proper use of MDIs Maintenance of O2 saturations>88%;Compliance with respiratory medication;Verbalizes importance of monitoring SPO2 with pulse oximeter and return demonstration;Exhibits proper breathing techniques, such as pursed lip breathing or other method taught during program session;Demonstrates proper use of MDIs Maintenance of O2 saturations>88%;Compliance with respiratory medication;Verbalizes importance of monitoring SPO2 with pulse oximeter and return demonstration;Exhibits proper breathing techniques, such as pursed lip breathing or other method taught during program session;Demonstrates proper use of MDIs     Goals/Expected Outcomes Compliance and understanding of oxygen saturation monitoring and breathing techniques to decrease shortness of breath. Compliance and understanding of oxygen saturation monitoring and breathing techniques to decrease shortness of breath. Compliance and understanding of oxygen saturation monitoring and breathing  techniques to decrease shortness of breath.        Oxygen Discharge (Final Oxygen Re-Evaluation):  Oxygen Re-Evaluation - 05/25/24 1432       Program Oxygen Prescription   Program Oxygen Prescription None      Home Oxygen   Home Oxygen Device None    Sleep Oxygen Prescription None    Home Exercise Oxygen Prescription None    Home Resting Oxygen  Prescription None      Goals/Expected Outcomes   Short Term Goals To learn and understand importance of maintaining oxygen saturations>88%;To learn and demonstrate proper use of respiratory medications;To learn and understand importance of monitoring SPO2 with pulse oximeter and demonstrate accurate use of the pulse oximeter.;To learn and demonstrate proper pursed lip breathing techniques or other breathing techniques.     Long  Term Goals Maintenance of O2 saturations>88%;Compliance with respiratory medication;Verbalizes importance of monitoring SPO2 with pulse oximeter and return demonstration;Exhibits proper breathing techniques, such as pursed lip breathing or other method taught during program session;Demonstrates proper use of MDIs    Goals/Expected Outcomes Compliance and understanding of oxygen saturation monitoring and breathing techniques to decrease shortness of breath.          Initial Exercise Prescription:  Initial Exercise Prescription - 03/23/24 1400       Date of Initial Exercise RX and Referring Provider   Date 03/23/24    Referring Provider [name]    Expected Discharge Date 06/29/23      Treadmill   MPH 1.2    Grade 0    Minutes 30    METs 1.5      Prescription Details   Frequency (times per week) 2    Duration Progress to 30 minutes of continuous aerobic without signs/symptoms of physical distress      Intensity   THRR 40-80% of Max Heartrate 54-109    Ratings of Perceived Exertion 11-13    Perceived Dyspnea 0-4      Progression   Progression Continue to progress workloads to maintain intensity without signs/symptoms of physical distress.      Resistance Training   Training Prescription Yes    Weight red bands    Reps 10-15          Perform Capillary Blood Glucose checks as needed.  Exercise Prescription Changes:   Exercise Prescription Changes     Row Name 04/03/24 1500 04/17/24 1500 04/26/24 1509 05/08/24 1508 05/15/24 1400     Response  to Exercise   Blood Pressure (Admit) 116/60 140/58 116/60 138/58 118/70   Blood Pressure (Exercise) 148/60 140/60 -- -- --   Blood Pressure (Exit) 120/40 108/68 158/58 120/58 --   Heart Rate (Admit) 64 bpm 73 bpm 76 bpm 70 bpm 129 bpm  new onset Afib   Heart Rate (Exercise) 85 bpm 98 bpm 96 bpm 85 bpm --   Heart Rate (Exit) 76 bpm 75 bpm 76 bpm 77 bpm --   Oxygen Saturation (Admit) 98 % 99 % 96 % 98 % 97 %  RA   Oxygen Saturation (Exercise) 100 % 95 % 99 % 99 % --   Oxygen Saturation (Exit) 99 % 99 % 99 % 98 % --   Rating of Perceived Exertion (Exercise) 13 13 13 13  --   Perceived Dyspnea (Exercise) 0 1 1 1  --   Duration Continue with 30 min of aerobic exercise without signs/symptoms of physical distress. Continue with 30 min of aerobic exercise without signs/symptoms of physical distress. Continue with 30 min  of aerobic exercise without signs/symptoms of physical distress. Continue with 30 min of aerobic exercise without signs/symptoms of physical distress. --   Intensity THRR unchanged THRR unchanged THRR unchanged THRR unchanged --     Progression   Progression -- -- -- Continue to progress workloads to maintain intensity without signs/symptoms of physical distress. --     Paramedic Prescription Yes Yes Yes -- --   Weight red bands red bands red bands red bands --   Reps 10-15 10-15 10-15 10-15 --   Time 10 Minutes 10 Minutes 10 Minutes 10 Minutes --     NuStep   Level -- 2 2 2  --   SPM -- 85 83 96 --   Minutes -- 15 15 15  --   METs -- 2.7 2.2 2.8 --     Track   Laps 23 12 14 11  --   Minutes 30 15 15 15  --   METs 2.44 2.73 2.75 2.37 --      Exercise Comments:   Exercise Goals and Review:   Exercise Goals     Row Name 03/23/24 1335             Exercise Goals   Increase Physical Activity Yes       Intervention Provide advice, education, support and counseling about physical activity/exercise needs.;Develop an individualized exercise  prescription for aerobic and resistive training based on initial evaluation findings, risk stratification, comorbidities and participant's personal goals.       Expected Outcomes Short Term: Attend rehab on a regular basis to increase amount of physical activity.;Long Term: Add in home exercise to make exercise part of routine and to increase amount of physical activity.;Long Term: Exercising regularly at least 3-5 days a week.       Increase Strength and Stamina Yes       Intervention Provide advice, education, support and counseling about physical activity/exercise needs.;Develop an individualized exercise prescription for aerobic and resistive training based on initial evaluation findings, risk stratification, comorbidities and participant's personal goals.       Expected Outcomes Short Term: Increase workloads from initial exercise prescription for resistance, speed, and METs.;Short Term: Perform resistance training exercises routinely during rehab and add in resistance training at home;Long Term: Improve cardiorespiratory fitness, muscular endurance and strength as measured by increased METs and functional capacity ( )       Able to understand and use rate of perceived exertion (RPE) scale Yes       Intervention Provide education and explanation on how to use RPE scale       Expected Outcomes Short Term: Able to use RPE daily in rehab to express subjective intensity level;Long Term:  Able to use RPE to guide intensity level when exercising independently       Able to understand and use Dyspnea scale Yes       Intervention Provide education and explanation on how to use Dyspnea scale       Expected Outcomes Short Term: Able to use Dyspnea scale daily in rehab to express subjective sense of shortness of breath during exertion;Long Term: Able to use Dyspnea scale to guide intensity level when exercising independently       Knowledge and understanding of Target Heart Rate Range (THRR) Yes        Intervention Provide education and explanation of THRR including how the numbers were predicted and where they are located for reference       Expected Outcomes Short Term:  Able to state/look up THRR;Long Term: Able to use THRR to govern intensity when exercising independently;Short Term: Able to use daily as guideline for intensity in rehab       Understanding of Exercise Prescription Yes       Intervention Provide education, explanation, and written materials on patient's individual exercise prescription       Expected Outcomes Short Term: Able to explain program exercise prescription;Long Term: Able to explain home exercise prescription to exercise independently          Exercise Goals Re-Evaluation :  Exercise Goals Re-Evaluation     Row Name 03/28/24 1453 04/26/24 0941 05/25/24 1430         Exercise Goal Re-Evaluation   Exercise Goals Review Increase Physical Activity;Able to understand and use Dyspnea scale;Understanding of Exercise Prescription;Increase Strength and Stamina;Knowledge and understanding of Target Heart Rate Range (THRR);Able to understand and use rate of perceived exertion (RPE) scale Increase Physical Activity;Able to understand and use Dyspnea scale;Understanding of Exercise Prescription;Increase Strength and Stamina;Knowledge and understanding of Target Heart Rate Range (THRR);Able to understand and use rate of perceived exertion (RPE) scale Increase Physical Activity;Able to understand and use Dyspnea scale;Understanding of Exercise Prescription;Increase Strength and Stamina;Knowledge and understanding of Target Heart Rate Range (THRR);Able to understand and use rate of perceived exertion (RPE) scale     Comments Pt to begin exercise 03/29/24. Will progress as tolerated. Pt has completed 6 exercise sessions. She is walking the track for 15 min, 2.25 METs. She has claudication symptoms and needs to rest x3 in 15 min. She then exercises on the recumbent stepper for 15 min,  level 2, METs 2.9. She is performing warm up and cool down standing, using red bands, 3.7 lbs. Will progress as tolerated. Pt has completed 9 exercise sessions however she went in to AFib 12/9 and has been on hold until her appointment with cardiology. She is walking the track for 15 min, 2.25 METs. She has claudication symptoms and needs to rest x3 in 15 min. She then exercises on the recumbent stepper for 15 min, level 2, METs 2.8. She is performing warm up and cool down standing, using red bands, 3.7 lbs. Will progress as tolerated.     Expected Outcomes Through exercise at rehab and home, the patient will decrease shortness of breath with daily activities and feel confident in carrying out an exercise regimen at home. Through exercise at rehab and home, the patient will decrease shortness of breath with daily activities and feel confident in carrying out an exercise regimen at home. Through exercise at rehab and home, the patient will decrease shortness of breath with daily activities and feel confident in carrying out an exercise regimen at home.        Discharge Exercise Prescription (Final Exercise Prescription Changes):  Exercise Prescription Changes - 05/15/24 1400       Response to Exercise   Blood Pressure (Admit) 118/70    Heart Rate (Admit) 129 bpm   new onset Afib   Oxygen Saturation (Admit) 97 %   RA         Nutrition:  Target Goals: Understanding of nutrition guidelines, daily intake of sodium 1500mg , cholesterol 200mg , calories 30% from fat and 7% or less from saturated fats, daily to have 5 or more servings of fruits and vegetables.  Biometrics:    Nutrition Therapy Plan and Nutrition Goals:   Nutrition Assessments:  MEDIFICTS Score Key: >=70 Need to make dietary changes  40-70 Heart Healthy Diet <=  40 Therapeutic Level Cholesterol Diet   Picture Your Plate Scores: <59 Unhealthy dietary pattern with much room for improvement. 41-50 Dietary pattern unlikely to  meet recommendations for good health and room for improvement. 51-60 More healthful dietary pattern, with some room for improvement.  >60 Healthy dietary pattern, although there may be some specific behaviors that could be improved.    Nutrition Goals Re-Evaluation:   Nutrition Goals Discharge (Final Nutrition Goals Re-Evaluation):   Psychosocial: Target Goals: Acknowledge presence or absence of significant depression and/or stress, maximize coping skills, provide positive support system. Participant is able to verbalize types and ability to use techniques and skills needed for reducing stress and depression.  Initial Review & Psychosocial Screening:  Initial Psych Review & Screening - 03/23/24 1326       Initial Review   Current issues with None Identified      Family Dynamics   Good Support System? Yes    Comments 1 son and 1 daughter      Barriers   Psychosocial barriers to participate in program There are no identifiable barriers or psychosocial needs.      Screening Interventions   Interventions Encouraged to exercise          Quality of Life Scores:  Scores of 19 and below usually indicate a poorer quality of life in these areas.  A difference of  2-3 points is a clinically meaningful difference.  A difference of 2-3 points in the total score of the Quality of Life Index has been associated with significant improvement in overall quality of life, self-image, physical symptoms, and general health in studies assessing change in quality of life.  PHQ-9: Review Flowsheet  More data exists      03/23/2024 09/08/2017 08/19/2016 05/17/2016 06/19/2015  Depression screen PHQ 2/9  Decreased Interest 0 0 0 0 0  Down, Depressed, Hopeless 0 0 0 0 0  PHQ - 2 Score 0 0 0 0 0  Altered sleeping 0 - - - -  Tired, decreased energy 0 - - - -  Change in appetite 0 - - - -  Feeling bad or failure about yourself  0 - - - -  Trouble concentrating 0 - - - -  Moving slowly or  fidgety/restless 0 - - - -  Suicidal thoughts 0 - - - -  PHQ-9 Score 0  - - - -  Difficult doing work/chores Not difficult at all - - - -    Details       Data saved with a previous flowsheet row definition        Interpretation of Total Score  Total Score Depression Severity:  1-4 = Minimal depression, 5-9 = Mild depression, 10-14 = Moderate depression, 15-19 = Moderately severe depression, 20-27 = Severe depression   Psychosocial Evaluation and Intervention:  Psychosocial Evaluation - 03/23/24 1328       Psychosocial Evaluation & Interventions   Interventions Encouraged to exercise with the program and follow exercise prescription    Comments Ashley Mayer denies any psychosocial barriers at this time.    Expected Outcomes For Ashley Mayer to participate in rehab free of concerns.    Continue Psychosocial Services  No Follow up required          Psychosocial Re-Evaluation:  Psychosocial Re-Evaluation     Row Name 03/30/24 1404 04/23/24 1137 05/21/24 1530         Psychosocial Re-Evaluation   Current issues with None Identified None Identified None Identified  Comments Monthly psychosocial re-evaluation as follows: No changes since orientation. Ashley Mayer has completed 1 session so far. At the time of orientation, she denied any resources, needs or referrals for her psy/soc health. Monthly psychosocial re-evaluation as follows: No changes since last assessment. Ashley Mayer continues to deny any resources, needs or referrals for her psy/soc health. Monthly psychosocial re-evaluation as follows:  Ashley Mayer was found to be in Afib with RVR at [facility] last week. An appointment was made the next day with cardiology. The following day Ashley Mayer went to the ER for her Afib. She is still hospitalized at this time. Prior to hospitalization Ashley Mayer denied any psy/soc barriers or concerns. 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 We will follow up with Ashley Mayer when she  is discharged from the hospital.     Expected Outcomes For Pat to continue to participate in rehab free of concerns or barriers. For Pat to continue to participate in rehab free of concerns or barriers. For Pat to continue to participate in rehab free of concerns or barriers. To be discharged from the hospital and return to [facility]     Interventions Encouraged to attend Pulmonary Rehabilitation for the exercise Encouraged to attend Pulmonary Rehabilitation for the exercise Encouraged to attend Pulmonary Rehabilitation for the exercise     Continue Psychosocial Services  No Follow up required No Follow up required Follow up required by staff        Psychosocial Discharge (Final Psychosocial Re-Evaluation):  Psychosocial Re-Evaluation - 05/21/24 1530       Psychosocial Re-Evaluation   Current issues with None Identified    Comments Monthly psychosocial re-evaluation as follows:  Ashley Mayer was found to be in Afib with R[facility] at [facility] last week. An appointment was made the next day with cardiology. The following day Ashley Mayer went to the ER for her Afib. She is still hospitalized at this time. Prior to hospitalization Ashley Mayer denied any psy/soc barriers or concerns. 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 We will follow up with Ashley Mayer when she is discharged from the hospital.    Expected Outcomes For Pat to continue to participate in rehab free of concerns or barriers. To be discharged from the hospital and return to [facility]    Interventions Encouraged to attend Pulmonary Rehabilitation for the exercise    Continue Psychosocial Services  Follow up required by staff          Education: Education Goals: Education classes will be provided on a weekly basis, covering required topics. Participant will state understanding/return demonstration of topics presented.  Learning Barriers/Preferences:  Learning Barriers/Preferences - 03/23/24 1402        Learning Barriers/Preferences   Learning Barriers None    Learning Preferences None          Education Topics: Know Your Numbers Group instruction that is supported by a PowerPoint presentation. Instructor discusses importance of knowing and understanding resting, exercise, and post-exercise oxygen saturation, heart rate, and blood pressure. Oxygen saturation, heart rate, blood pressure, rating of perceived exertion, and dyspnea are reviewed along with a normal range for these values.    Exercise for the Pulmonary Patient Group instruction that is supported by a PowerPoint presentation. Instructor discusses benefits of exercise, core components of exercise, frequency, duration, and intensity of an exercise routine, importance of utilizing pulse oximetry during exercise, safety while exercising, and  options of places to exercise outside of rehab.    MET Level  Group instruction provided by PowerPoint, verbal discussion, and written material to support subject matter. Instructor reviews what METs are and how to increase METs.  Flowsheet Row PULMONARY REHAB OTHER RESPIRATORY from 04/12/2024 in [facility], Vascular, & Lung [facility]  Date 04/12/24  Educator EP  Instruction Review Code 1- Verbalizes Understanding    Pulmonary Medications Verbally interactive group education provided by instructor with focus on inhaled medications and proper administration.   Anatomy and Physiology of the Respiratory System Group instruction provided by PowerPoint, verbal discussion, and written material to support subject matter. Instructor reviews respiratory cycle and anatomical components of the respiratory system and their functions. Instructor also reviews differences in obstructive and restrictive respiratory diseases with examples of each.  Flowsheet Row PULMONARY REHAB OTHER RESPIRATORY from 04/26/2024 in [facility] for Heart, Vascular,  & Lung Health  Date 04/26/24  Educator RT  Instruction Review Code 1- Verbalizes Understanding    Oxygen Safety Group instruction provided by PowerPoint, verbal discussion, and written material to support subject matter. There is an overview of What is Oxygen and Why do we need it.  Instructor also reviews how to create a safe environment for oxygen use, the importance of using oxygen as prescribed, and the risks of noncompliance. There is a brief discussion on traveling with oxygen and resources the patient may utilize.   Oxygen Use Group instruction provided by PowerPoint, verbal discussion, and written material to discuss how supplemental oxygen is prescribed and different types of oxygen supply systems. Resources for more information are provided.    Breathing Techniques Group instruction that is supported by demonstration and informational handouts. Instructor discusses the benefits of pursed lip and diaphragmatic breathing and detailed demonstration on how to perform both.     Risk Factor Reduction Group instruction that is supported by a PowerPoint presentation. Instructor discusses the definition of a risk factor, different risk factors for pulmonary disease, and how the heart and lungs work together.   Pulmonary Diseases Group instruction provided by PowerPoint, verbal discussion, and written material to support subject matter. Instructor gives an overview of the different type of pulmonary diseases. There is also a discussion on risk factors and symptoms as well as ways to manage the diseases. Flowsheet Row PULMONARY REHAB OTHER RESPIRATORY from 04/19/2024 in [facility] for Heart, Vascular, & Lung Health  Date 04/19/24  Educator RT  Instruction Review Code 1- Verbalizes Understanding    Stress and Energy Conservation Group instruction provided by PowerPoint, verbal discussion, and written material to support subject matter. Instructor gives an  overview of stress and the impact it can have on the body. Instructor also reviews ways to reduce stress. There is also a discussion on energy conservation and ways to conserve energy throughout the day.   Warning Signs and Symptoms Group instruction provided by PowerPoint, verbal discussion, and written material to support subject matter. Instructor reviews warning signs and symptoms of stroke, heart attack, cold and flu. Instructor also reviews ways to prevent the spread of infection. Flowsheet Row PULMONARY REHAB OTHER RESPIRATORY from 03/29/2024 in [facility] for Heart, Vascular, & Lung Health  Date 03/29/24  Educator RN  Instruction Review Code 1- Verbalizes Understanding    Other Education Group or individual verbal, written, or video instructions that support the educational goals of the pulmonary rehab program.    Knowledge Questionnaire  Score:  Knowledge Questionnaire Score - 03/23/24 1402       Knowledge Questionnaire Score   Pre Score 14/18          Core Components/Risk Factors/Patient Goals at Admission:  Personal Goals and Risk Factors at Admission - 03/23/24 1330       Core Components/Risk Factors/Patient Goals on Admission   Improve shortness of breath with ADL's Yes    Intervention Provide education, individualized exercise plan and daily activity instruction to help decrease symptoms of SOB with activities of daily living.    Expected Outcomes Short Term: Improve cardiorespiratory fitness to achieve a reduction of symptoms when performing ADLs;Long Term: Be able to perform more ADLs without symptoms or delay the onset of symptoms          Core Components/Risk Factors/Patient Goals Review:   Goals and Risk Factor Review     Row Name 03/30/24 1405 04/23/24 1138 05/21/24 1533         Core Components/Risk Factors/Patient Goals Review   Personal Goals Review Develop more efficient breathing techniques such as purse lipped breathing  and diaphragmatic breathing and practicing self-pacing with activity.;Improve shortness of breath with ADL's Develop more efficient breathing techniques such as purse lipped breathing and diaphragmatic breathing and practicing self-pacing with activity.;Improve shortness of breath with ADL's Develop more efficient breathing techniques such as purse lipped breathing and diaphragmatic breathing and practicing self-pacing with activity.;Improve shortness of breath with ADL's     Review Monthly review of patient's Core Components/Risk Factors/Patient Goals are as follows: Unable to assess goals yet. Ashley Mayer has completed 1 session so far. Ashley Mayer will continue to benefit from the PR program for exercise and education. Monthly review of patient's Core Components/Risk Factors/Patient Goals are as follows:  Goal in progress for improving her shortness of breath with ADLs. Ashley Mayer is trying to build up her strength and endurance. She has completed 5 sessions so far and is exercising on the NuStep and walking the track. Her oxygen saturation has been stable on room air. Goal progressing for developing more efficient breathing techniques such as purse lipped breathing and diaphragmatic breathing; and practicing self-pacing with activity. Ashley Mayer needs to be prompted to perform purse lipped breathing while short of breath. We are working on this with her while performing the warmup and while exercising. She is working on diaphragmatic breathing at home. Ashley Mayer will continue to benefit from PR for nutrition, education, exercise, and lifestyle modification. Monthly review of patient's Core Components/Risk Factors/Patient Goals are as follows:  Goal in progress for improving her shortness of breath with ADLs. Ashley Mayer is trying to build up her strength and endurance. She has completed 5 sessions so far and is exercising on the NuStep and walking the track. Her oxygen saturation has been stable on room air. Goal progressing for developing more efficient  breathing techniques such as purse lipped breathing and diaphragmatic breathing; and practicing self-pacing with activity. Ashley Mayer needs to be prompted to perform purse lipped breathing while short of breath. We are working on this with her while performing the warmup and while exercising. She is working on diaphragmatic breathing at home. Ashley Mayer will continue to benefit from PR for nutrition, education, exercise, and lifestyle modification.     Expected Outcomes Pt will show progress toward meeting expected goals and outcomes. Pt will show progress toward meeting expected goals and outcomes. Pt will show progress toward meeting expected goals and outcomes.        Core Components/Risk Factors/Patient Goals at Discharge (Final  Review):   Goals and Risk Factor Review - 05/21/24 1533       Core Components/Risk Factors/Patient Goals Review   Personal Goals Review Develop more efficient breathing techniques such as purse lipped breathing and diaphragmatic breathing and practicing self-pacing with activity.;Improve shortness of breath with ADL's    Review Monthly review of patient's Core Components/Risk Factors/Patient Goals are as follows:  Goal in progress for improving her shortness of breath with ADLs. Ashley Mayer is trying to build up her strength and endurance. She has completed 5 sessions so far and is exercising on the NuStep and walking the track. Her oxygen saturation has been stable on room air. Goal progressing for developing more efficient breathing techniques such as purse lipped breathing and diaphragmatic breathing; and practicing self-pacing with activity. Ashley Mayer needs to be prompted to perform purse lipped breathing while short of breath. We are working on this with her while performing the warmup and while exercising. She is working on diaphragmatic breathing at home. Ashley Mayer will continue to benefit from PR for nutrition, education, exercise, and lifestyle modification.    Expected Outcomes Pt will show progress  toward meeting expected goals and outcomes.          ITP Comments:   Comments: Pt is making expected progress toward Pulmonary Rehab goals after completing 8 session(s). Recommend continued exercise, life style modification, education, and utilization of breathing techniques to increase stamina and strength, while also decreasing shortness of breath with exertion.  Dr. Slater Staff is Medical Director for Pulmonary Rehab at [facility].       [1]  Current Outpatient Medications:    albuterol  (VENTOLIN  HFA) 108 (90 Base) MCG/ACT inhaler, Inhale 2 puffs into the lungs every 6 (six) hours as needed for wheezing or shortness of breath., Disp: 1 each, Rfl: 5   apixaban  (ELIQUIS ) 2.5 MG TABS tablet, Take 1 tablet (2.5 mg total) by mouth 2 (two) times daily., Disp: 60 tablet, Rfl: 11   bisoprolol  (ZEBETA ) 5 MG tablet, Take 1 tablet (5 mg total) by mouth daily., Disp: 30 tablet, Rfl: 0   Cholecalciferol (VITAMIN D3) 1000 units CAPS, Take 1 tablet by mouth daily., Disp: , Rfl:    Cyanocobalamin  (VITAMIN B-12 PO), Takes 2,500 mg by mouth occasionally, Disp: , Rfl:    empagliflozin  (JARDIANCE ) 10 MG TABS tablet, Take 1 tablet (10 mg total) by mouth daily before breakfast., Disp: 30 tablet, Rfl: 11   fluticasone  (FLONASE ) 50 MCG/ACT nasal spray, Place 2 sprays into both nostrils daily. (Patient taking differently: Place 2 sprays into both nostrils daily as needed for allergies or rhinitis.), Disp: 16 g, Rfl: 0   multivitamin-lutein  (OCUVITE-LUTEIN ) CAPS, Take 1 capsule by mouth daily., Disp: , Rfl:    nebivolol  (BYSTOLIC ) 2.5 MG tablet, Take 1 tablet (2.5 mg total) by mouth daily., Disp: 30 tablet, Rfl: 0   pantoprazole  (PROTONIX ) 40 MG tablet, Take 40 mg by mouth daily as needed (indigestion)., Disp: , Rfl:    Probiotic Product (PROBIOTIC DAILY PO), Take 1 tablet by mouth daily at 12 noon., Disp: , Rfl:    Pyridoxine HCl (VITAMIN B6 PO), Takes 100 mg by mouth occasionally, Disp: , Rfl:     rosuvastatin  (CRESTOR ) 20 MG tablet, Take 1 tablet by mouth once daily, Disp: 90 tablet, Rfl: 0   umeclidinium bromide  (INCRUSE ELLIPTA ) 62.5 MCG/ACT AEPB, Inhale 1 puff into the lungs daily., Disp: 30 each, Rfl: 5   VITAMIN A PO, Take 2,400 mcg by mouth daily at 12 noon., Disp: ,  Rfl:    Zinc 50 MG TABS, Take 1 tablet by mouth daily., Disp: , Rfl:  [2]  Social History Tobacco Use  Smoking Status Never  Smokeless Tobacco Never

## 2024-06-01 ENCOUNTER — Other Ambulatory Visit (HOSPITAL_BASED_OUTPATIENT_CLINIC_OR_DEPARTMENT_OTHER): Payer: Self-pay | Admitting: Cardiology

## 2024-06-01 DIAGNOSIS — E782 Mixed hyperlipidemia: Secondary | ICD-10-CM

## 2024-06-01 DIAGNOSIS — I2510 Atherosclerotic heart disease of native coronary artery without angina pectoris: Secondary | ICD-10-CM

## 2024-06-05 ENCOUNTER — Telehealth: Payer: Self-pay | Admitting: Cardiology

## 2024-06-05 ENCOUNTER — Encounter (HOSPITAL_COMMUNITY): Admission: RE | Admit: 2024-06-05 | Source: Ambulatory Visit

## 2024-06-05 NOTE — Telephone Encounter (Signed)
 Patient is because she would like to know when she can resume her normal activities such as driving or working out. Please advise.

## 2024-06-05 NOTE — Telephone Encounter (Signed)
 Will forward to Rosaline RAMAN NP for review, was seen 12/23

## 2024-06-06 ENCOUNTER — Telehealth (HOSPITAL_COMMUNITY): Payer: Self-pay | Admitting: *Deleted

## 2024-06-06 NOTE — Telephone Encounter (Signed)
 Patient called to follow-up on if she is cleared to work and needs a call back.

## 2024-06-06 NOTE — Telephone Encounter (Signed)
 Pt c/o medication issue:  1. Name of Medication:   apixaban  (ELIQUIS ) 2.5 MG TABS tablet    empagliflozin  (JARDIANCE ) 10 MG TABS tablet    2. How are you currently taking this medication (dosage and times per day)? As prescribed   3. Are you having a reaction (difficulty breathing--STAT)? Yes, rash   4. What is your medication issue? Pt states she thinks she is allergic to one of the medications she is on. She had had a rash since her hospital stay 12/11 and has been restless and anxious.

## 2024-06-06 NOTE — Telephone Encounter (Signed)
 Called [name] to discuss return to PR. She did not understand she could have returned to class yesterday and therefore miss her appt. We discussed that we are closed 1/1 but she can return 1/6.   She also voices frustration over the significant rash she has that began while in the hospital. It initially seemed related to telemetry patches and wires however it has increased to over her whole torso. She has used multiple creams. Does not know of any changes in her usual clothing care.  Ashley Mayer BS, ACSM-CEP 06/06/2024 11:19 AM

## 2024-06-06 NOTE — Telephone Encounter (Signed)
 She may participate in activities such as walking and driving as long as she is not having dizziness, lightheadedness, or other symptoms that may increase risk of fall or injury.  Eliquis  was started the day prior to hospital admission and Jardiance  was started during office visit 12/23, which was after admission. Bisoprolol  was started during hospital admission.  Have there been any recent changes in soap, laundry detergent, lotion or any other products she puts on her skin? If not, we can consider changing her medications but would not recommend stopping Eliquis  for any period of time due to stroke risk. If she can ensure no other changes listed above, I can make recommendations for her to switch to a different blood thinner to see if rash and anxiety improve.

## 2024-06-12 ENCOUNTER — Encounter (HOSPITAL_COMMUNITY)
Admission: RE | Admit: 2024-06-12 | Discharge: 2024-06-12 | Disposition: A | Discharge status: Home / Self Care | Source: Ambulatory Visit | Attending: Pulmonary Disease | Admitting: Pulmonary Disease

## 2024-06-12 VITALS — Wt 122.1 lb

## 2024-06-12 DIAGNOSIS — J432 Centrilobular emphysema: Secondary | ICD-10-CM | POA: Insufficient documentation

## 2024-06-12 NOTE — Progress Notes (Signed)
 Daily Session Note  Patient Details  Name: Ashley Mayer MRN: [id number] Date of Birth: 02-02-1941 Referring Provider:   [name] Pulmonary Rehab Walk Test from 03/23/2024 in [facility] for Heart, Vascular, & Lung Health  Referring Provider [name]    Encounter Date: 06/12/2024  Check In:  Session Check In - 06/12/24 1321       Check-In   Supervising physician immediately available to respond to emergencies [facility] MD immediately available    Physician(s) Rosabel Mose, NP    Location [facility]    Staff Present Ronal Levin, RN, Maud Moats, MS, ACSM-CEP, Exercise Physiologist;Joseph Lennon, RN, BSN;December Hedtke Claudene, RT;Randi Reeve BS, ACSM-CEP, Exercise Physiologist    Virtual Visit No    Medication changes reported     No    Fall or balance concerns reported    No    Tobacco Cessation No Change    Warm-up and Cool-down Performed as group-led instruction    Resistance Training Performed Yes    VAD Patient? No    PAD/SET Patient? No      Pain Assessment   Currently in Pain? No/denies    Multiple Pain Sites No          Capillary Blood Glucose: No results found for this or any previous visit (from the past 24 hours).   Exercise Prescription Changes - 06/12/24 1300       Response to Exercise   Blood Pressure (Admit) 118/60    Blood Pressure (Exercise) 126/64    Blood Pressure (Exit) 108/72    Heart Rate (Admit) 55 bpm    Heart Rate (Exercise) 73 bpm    Heart Rate (Exit) 57 bpm    Oxygen Saturation (Admit) 99 %    Oxygen Saturation (Exercise) 96 %    Oxygen Saturation (Exit) 97 %    Rating of Perceived Exertion (Exercise) 11    Perceived Dyspnea (Exercise) 3    Duration Continue with 30 min of aerobic exercise without signs/symptoms of physical distress.    Intensity THRR unchanged      Progression   Progression Continue to progress workloads to maintain intensity without signs/symptoms of physical distress.       Resistance Training   Weight red bands    Reps 10-15    Time 10 Minutes      NuStep   Level 2    Minutes 15    METs 2.8      Track   Laps 10    Minutes 15    METs 2.25   215 ft track         Tobacco Use History[1]  Goals Met:  Proper associated with RPD/PD & O2 Sat Independence with exercise equipment Exercise tolerated well No report of concerns or symptoms today Strength training completed today  Goals Unmet:  Not Applicable  Comments: Service time is from 1305 to 1436.    Dr. Slater [name] is Medical Director for Pulmonary Rehab at [facility]     [1]  Social History Tobacco Use  Smoking Status Never  Smokeless Tobacco Never

## 2024-06-14 ENCOUNTER — Encounter (HOSPITAL_COMMUNITY)
Admission: RE | Admit: 2024-06-14 | Discharge: 2024-06-14 | Disposition: A | Discharge status: Home / Self Care | Source: Ambulatory Visit | Attending: Pulmonary Disease | Admitting: Pulmonary Disease

## 2024-06-14 DIAGNOSIS — J432 Centrilobular emphysema: Secondary | ICD-10-CM

## 2024-06-14 NOTE — Progress Notes (Signed)
 Daily Session Note  Patient Details  Name: Ashley Mayer MRN: [id number] Date of Birth: Jan 08, 1941 Referring Provider:   [name] Pulmonary Rehab Walk Test from 03/23/2024 in [facility] for Heart, Vascular, & Lung Health  Referring Provider [name]    Encounter Date: 06/14/2024  Check In:  Session Check In - 06/14/24 1323       Check-In   Supervising physician immediately available to respond to emergencies [facility] MD immediately available    Physician(s) Damien Braver, NP    Location [facility]    Staff Present Ronal Levin, RN, BSN;Casey Claudene, RT;Randi Reeve BS, ACSM-CEP, Exercise Physiologist    Virtual Visit No    Medication changes reported     No    Fall or balance concerns reported    No    Tobacco Cessation No Change    Warm-up and Cool-down Performed as group-led instruction    Resistance Training Performed Yes    VAD Patient? No    PAD/SET Patient? No      Pain Assessment   Currently in Pain? No/denies    Multiple Pain Sites No          Capillary Blood Glucose: No results found for this or any previous visit (from the past 24 hours).    Tobacco Use History[1]  Goals Met:  Independence with exercise equipment Exercise tolerated well Queuing for purse lip breathing No report of concerns or symptoms today Strength training completed today  Goals Unmet:  Not Applicable  Comments: Service time is from 1305 to 1433    Dr. Slater [name] is Medical Director for Pulmonary Rehab at [facility].     [1]  Social History Tobacco Use  Smoking Status Never  Smokeless Tobacco Never

## 2024-06-18 ENCOUNTER — Ambulatory Visit (HOSPITAL_COMMUNITY): Admitting: Internal Medicine

## 2024-06-19 ENCOUNTER — Encounter (HOSPITAL_COMMUNITY)
Admission: RE | Admit: 2024-06-19 | Discharge: 2024-06-19 | Disposition: A | Source: Ambulatory Visit | Attending: Pulmonary Disease

## 2024-06-19 DIAGNOSIS — J432 Centrilobular emphysema: Secondary | ICD-10-CM

## 2024-06-19 NOTE — Progress Notes (Signed)
 Daily Session Note  Patient Details  Name: Ashley Mayer MRN: [id number] Date of Birth: [birth year] Referring Provider:   [name] Pulmonary Rehab Walk Test from 03/23/2024 in [facility] for Heart, Vascular, & Lung Health  Referring Provider [name]    Encounter Date: 06/19/2024  Check In:  Session Check In - 06/19/24 1403       Check-In   Supervising physician immediately available to respond to emergencies [facility] MD immediately available    Physician(s) Rosaline Skains, NP    Location [facility]    Staff Present Ronal Levin, RN, BSN;Casey Claudene, RT;Randi Midge BS, ACSM-CEP, Exercise Physiologist;Kaylee Nicholaus, MS, ACSM-CEP, Exercise Physiologist;Maria Whitaker, RN, BSN    Virtual Visit No    Medication changes reported     No    Fall or balance concerns reported    No    Tobacco Cessation No Change    Warm-up and Cool-down Performed as group-led instruction    Resistance Training Performed Yes    VAD Patient? No    PAD/SET Patient? No      Pain Assessment   Currently in Pain? No/denies    Multiple Pain Sites No          Capillary Blood Glucose: No results found for this or any previous visit (from the past 24 hours).    Tobacco Use History[1]  Goals Met:  Independence with exercise equipment Exercise tolerated well Queuing for purse lip breathing No report of concerns or symptoms today Strength training completed today  Goals Unmet:  Not Applicable  Comments: Service time is from 1304 to 1437    Dr. Slater [name] is Medical Director for Pulmonary Rehab at [facility].     [1]  Social History Tobacco Use  Smoking Status Never  Smokeless Tobacco Never

## 2024-06-20 ENCOUNTER — Ambulatory Visit (HOSPITAL_COMMUNITY)
Admission: RE | Admit: 2024-06-20 | Discharge: 2024-06-20 | Disposition: A | Discharge status: Home / Self Care | Source: Ambulatory Visit | Attending: Internal Medicine | Admitting: Internal Medicine

## 2024-06-20 ENCOUNTER — Encounter (HOSPITAL_COMMUNITY): Payer: Self-pay | Admitting: Nurse Practitioner

## 2024-06-20 ENCOUNTER — Ambulatory Visit (HOSPITAL_COMMUNITY)
Admission: RE | Admit: 2024-06-20 | Discharge: 2024-06-20 | Disposition: A | Discharge status: Home / Self Care | Source: Ambulatory Visit | Attending: Nurse Practitioner | Admitting: Nurse Practitioner

## 2024-06-20 VITALS — BP 136/60 | HR 53 | Ht 60.0 in | Wt 121.2 lb

## 2024-06-20 DIAGNOSIS — I4819 Other persistent atrial fibrillation: Secondary | ICD-10-CM

## 2024-06-20 DIAGNOSIS — I340 Nonrheumatic mitral (valve) insufficiency: Secondary | ICD-10-CM | POA: Diagnosis not present

## 2024-06-20 DIAGNOSIS — I2510 Atherosclerotic heart disease of native coronary artery without angina pectoris: Secondary | ICD-10-CM

## 2024-06-20 DIAGNOSIS — I480 Paroxysmal atrial fibrillation: Secondary | ICD-10-CM

## 2024-06-20 DIAGNOSIS — D6859 Other primary thrombophilia: Secondary | ICD-10-CM | POA: Diagnosis not present

## 2024-06-20 MED ORDER — BISOPROLOL FUMARATE 5 MG PO TABS: 2.5000 mg | ORAL_TABLET | Freq: Every day | ORAL | 2 refills | Status: DC

## 2024-06-20 NOTE — Patient Instructions (Addendum)
 Only take bisoprolol  (ZEBETA )  if HR is 60 or greater  Wear monitor for 14 days remove 07/04/24 and send in the mail

## 2024-06-20 NOTE — Progress Notes (Signed)
 "  Primary Care Physician: Rolinda Millman, MD Primary Cardiologist: Shelda Bruckner, MD Electrophysiologist: None  Referring Physician: ED   Ashley Mayer is a 84 y.o. female with a history of paroxysmal AF (on Eliquis ), HTN, HLD, mild to moderate MR, CKD stage III, celiac disease, emphysema/bronchiectasis, who presents for follow up in the [facility].  The patient was initially diagnosed with atrial fibrillation and recent follow-up with Ashley Bane, NP and was started on Eliquis  at reduced dose with referral placed to AF clinic. She unfortunately went into symptomatic AF with RVR on 05/17/2024 and was admitted and underwent successful TEE cardioversion with TEE read EF 40 to 45%, mildly reduced RV, and severely dilated LA/RA, moderate to severe MR. She was noted to have soft blood pressures and had metoprolol  switched to bisoprolol . She was seen in follow-up on 05/29/2024 and reported some ongoing shortness of breath which was her main symptom associated with AF however EKG shows sinus bradycardia.  She had beta-blocker reduced due to bradycardia to 2.5 mg daily and was advised to follow-up with the AF clinic.  [name] presents today with her son for posthospital follow-up.  On examination she is  sinus bradycardia with rate of 53 bpm.  Blood pressures were stable today at 136/60.  She denies any episodes of heart racing since her discharge and has been compliant with her Eliquis .  She reports that her greatest symptom was shortness of breath when she went into atrial fibrillation.  We discussed strategies for rhythm control and if necessary possibly explore rate control medications.  We also reviewed her stroke risk and indication for needing anticoagulants.  She has experienced frequent bouts of diarrhea since hospitalization.  She was started on Jardiance  as well as Eliquis  and was advised to hold Jardiance  for a few days to see if diarrhea clears up.   She will reach back out to us  if she continues to have diarrhea for possible change in anticoagulant.  She was tested for C. difficile and electrolytes were stable at most recent PCP visit.  She is also drinking Body Armour and staying hydrated during this time.  Today, she denies symptoms of palpitations, chest pain, shortness of breath, orthopnea, PND, lower extremity edema, dizziness, presyncope, syncope, snoring, daytime somnolence, bleeding, or neurologic sequela. The patient is tolerating medications without difficulties and is otherwise without complaint today.   Atrial Fibrillation Management history: Previous antiarrhythmic drugs: None Previous cardioversions: TEE/DCCV 05/2024 Previous ablations: None Anticoagulation history: Eliquis   ROS- All systems are reviewed and negative except as per the HPI above.  Past Medical History:  Diagnosis Date   A-fib [facility])    Anticipatory grief    Arthritis    hands (09/17/2013)   Celiac disease    CKD (chronic kidney disease), stage III (HCC)    Collagenous colitis    Dyspnea on exertion    Estrogen deficiency    GERD (gastroesophageal reflux disease)    Hand arthritis    Hearing loss    Hyperlipidemia    Hypertension    IBS (irritable bowel syndrome)    Low vitamin B12 level    Multiple food allergies    Nocturnal leg cramps    Osteopenia    Sleep disturbance    Unspecified vitamin D  deficiency    Varicose veins of bilateral lower extremities with pain     Current Outpatient Medications  Medication Sig Dispense Refill   albuterol  (VENTOLIN  HFA) 108 (90 Base) MCG/ACT inhaler Inhale 2 puffs  into the lungs every 6 (six) hours as needed for wheezing or shortness of breath. 1 each 5   apixaban  (ELIQUIS ) 2.5 MG TABS tablet Take 1 tablet (2.5 mg total) by mouth 2 (two) times daily. 60 tablet 11   bisoprolol  (ZEBETA ) 5 MG tablet Take 0.5 tablets (2.5 mg total) by mouth daily.     Cholecalciferol (VITAMIN D3) 1000 units CAPS Take 1  tablet by mouth daily.     Cyanocobalamin  (VITAMIN B-12 PO) Takes 2,500 mg by mouth occasionally     empagliflozin  (JARDIANCE ) 10 MG TABS tablet Take 1 tablet (10 mg total) by mouth daily before breakfast. 30 tablet 11   fluticasone  (FLONASE ) 50 MCG/ACT nasal spray Place 2 sprays into both nostrils daily. (Patient taking differently: Place 2 sprays into both nostrils daily as needed for allergies or rhinitis.) 16 g 0   multivitamin-lutein  (OCUVITE-LUTEIN ) CAPS Take 1 capsule by mouth daily.     pantoprazole  (PROTONIX ) 40 MG tablet Take 40 mg by mouth daily as needed (indigestion).     Probiotic Product (PROBIOTIC DAILY PO) Take 1 tablet by mouth daily at 12 noon.     Pyridoxine HCl (VITAMIN B6 PO) Takes 100 mg by mouth occasionally     rosuvastatin  (CRESTOR ) 20 MG tablet Take 1 tablet by mouth once daily 90 tablet 3   umeclidinium bromide  (INCRUSE ELLIPTA ) 62.5 MCG/ACT AEPB Inhale 1 puff into the lungs daily. 30 each 5   VITAMIN A PO Take 2,400 mcg by mouth daily at 12 noon.     Zinc 50 MG TABS Take 1 tablet by mouth daily.     No current facility-administered medications for this visit.    Physical Exam: There were no vitals taken for this visit.  GEN: Well nourished, well developed in no acute distress NECK: No JVD; No carotid bruits CARDIAC: Regular rate and rhythm, no murmurs, rubs, gallops RESPIRATORY:  Clear to auscultation without rales, wheezing or rhonchi  ABDOMEN: Soft, non-tender, non-distended EXTREMITIES:  No edema; No deformity   Wt Readings from Last 3 Encounters:  06/12/24 55.4 kg  05/29/24 57.1 kg  05/17/24 56.7 kg     EKG today demonstrates:   EKG Interpretation Date/Time:    Ventricular Rate:    PR Interval:    QRS Duration:    QT Interval:    QTC Calculation:   R Axis:      Text Interpretation:          Echo Completed 05/18/2024: 1. Left ventricular ejection fraction, by estimation, is 45 to 50%. The  left ventricle has mildly decreased function.  The left ventricle  demonstrates global hypokinesis. Left ventricular diastolic parameters are  indeterminate.   2. Right ventricular systolic function is mildly reduced. The right  ventricular size is mildly enlarged. There is mildly elevated pulmonary  artery systolic pressure. The estimated right ventricular systolic  pressure is 43.3 mmHg.   3. Left atrial size was mildly dilated.   4. Right atrial size was moderately dilated.   5. The mitral valve is abnormal. Mild to moderate mitral valve  regurgitation. No evidence of mitral stenosis.   6. Tricuspid valve regurgitation is mild to moderate.   7. The aortic valve is tricuspid. There is moderate calcification of the  aortic valve. Aortic valve regurgitation is not visualized. No aortic  stenosis is present.   8. The inferior vena cava is normal in size with <50% respiratory  variability, suggesting right atrial pressure of 8 mmHg.   9. The patient  was in atrial fibrillation.    CHA2DS2-VASc Score = 4  The patient's score is based upon: CHF History: 0 HTN History: 0 Diabetes History: 0 Stroke History: 0 Vascular Disease History: 1 Age Score: 2 Gender Score: 1       ASSESSMENT AND PLAN: Paroxysmal Atrial Fibrillation (ICD10:  I48.0) The patient's CHA2DS2-VASc score is 4, indicating a 4.8% annual risk of stroke.   -s/p TEE/DCCV on 05/21/2024 due to new onset AF and has maintained sinus rhythm/sinus bradycardia with no bouts or episodes of tachycardia. She repoprts compliance with Eliquis  and denies any missed doses. -Beta-blockers reduced at last visit due to hypotension and is currently being held for heart rate less than 60. - She will wear a 14-day ZIO monitor to evaluate for AF burden with further recommendations regarding rate and rhythm control based on results. -She will return for 1 month follow-up to discuss results  - Continue Eliquis  2.5 mg twice daily  Continue atenolol as needed and patient encouraged to contact  our office for further questions and ED precautions were also discussed.  Secondary Hypercoagulable State (ICD10:  D68.69) The patient is at significant risk for stroke/thromboembolism based upon her CHA2DS2-VASc Score of 4.  Continue Apixaban  (Eliquis ).   CAD: -s/p CT with calcium  score 573 in 2023 - Patient reports no chest pain or angina since hospitalization.  VHD: - Mild to moderate MR seen on most recent 2D echo.   Signed,  Wyn Raddle, Jackee Shove, NP    06/20/2024 8:37 AM    Follow up with the AF Clinic in 1 month  "

## 2024-06-21 ENCOUNTER — Other Ambulatory Visit (HOSPITAL_BASED_OUTPATIENT_CLINIC_OR_DEPARTMENT_OTHER): Payer: Self-pay

## 2024-06-21 ENCOUNTER — Encounter (HOSPITAL_COMMUNITY)
Admission: RE | Admit: 2024-06-21 | Discharge: 2024-06-21 | Disposition: A | Source: Ambulatory Visit | Attending: Pulmonary Disease

## 2024-06-21 DIAGNOSIS — J432 Centrilobular emphysema: Secondary | ICD-10-CM | POA: Diagnosis not present

## 2024-06-21 NOTE — Progress Notes (Signed)
 Daily Session Note  Patient Details  Name: Ashley Mayer MRN: [id number] Date of Birth: 1941-02-20 Referring Provider:   [name] Pulmonary Rehab Walk Test from 03/23/2024 in [facility]  Referring Provider [name]    Encounter Date: 06/21/2024  Check In:  Session Check In - 06/21/24 1314       Check-In   Supervising physician immediately available to respond to emergencies [facility] MD immediately available    Physician(s) Lum Louis, NP    Location [facility]    Staff Present Ronal Levin, RN, BSN;Deshia Vanderhoof Claudene, RT;Randi [place] BS, ACSM-CEP, Exercise Physiologist;Kaylee [place], MS, ACSM-CEP, Exercise Physiologist    Virtual Visit No    Medication changes reported     No    Fall or balance concerns reported    No    Tobacco Cessation No Change    Warm-up and Cool-down Performed as group-led instruction    Resistance Training Performed Yes    VAD Patient? No    PAD/SET Patient? No      Pain Assessment   Currently in Pain? No/denies    Multiple Pain Sites No          Capillary Blood Glucose: No results found for this or any previous visit (from the past 24 hours).    Tobacco Use History[1]  Goals Met:  Proper associated with RPD/PD & O2 Sat Independence with exercise equipment Exercise tolerated well No report of concerns or symptoms today Strength training completed today  Goals Unmet:  Not Applicable  Comments: Service time is from 1314 to 1434.    Dr. Slater [name] is Medical Director for Pulmonary Rehab at [facility].     [1]  Social History Tobacco Use  Smoking Status Never  Smokeless Tobacco Never  Tobacco Comments   Never smoked 06/20/24

## 2024-06-25 ENCOUNTER — Ambulatory Visit (HOSPITAL_BASED_OUTPATIENT_CLINIC_OR_DEPARTMENT_OTHER): Payer: Self-pay | Admitting: Cardiology

## 2024-06-26 ENCOUNTER — Encounter (HOSPITAL_COMMUNITY)
Admission: RE | Admit: 2024-06-26 | Discharge: 2024-06-26 | Disposition: A | Source: Ambulatory Visit | Attending: Pulmonary Disease

## 2024-06-26 VITALS — Wt 120.4 lb

## 2024-06-26 DIAGNOSIS — J432 Centrilobular emphysema: Secondary | ICD-10-CM | POA: Diagnosis not present

## 2024-06-26 NOTE — Progress Notes (Signed)
 Daily Session Note  Patient Details  Name: Ashley Mayer MRN: [id number] Date of Birth: 1941/03/24 Referring Provider:   [name] Pulmonary Rehab Walk Test from 03/23/2024 in [facility]  Referring Provider [name]    Encounter Date: 06/26/2024  Check In:  Session Check In - 06/26/24 1503       Check-In   Supervising physician immediately available to respond to emergencies [facility] MD immediately available    Physician(s) Lum Louis, NP    Location [facility]    Staff Present Ronal Levin, RN, BSN;Casey Claudene, RT;Randi [place] BS, ACSM-CEP, Exercise Physiologist;Kaylee [place], MS, ACSM-CEP, Exercise Physiologist    Virtual Visit No    Medication changes reported     No    Fall or balance concerns reported    No    Tobacco Cessation No Change    Warm-up and Cool-down Performed as group-led instruction    Resistance Training Performed Yes    VAD Patient? No    PAD/SET Patient? No      Pain Assessment   Currently in Pain? No/denies    Multiple Pain Sites No          Capillary Blood Glucose: No results found for this or any previous visit (from the past 24 hours).   Exercise Prescription Changes - 06/26/24 1500       Response to Exercise   Blood Pressure (Admit) 144/60    Blood Pressure (Exercise) 132/48    Blood Pressure (Exit) 110/54    Heart Rate (Admit) 60 bpm    Heart Rate (Exercise) 95 bpm    Heart Rate (Exit) 69 bpm    Oxygen Saturation (Admit) 99 %    Oxygen Saturation (Exercise) 99 %    Oxygen Saturation (Exit) 99 %    Rating of Perceived Exertion (Exercise) 10    Perceived Dyspnea (Exercise) 1    Duration Continue with 30 min of aerobic exercise without signs/symptoms of physical distress.    Intensity THRR unchanged      Progression   Progression Continue to progress workloads to maintain intensity without signs/symptoms of physical distress.      Resistance  Training   Weight red bands    Reps 10-15    Time 10 Minutes      NuStep   Level 3    SPM 97    Minutes 15    METs 3.3      Track   Laps 10    Minutes 15    METs 2.25   269ft track         Tobacco Use History[1]  Goals Met:  Independence with exercise equipment Exercise tolerated well Queuing for purse lip breathing No report of concerns or symptoms today Strength training completed today  Goals Unmet:  Not Applicable  Comments: Service time is from 1303 to 1430    Dr. Slater [name] is Medical Director for Pulmonary Rehab at [facility].     [1]  Social History Tobacco Use  Smoking Status Never  Smokeless Tobacco Never  Tobacco Comments   Never smoked 06/20/24

## 2024-06-27 ENCOUNTER — Encounter: Payer: Self-pay | Admitting: Gastroenterology

## 2024-06-27 ENCOUNTER — Other Ambulatory Visit (HOSPITAL_BASED_OUTPATIENT_CLINIC_OR_DEPARTMENT_OTHER): Payer: Self-pay

## 2024-06-27 NOTE — Progress Notes (Signed)
 Pulmonary Individual Treatment Plan  Patient Details  Name: Ashley Mayer MRN: [id number] Date of Birth: 01/16/1941 Referring Provider:   [name] Pulmonary Rehab Walk Test from 03/23/2024 in [facility]  Referring Provider [name]    Initial Encounter Date:  Flowsheet Row Pulmonary Rehab Walk Test from 03/23/2024 in [facility]  Date 03/23/24    Visit Diagnosis: Centrilobular emphysema (HCC)  Patient's Home Medications on Admission:  Current Medications[1]  Past Medical History: Past Medical History:  Diagnosis Date   A-fib [facility])    Anticipatory grief    Arthritis    hands (09/17/2013)   Celiac disease    CKD (chronic kidney disease), stage III (HCC)    Collagenous colitis    Dyspnea on exertion    Estrogen deficiency    GERD (gastroesophageal reflux disease)    Hand arthritis    Hearing loss    Hyperlipidemia    Hypertension    IBS (irritable bowel syndrome)    Low vitamin B12 level    Multiple food allergies    Nocturnal leg cramps    Osteopenia    Sleep disturbance    Unspecified vitamin D  deficiency    Varicose veins of bilateral lower extremities with pain     Tobacco Use: Tobacco Use History[2]  Labs: Review Flowsheet  More data exists      Latest Ref Rng & Units 02/17/2023 03/31/2023 07/08/2023 09/09/2023 05/22/2024  Labs for ITP Cardiac and Pulmonary Rehab  Cholestrol 0 - 200 mg/dL 744  827  821  859  893   LDL (calc) 0 - 99 mg/dL 829  98  897  76  46   HDL-C >40 mg/dL 38  43  45  44  36   Trlycerides <150 mg/dL 750  818  820  888  879     Capillary Blood Glucose: Lab Results  Component Value Date   GLUCAP 110 (H) 05/20/2024   GLUCAP 95 05/17/2024   GLUCAP 90 09/18/2013   GLUCAP 95 09/17/2013   GLUCAP 85 09/17/2013     Pulmonary Assessment Scores:  Pulmonary Assessment Scores     Row Name 03/23/24 1332          ADL UCSD   ADL Phase Entry     SOB Score total 13       CAT Score   CAT Score 4       mMRC Score   mMRC Score 1       UCSD: Self-administered rating of dyspnea associated with activities of daily living (ADLs) 6-point scale (0 = not at all to 5 = maximal or unable to do because of breathlessness)  Scoring Scores range from 0 to 120.  Minimally important difference is 5 units  CAT: CAT can identify the health impairment of COPD patients and is better correlated with disease progression.  CAT has a scoring range of zero to 40. The CAT score is classified into four groups of low (less than 10), medium (10 - 20), high (21-30) and very high (31-40) based on the impact level of disease on health status. A CAT score over 10 suggests significant symptoms.  A worsening CAT score could be explained by an exacerbation, poor medication adherence, poor inhaler technique, or progression of COPD or comorbid conditions.  CAT MCID is 2 points  mMRC: mMRC (Modified Medical Research [employer]) Dyspnea Scale is used to assess  the degree of baseline functional disability in patients of respiratory disease due to dyspnea. No minimal important difference is established. A decrease in score of 1 point or greater is considered a positive change.   Pulmonary Function Assessment:  Pulmonary Function Assessment - 03/23/24 1332       Breath   Bilateral Breath Sounds Clear    Shortness of Breath Yes;Limiting activity          Exercise Target Goals: Exercise Program Goal: Individual exercise prescription set using results from initial 6 min walk test and THRR while considering  patients activity barriers and safety.   Exercise Prescription Goal: Initial exercise prescription builds to 30-45 minutes a day of aerobic activity, 2-3 days per week.  Home exercise guidelines will be given to patient during program as part of exercise prescription that the participant will acknowledge.  Activity Barriers  & Risk Stratification:  Activity Barriers & Cardiac Risk Stratification - 03/23/24 1335       Activity Barriers & Cardiac Risk Stratification   Activity Barriers Back Problems;Deconditioning;Muscular Weakness;Shortness of Breath          6 Minute Walk:  6 Minute Walk     Row Name 03/23/24 1421         6 Minute Walk   Phase Initial     Distance 990 feet     Walk Time 6 minutes     # of Rest Breaks 1  2:06-2:15     MPH 1.88     METS 1.62     RPE 13     Perceived Dyspnea  1     VO2 Peak 5.66     Symptoms No     Resting HR 60 bpm     Resting BP 122/60     Resting Oxygen Saturation  99 %     Exercise Oxygen Saturation  during 6 min walk 97 %     Max Ex. HR 106 bpm     Max Ex. BP 128/50     2 Minute Post BP 124/58       Interval HR   1 Minute HR 71     2 Minute HR 84     3 Minute HR 85     4 Minute HR 98     5 Minute HR 97     6 Minute HR 106     2 Minute Post HR 63     Interval Heart Rate? Yes       Interval Oxygen   Interval Oxygen? Yes     Baseline Oxygen Saturation % 99 %     1 Minute Oxygen Saturation % 97 %     1 Minute Liters of Oxygen 0 L     2 Minute Oxygen Saturation % 98 %     2 Minute Liters of Oxygen 0 L     3 Minute Oxygen Saturation % 100 %     3 Minute Liters of Oxygen 0 L     4 Minute Oxygen Saturation % 99 %     4 Minute Liters of Oxygen 0 L     5 Minute Oxygen Saturation % 100 %     5 Minute Liters of Oxygen 0 L     6 Minute Oxygen Saturation % 95 %     6 Minute Liters of Oxygen 0 L     2 Minute Post Oxygen Saturation % 100 %     2 Minute Post Liters of  Oxygen 0 L        Oxygen Initial Assessment:  Oxygen Initial Assessment - 03/23/24 1331       Home Oxygen   Home Oxygen Device None    Sleep Oxygen Prescription None    Home Exercise Oxygen Prescription None    Home Resting Oxygen Prescription None      Initial 6 min Walk   Oxygen Used None      Program Oxygen Prescription   Program Oxygen Prescription None       Intervention   Short Term Goals To learn and understand importance of maintaining oxygen saturations>88%;To learn and demonstrate proper use of respiratory medications;To learn and understand importance of monitoring SPO2 with pulse oximeter and demonstrate accurate use of the pulse oximeter.;To learn and demonstrate proper pursed lip breathing techniques or other breathing techniques.     Long  Term Goals Maintenance of O2 saturations>88%;Compliance with respiratory medication;Verbalizes importance of monitoring SPO2 with pulse oximeter and return demonstration;Exhibits proper breathing techniques, such as pursed lip breathing or other method taught during program session;Demonstrates proper use of MDIs          Oxygen Re-Evaluation:  Oxygen Re-Evaluation     Row Name 03/28/24 1454 04/26/24 0944 05/25/24 1432 06/22/24 1043       Program Oxygen Prescription   Program Oxygen Prescription None None None None      Home Oxygen   Home Oxygen Device None None None None    Sleep Oxygen Prescription None None None None    Home Exercise Oxygen Prescription None None None None    Home Resting Oxygen Prescription None None None None      Goals/Expected Outcomes   Short Term Goals To learn and understand importance of maintaining oxygen saturations>88%;To learn and demonstrate proper use of respiratory medications;To learn and understand importance of monitoring SPO2 with pulse oximeter and demonstrate accurate use of the pulse oximeter.;To learn and demonstrate proper pursed lip breathing techniques or other breathing techniques.  To learn and understand importance of maintaining oxygen saturations>88%;To learn and demonstrate proper use of respiratory medications;To learn and understand importance of monitoring SPO2 with pulse oximeter and demonstrate accurate use of the pulse oximeter.;To learn and demonstrate proper pursed lip breathing techniques or other breathing techniques.  To learn and  understand importance of maintaining oxygen saturations>88%;To learn and demonstrate proper use of respiratory medications;To learn and understand importance of monitoring SPO2 with pulse oximeter and demonstrate accurate use of the pulse oximeter.;To learn and demonstrate proper pursed lip breathing techniques or other breathing techniques.  To learn and understand importance of maintaining oxygen saturations>88%;To learn and demonstrate proper use of respiratory medications;To learn and understand importance of monitoring SPO2 with pulse oximeter and demonstrate accurate use of the pulse oximeter.;To learn and demonstrate proper pursed lip breathing techniques or other breathing techniques.     Long  Term Goals Maintenance of O2 saturations>88%;Compliance with respiratory medication;Verbalizes importance of monitoring SPO2 with pulse oximeter and return demonstration;Exhibits proper breathing techniques, such as pursed lip breathing or other method taught during program session;Demonstrates proper use of MDIs Maintenance of O2 saturations>88%;Compliance with respiratory medication;Verbalizes importance of monitoring SPO2 with pulse oximeter and return demonstration;Exhibits proper breathing techniques, such as pursed lip breathing or other method taught during program session;Demonstrates proper use of MDIs Maintenance of O2 saturations>88%;Compliance with respiratory medication;Verbalizes importance of monitoring SPO2 with pulse oximeter and return demonstration;Exhibits proper breathing techniques, such as pursed lip breathing or other method taught during program session;Demonstrates proper use  of MDIs Maintenance of O2 saturations>88%;Compliance with respiratory medication;Verbalizes importance of monitoring SPO2 with pulse oximeter and return demonstration;Exhibits proper breathing techniques, such as pursed lip breathing or other method taught during program session;Demonstrates proper use of MDIs     Goals/Expected Outcomes Compliance and understanding of oxygen saturation monitoring and breathing techniques to decrease shortness of breath. Compliance and understanding of oxygen saturation monitoring and breathing techniques to decrease shortness of breath. Compliance and understanding of oxygen saturation monitoring and breathing techniques to decrease shortness of breath. Compliance and understanding of oxygen saturation monitoring and breathing techniques to decrease shortness of breath.       Oxygen Discharge (Final Oxygen Re-Evaluation):  Oxygen Re-Evaluation - 06/22/24 1043       Program Oxygen Prescription   Program Oxygen Prescription None      Home Oxygen   Home Oxygen Device None    Sleep Oxygen Prescription None    Home Exercise Oxygen Prescription None    Home Resting Oxygen Prescription None      Goals/Expected Outcomes   Short Term Goals To learn and understand importance of maintaining oxygen saturations>88%;To learn and demonstrate proper use of respiratory medications;To learn and understand importance of monitoring SPO2 with pulse oximeter and demonstrate accurate use of the pulse oximeter.;To learn and demonstrate proper pursed lip breathing techniques or other breathing techniques.     Long  Term Goals Maintenance of O2 saturations>88%;Compliance with respiratory medication;Verbalizes importance of monitoring SPO2 with pulse oximeter and return demonstration;Exhibits proper breathing techniques, such as pursed lip breathing or other method taught during program session;Demonstrates proper use of MDIs    Goals/Expected Outcomes Compliance and understanding of oxygen saturation monitoring and breathing techniques to decrease shortness of breath.          Initial Exercise Prescription:  Initial Exercise Prescription - 03/23/24 1400       Date of Initial Exercise RX and Referring Provider   Date 03/23/24    Referring Provider [name]    Expected Discharge Date  06/29/23      Treadmill   MPH 1.2    Grade 0    Minutes 30    METs 1.5      Prescription Details   Frequency (times per week) 2    Duration Progress to 30 minutes of continuous aerobic without signs/symptoms of physical distress      Intensity   THRR 40-80% of Max Heartrate 54-109    Ratings of Perceived Exertion 11-13    Perceived Dyspnea 0-4      Progression   Progression Continue to progress workloads to maintain intensity without signs/symptoms of physical distress.      Resistance Training   Training Prescription Yes    Weight red bands    Reps 10-15          Perform Capillary Blood Glucose checks as needed.  Exercise Prescription Changes:   Exercise Prescription Changes     Row Name 04/03/24 1500 04/17/24 1500 04/26/24 1509 05/08/24 1508 05/15/24 1400     Response to Exercise   Blood Pressure (Admit) 116/60 140/58 116/60 138/58 118/70   Blood Pressure (Exercise) 148/60 140/60 -- -- --   Blood Pressure (Exit) 120/40 108/68 158/58 120/58 --   Heart Rate (Admit) 64 bpm 73 bpm 76 bpm 70 bpm 129 bpm  new onset Afib   Heart Rate (Exercise) 85 bpm 98 bpm 96 bpm 85 bpm --   Heart Rate (Exit) 76 bpm 75 bpm 76 bpm 77 bpm --  Oxygen Saturation (Admit) 98 % 99 % 96 % 98 % 97 %  RA   Oxygen Saturation (Exercise) 100 % 95 % 99 % 99 % --   Oxygen Saturation (Exit) 99 % 99 % 99 % 98 % --   Rating of Perceived Exertion (Exercise) 13 13 13 13  --   Perceived Dyspnea (Exercise) 0 1 1 1  --   Duration Continue with 30 min of aerobic exercise without signs/symptoms of physical distress. Continue with 30 min of aerobic exercise without signs/symptoms of physical distress. Continue with 30 min of aerobic exercise without signs/symptoms of physical distress. Continue with 30 min of aerobic exercise without signs/symptoms of physical distress. --   Intensity THRR unchanged THRR unchanged THRR unchanged THRR unchanged --     Progression   Progression -- -- -- Continue to progress  workloads to maintain intensity without signs/symptoms of physical distress. --     Paramedic Prescription Yes Yes Yes -- --   Weight red bands red bands red bands red bands --   Reps 10-15 10-15 10-15 10-15 --   Time 10 Minutes 10 Minutes 10 Minutes 10 Minutes --     NuStep   Level -- 2 2 2  --   SPM -- 85 83 96 --   Minutes -- 15 15 15  --   METs -- 2.7 2.2 2.8 --     Track   Laps 23 12 14 11  --   Minutes 30 15 15 15  --   METs 2.44 2.73 2.75 2.37 --    Row Name 06/12/24 1300 06/26/24 1500           Response to Exercise   Blood Pressure (Admit) 118/60 144/60      Blood Pressure (Exercise) 126/64 132/48      Blood Pressure (Exit) 108/72 110/54      Heart Rate (Admit) 55 bpm 60 bpm      Heart Rate (Exercise) 73 bpm 95 bpm      Heart Rate (Exit) 57 bpm 69 bpm      Oxygen Saturation (Admit) 99 % 99 %      Oxygen Saturation (Exercise) 96 % 99 %      Oxygen Saturation (Exit) 97 % 99 %      Rating of Perceived Exertion (Exercise) 11 10      Perceived Dyspnea (Exercise) 3 1      Duration Continue with 30 min of aerobic exercise without signs/symptoms of physical distress. Continue with 30 min of aerobic exercise without signs/symptoms of physical distress.      Intensity THRR unchanged THRR unchanged        Progression   Progression Continue to progress workloads to maintain intensity without signs/symptoms of physical distress. Continue to progress workloads to maintain intensity without signs/symptoms of physical distress.        Resistance Training   Weight red bands red bands      Reps 10-15 10-15      Time 10 Minutes 10 Minutes        NuStep   Level 2 3      SPM -- 97      Minutes 15 15      METs 2.8 3.3        Track   Laps 10 10      Minutes 15 15      METs 2.25  215 ft track 2.25  214ft track  Exercise Comments:   Exercise Goals and Review:   Exercise Goals     Row Name 03/23/24 1335             Exercise Goals    Increase Physical Activity Yes       Intervention Provide advice, education, support and counseling about physical activity/exercise needs.;Develop an individualized exercise prescription for aerobic and resistive training based on initial evaluation findings, risk stratification, comorbidities and participant's personal goals.       Expected Outcomes Short Term: Attend rehab on a regular basis to increase amount of physical activity.;Long Term: Add in home exercise to make exercise part of routine and to increase amount of physical activity.;Long Term: Exercising regularly at least 3-5 days a week.       Increase Strength and Stamina Yes       Intervention Provide advice, education, support and counseling about physical activity/exercise needs.;Develop an individualized exercise prescription for aerobic and resistive training based on initial evaluation findings, risk stratification, comorbidities and participant's personal goals.       Expected Outcomes Short Term: Increase workloads from initial exercise prescription for resistance, speed, and METs.;Short Term: Perform resistance training exercises routinely during rehab and add in resistance training at home;Long Term: Improve cardiorespiratory fitness, muscular endurance and strength as measured by increased METs and functional capacity ( )       Able to understand and use rate of perceived exertion (RPE) scale Yes       Intervention Provide education and explanation on how to use RPE scale       Expected Outcomes Short Term: Able to use RPE daily in rehab to express subjective intensity level;Long Term:  Able to use RPE to guide intensity level when exercising independently       Able to understand and use Dyspnea scale Yes       Intervention Provide education and explanation on how to use Dyspnea scale       Expected Outcomes Short Term: Able to use Dyspnea scale daily in rehab to express subjective sense of shortness of breath during  exertion;Long Term: Able to use Dyspnea scale to guide intensity level when exercising independently       Knowledge and understanding of Target Heart Rate Range (THRR) Yes       Intervention Provide education and explanation of THRR including how the numbers were predicted and where they are located for reference       Expected Outcomes Short Term: Able to state/look up THRR;Long Term: Able to use THRR to govern intensity when exercising independently;Short Term: Able to use daily as guideline for intensity in rehab       Understanding of Exercise Prescription Yes       Intervention Provide education, explanation, and written materials on patient's individual exercise prescription       Expected Outcomes Short Term: Able to explain program exercise prescription;Long Term: Able to explain home exercise prescription to exercise independently          Exercise Goals Re-Evaluation :  Exercise Goals Re-Evaluation     Row Name 03/28/24 1453 04/26/24 0941 05/25/24 1430 06/22/24 1041       Exercise Goal Re-Evaluation   Exercise Goals Review Increase Physical Activity;Able to understand and use Dyspnea scale;Understanding of Exercise Prescription;Increase Strength and Stamina;Knowledge and understanding of Target Heart Rate Range (THRR);Able to understand and use rate of perceived exertion (RPE) scale Increase Physical Activity;Able to understand and use Dyspnea scale;Understanding of Exercise Prescription;Increase Strength and Stamina;Knowledge  and understanding of Target Heart Rate Range (THRR);Able to understand and use rate of perceived exertion (RPE) scale Increase Physical Activity;Able to understand and use Dyspnea scale;Understanding of Exercise Prescription;Increase Strength and Stamina;Knowledge and understanding of Target Heart Rate Range (THRR);Able to understand and use rate of perceived exertion (RPE) scale Increase Physical Activity;Able to understand and use Dyspnea scale;Understanding of  Exercise Prescription;Increase Strength and Stamina;Knowledge and understanding of Target Heart Rate Range (THRR);Able to understand and use rate of perceived exertion (RPE) scale    Comments Pt to begin exercise 03/29/24. Will progress as tolerated. Pt has completed 6 exercise sessions. She is walking the track for 15 min, 2.25 METs. She has claudication symptoms and needs to rest x3 in 15 min. She then exercises on the recumbent stepper for 15 min, level 2, METs 2.9. She is performing warm up and cool down standing, using red bands, 3.7 lbs. Will progress as tolerated. Pt has completed 9 exercise sessions however she went in to AFib 12/9 and has been on hold until her appointment with cardiology. She is walking the track for 15 min, 2.25 METs. She has claudication symptoms and needs to rest x3 in 15 min. She then exercises on the recumbent stepper for 15 min, level 2, METs 2.8. She is performing warm up and cool down standing, using red bands, 3.7 lbs. Will progress as tolerated. Pt has completed 13 exercise sessions. She missed over 1 month due to her Afib incident. She is walking the track for 15 min, 2 METs. She has claudication symptoms and needs to rest.. She then exercises on the recumbent stepper for 15 min, level 2, METs 2.9. She is performing warm up and cool down standing, using red bands, 3.7 lbs. She was encouraged to do blue bands, 5.8 lbs, however currently she is declining them. Will progress as tolerated.    Expected Outcomes Through exercise at rehab and home, the patient will decrease shortness of breath with daily activities and feel confident in carrying out an exercise regimen at home. Through exercise at rehab and home, the patient will decrease shortness of breath with daily activities and feel confident in carrying out an exercise regimen at home. Through exercise at rehab and home, the patient will decrease shortness of breath with daily activities and feel confident in carrying out an  exercise regimen at home. Through exercise at rehab and home, the patient will decrease shortness of breath with daily activities and feel confident in carrying out an exercise regimen at home.       Discharge Exercise Prescription (Final Exercise Prescription Changes):  Exercise Prescription Changes - 06/26/24 1500       Response to Exercise   Blood Pressure (Admit) 144/60    Blood Pressure (Exercise) 132/48    Blood Pressure (Exit) 110/54    Heart Rate (Admit) 60 bpm    Heart Rate (Exercise) 95 bpm    Heart Rate (Exit) 69 bpm    Oxygen Saturation (Admit) 99 %    Oxygen Saturation (Exercise) 99 %    Oxygen Saturation (Exit) 99 %    Rating of Perceived Exertion (Exercise) 10    Perceived Dyspnea (Exercise) 1    Duration Continue with 30 min of aerobic exercise without signs/symptoms of physical distress.    Intensity THRR unchanged      Progression   Progression Continue to progress workloads to maintain intensity without signs/symptoms of physical distress.      Resistance Training   Weight red bands  Reps 10-15    Time 10 Minutes      NuStep   Level 3    SPM 97    Minutes 15    METs 3.3      Track   Laps 10    Minutes 15    METs 2.25   223ft track         Nutrition:  Target Goals: Understanding of nutrition guidelines, daily intake of sodium 1500mg , cholesterol 200mg , calories 30% from fat and 7% or less from saturated fats, daily to have 5 or more servings of fruits and vegetables.  Biometrics:    Nutrition Therapy Plan and Nutrition Goals:   Nutrition Assessments:  MEDIFICTS Score Key: >=70 Need to make dietary changes  40-70 Heart Healthy Diet <= 40 Therapeutic Level Cholesterol Diet   Picture Your Plate Scores: <59 Unhealthy dietary pattern with much room for improvement. 41-50 Dietary pattern unlikely to meet recommendations for good health and room for improvement. 51-60 More healthful dietary pattern, with some room for improvement.   >60 Healthy dietary pattern, although there may be some specific behaviors that could be improved.    Nutrition Goals Re-Evaluation:   Nutrition Goals Discharge (Final Nutrition Goals Re-Evaluation):   Psychosocial: Target Goals: Acknowledge presence or absence of significant depression and/or stress, maximize coping skills, provide positive support system. Participant is able to verbalize types and ability to use techniques and skills needed for reducing stress and depression.  Initial Review & Psychosocial Screening:  Initial Psych Review & Screening - 03/23/24 1326       Initial Review   Current issues with None Identified      Family Dynamics   Good Support System? Yes    Comments 1 son and 1 daughter      Barriers   Psychosocial barriers to participate in program There are no identifiable barriers or psychosocial needs.      Screening Interventions   Interventions Encouraged to exercise          Quality of Life Scores:  Scores of 19 and below usually indicate a poorer quality of life in these areas.  A difference of  2-3 points is a clinically meaningful difference.  A difference of 2-3 points in the total score of the Quality of Life Index has been associated with significant improvement in overall quality of life, self-image, physical symptoms, and general health in studies assessing change in quality of life.  PHQ-9: Review Flowsheet  More data exists      03/23/2024 09/08/2017 08/19/2016 05/17/2016 06/19/2015  Depression screen PHQ 2/9  Decreased Interest 0 0 0 0 0  Down, Depressed, Hopeless 0 0 0 0 0  PHQ - 2 Score 0 0 0 0 0  Altered sleeping 0 - - - -  Tired, decreased energy 0 - - - -  Change in appetite 0 - - - -  Feeling bad or failure about yourself  0 - - - -  Trouble concentrating 0 - - - -  Moving slowly or fidgety/restless 0 - - - -  Suicidal thoughts 0 - - - -  PHQ-9 Score 0  - - - -  Difficult doing work/chores Not difficult at all - - - -     Details       Data saved with a previous flowsheet row definition        Interpretation of Total Score  Total Score Depression Severity:  1-4 = Minimal depression, 5-9 = Mild depression, 10-14 =  Moderate depression, 15-19 = Moderately severe depression, 20-27 = Severe depression   Psychosocial Evaluation and Intervention:  Psychosocial Evaluation - 03/23/24 1328       Psychosocial Evaluation & Interventions   Interventions Encouraged to exercise with the program and follow exercise prescription    Comments Ashley Mayer denies any psychosocial barriers at this time.    Expected Outcomes For Ashley Mayer to participate in rehab free of concerns.    Continue Psychosocial Services  No Follow up required          Psychosocial Re-Evaluation:  Psychosocial Re-Evaluation     Row Name 03/30/24 1404 04/23/24 1137 05/21/24 1530 06/22/24 1457       Psychosocial Re-Evaluation   Current issues with None Identified None Identified None Identified None Identified    Comments Monthly psychosocial re-evaluation as follows: No changes since orientation. Ashley Mayer has completed 1 session so far. At the time of orientation, she denied any resources, needs or referrals for her psy/soc health. Monthly psychosocial re-evaluation as follows: No changes since last assessment. Ashley Mayer continues to deny any resources, needs or referrals for her psy/soc health. Monthly psychosocial re-evaluation as follows:  Ashley Mayer was found to be in Afib with RVR at [facility] last week. An appointment was made the next day with cardiology. The following day Ashley Mayer went to the ER for her Afib. She is still hospitalized at this time. Prior to hospitalization Ashley Mayer denied any psy/soc barriers or concerns. 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 We will follow up with Ashley Mayer when she is discharged from the hospital. Monthly psychosocial re-evaluation as follows: Ashley Mayer had an ablation for Afib and has  attended [facility] since being discharged. Ashley Mayer denies any new psy/soc barriers or concerns. She is still working data processing manager. Ashley Mayer states she has good support from her son. She denies needing any psy/soc education, resources, or referrals.    Expected Outcomes For Ashley Mayer to continue to participate in rehab free of concerns or barriers. For Pat to continue to participate in rehab free of concerns or barriers. For Pat to continue to participate in rehab free of concerns or barriers. To be discharged from the hospital and return to [facility] For Ashley Mayer to continue to participate in rehab free of concerns or barriers.    Interventions Encouraged to attend Pulmonary Rehabilitation for the exercise Encouraged to attend Pulmonary Rehabilitation for the exercise Encouraged to attend Pulmonary Rehabilitation for the exercise Encouraged to attend Pulmonary Rehabilitation for the exercise    Continue Psychosocial Services  No Follow up required No Follow up required Follow up required by staff Follow up required by staff       Psychosocial Discharge (Final Psychosocial Re-Evaluation):  Psychosocial Re-Evaluation - 06/22/24 1457       Psychosocial Re-Evaluation   Current issues with None Identified    Comments Monthly psychosocial re-evaluation as follows: Ashley Mayer had an ablation for Afib and has attended [employer] [employer] since being discharged. Ashley Mayer denies any new psy/soc barriers or concerns. She is still working data processing manager. Ashley Mayer states she has good support from her son. She denies needing any psy/soc education, resources, or referrals.    Expected Outcomes For Ashley Mayer to continue to participate in rehab free of concerns or barriers.    Interventions Encouraged to attend Pulmonary Rehabilitation for the exercise    Continue Psychosocial Services  Follow up required by staff          Education:  Education Goals: Education classes will be provided on a weekly basis, covering required topics. Participant will state  understanding/return demonstration of topics presented.  Learning Barriers/Preferences:  Learning Barriers/Preferences - 03/23/24 1402       Learning Barriers/Preferences   Learning Barriers None    Learning Preferences None          Education Topics: Know Your Numbers Group instruction that is supported by a PowerPoint presentation. Instructor discusses importance of knowing and understanding resting, exercise, and post-exercise oxygen saturation, heart rate, and blood pressure. Oxygen saturation, heart rate, blood pressure, rating of perceived exertion, and dyspnea are reviewed along with a normal range for these values.    Exercise for the Pulmonary Patient Group instruction that is supported by a PowerPoint presentation. Instructor discusses benefits of exercise, core components of exercise, frequency, duration, and intensity of an exercise routine, importance of utilizing pulse oximetry during exercise, safety while exercising, and options of places to exercise outside of rehab.    MET Level  Group instruction provided by PowerPoint, verbal discussion, and written material to support subject matter. Instructor reviews what METs are and how to increase METs.  Flowsheet Row PULMONARY REHAB OTHER RESPIRATORY from 04/12/2024 in [facility] for Heart, Vascular, & Lung Health  Date 04/12/24  Educator EP  Instruction Review Code 1- Verbalizes Understanding    Pulmonary Medications Verbally interactive group education provided by instructor with focus on inhaled medications and proper administration.   Anatomy and Physiology of the Respiratory System Group instruction provided by PowerPoint, verbal discussion, and written material to support subject matter. Instructor reviews respiratory cycle and anatomical components of the respiratory system and their functions. Instructor also reviews differences in obstructive and restrictive respiratory diseases with  examples of each.  Flowsheet Row PULMONARY REHAB OTHER RESPIRATORY from 04/26/2024 in [facility] for Heart, Vascular, & Lung Health  Date 04/26/24  Educator RT  Instruction Review Code 1- Verbalizes Understanding    Oxygen Safety Group instruction provided by PowerPoint, verbal discussion, and written material to support subject matter. There is an overview of What is Oxygen and Why do we need it.  Instructor also reviews how to create a safe environment for oxygen use, the importance of using oxygen as prescribed, and the risks of noncompliance. There is a brief discussion on traveling with oxygen and resources the patient may utilize. Flowsheet Row PULMONARY REHAB OTHER RESPIRATORY from 06/14/2024 in [facility] for Heart, Vascular, & Lung Health  Date 06/14/24  Educator RN  Instruction Review Code 1- Verbalizes Understanding    Oxygen Use Group instruction provided by PowerPoint, verbal discussion, and written material to discuss how supplemental oxygen is prescribed and different types of oxygen supply systems. Resources for more information are provided.  Flowsheet Row PULMONARY REHAB OTHER RESPIRATORY from 06/21/2024 in [facility] for Heart, Vascular, & Lung Health  Date 06/21/24  Educator RT  Instruction Review Code 1- Verbalizes Understanding    Breathing Techniques Group instruction that is supported by demonstration and informational handouts. Instructor discusses the benefits of pursed lip and diaphragmatic breathing and detailed demonstration on how to perform both.     Risk Factor Reduction Group instruction that is supported by a PowerPoint presentation. Instructor discusses the definition of a risk factor, different risk factors for pulmonary disease, and how the heart and lungs work together.   Pulmonary Diseases Group instruction provided by PowerPoint, verbal discussion, and written material  to support  subject matter. Instructor gives an overview of the different type of pulmonary diseases. There is also a discussion on risk factors and symptoms as well as ways to manage the diseases. Flowsheet Row PULMONARY REHAB OTHER RESPIRATORY from 04/19/2024 in [facility] for Heart, Vascular, & Lung Health  Date 04/19/24  Educator RT  Instruction Review Code 1- Verbalizes Understanding    Stress and Energy Conservation Group instruction provided by PowerPoint, verbal discussion, and written material to support subject matter. Instructor gives an overview of stress and the impact it can have on the body. Instructor also reviews ways to reduce stress. There is also a discussion on energy conservation and ways to conserve energy throughout the day.   Warning Signs and Symptoms Group instruction provided by PowerPoint, verbal discussion, and written material to support subject matter. Instructor reviews warning signs and symptoms of stroke, heart attack, cold and flu. Instructor also reviews ways to prevent the spread of infection. Flowsheet Row PULMONARY REHAB OTHER RESPIRATORY from 03/29/2024 in [facility] for Heart, Vascular, & Lung Health  Date 03/29/24  Educator RN  Instruction Review Code 1- Verbalizes Understanding    Other Education Group or individual verbal, written, or video instructions that support the educational goals of the pulmonary rehab program.    Knowledge Questionnaire Score:  Knowledge Questionnaire Score - 03/23/24 1402       Knowledge Questionnaire Score   Pre Score 14/18          Core Components/Risk Factors/Patient Goals at Admission:  Personal Goals and Risk Factors at Admission - 03/23/24 1330       Core Components/Risk Factors/Patient Goals on Admission   Improve shortness of breath with ADL's Yes    Intervention Provide education, individualized exercise plan and daily activity instruction to help  decrease symptoms of SOB with activities of daily living.    Expected Outcomes Short Term: Improve cardiorespiratory fitness to achieve a reduction of symptoms when performing ADLs;Long Term: Be able to perform more ADLs without symptoms or delay the onset of symptoms          Core Components/Risk Factors/Patient Goals Review:   Goals and Risk Factor Review     Row Name 03/30/24 1405 04/23/24 1138 05/21/24 1533 06/22/24 1500       Core Components/Risk Factors/Patient Goals Review   Personal Goals Review Develop more efficient breathing techniques such as purse lipped breathing and diaphragmatic breathing and practicing self-pacing with activity.;Improve shortness of breath with ADL's Develop more efficient breathing techniques such as purse lipped breathing and diaphragmatic breathing and practicing self-pacing with activity.;Improve shortness of breath with ADL's Develop more efficient breathing techniques such as purse lipped breathing and diaphragmatic breathing and practicing self-pacing with activity.;Improve shortness of breath with ADL's Develop more efficient breathing techniques such as purse lipped breathing and diaphragmatic breathing and practicing self-pacing with activity.;Improve shortness of breath with ADL's    Review Monthly review of patient's Core Components/Risk Factors/Patient Goals are as follows: Unable to assess goals yet. Ashley Mayer has completed 1 session so far. Ashley Mayer will continue to benefit from the PR program for exercise and education. Monthly review of patient's Core Components/Risk Factors/Patient Goals are as follows:  Goal in progress for improving her shortness of breath with ADLs. Ashley Mayer is trying to build up her strength and endurance. She has completed 5 sessions so far and is exercising on the NuStep and walking the track. Her oxygen saturation has been stable on room air. Goal progressing for  developing more efficient breathing techniques such as purse lipped breathing  and diaphragmatic breathing; and practicing self-pacing with activity. Ashley Mayer needs to be prompted to perform purse lipped breathing while short of breath. We are working on this with her while performing the warmup and while exercising. She is working on diaphragmatic breathing at home. Ashley Mayer will continue to benefit from PR for nutrition, education, exercise, and lifestyle modification. Monthly review of patient's Core Components/Risk Factors/Patient Goals are as follows:  Goal in progress for improving her shortness of breath with ADLs. Ashley Mayer is trying to build up her strength and endurance. She has completed 5 sessions so far and is exercising on the NuStep and walking the track. Her oxygen saturation has been stable on room air. Goal progressing for developing more efficient breathing techniques such as purse lipped breathing and diaphragmatic breathing; and practicing self-pacing with activity. Ashley Mayer needs to be prompted to perform purse lipped breathing while short of breath. We are working on this with her while performing the warmup and while exercising. She is working on diaphragmatic breathing at home. Ashley Mayer will continue to benefit from PR for nutrition, education, exercise, and lifestyle modification. Monthly review of patient's Core Components/Risk Factors/Patient Goals are as follows: Goal in progress for improving her shortness of breath with ADLs. Ashley Mayer is trying to build up her strength and endurance and her shortness of breath is better due to her ablation. Ashley Mayer is exercising on the NuStep and walking the track. Her oxygen saturation has been stable on room air. Goal met for developing more efficient breathing techniques such as purse lipped breathing and diaphragmatic breathing; and practicing self-pacing with activity. Ashley Mayer can initiate purse lipped breathing while short of breath. She demonstrates this while performing the warmup and while exercising. She works on diaphragmatic breathing at home. Ashley Mayer will  continue to benefit from PR for nutrition, education, exercise, and lifestyle modification.    Expected Outcomes Pt will show progress toward meeting expected goals and outcomes. Pt will show progress toward meeting expected goals and outcomes. Pt will show progress toward meeting expected goals and outcomes. Pt will show progress toward meeting expected goals and outcomes.       Core Components/Risk Factors/Patient Goals at Discharge (Final Review):   Goals and Risk Factor Review - 06/22/24 1500       Core Components/Risk Factors/Patient Goals Review   Personal Goals Review Develop more efficient breathing techniques such as purse lipped breathing and diaphragmatic breathing and practicing self-pacing with activity.;Improve shortness of breath with ADL's    Review Monthly review of patient's Core Components/Risk Factors/Patient Goals are as follows: Goal in progress for improving her shortness of breath with ADLs. Ashley Mayer is trying to build up her strength and endurance and her shortness of breath is better due to her ablation. Ashley Mayer is exercising on the NuStep and walking the track. Her oxygen saturation has been stable on room air. Goal met for developing more efficient breathing techniques such as purse lipped breathing and diaphragmatic breathing; and practicing self-pacing with activity. Ashley Mayer can initiate purse lipped breathing while short of breath. She demonstrates this while performing the warmup and while exercising. She works on diaphragmatic breathing at home. Ashley Mayer will continue to benefit from PR for nutrition, education, exercise, and lifestyle modification.    Expected Outcomes Pt will show progress toward meeting expected goals and outcomes.          ITP Comments:   Comments: Pt is making expected progress toward Pulmonary Rehab goals after completing  14 session(s). Recommend continued exercise, life style modification, education, and utilization of breathing techniques to increase  stamina and strength, while also decreasing shortness of breath with exertion.  Dr. Slater Staff is Medical Director for Pulmonary Rehab at [facility].       [1]  Current Outpatient Medications:    albuterol  (VENTOLIN  HFA) 108 (90 Base) MCG/ACT inhaler, Inhale 2 puffs into the lungs every 6 (six) hours as needed for wheezing or shortness of breath., Disp: 1 each, Rfl: 5   apixaban  (ELIQUIS ) 2.5 MG TABS tablet, Take 1 tablet (2.5 mg total) by mouth 2 (two) times daily., Disp: 60 tablet, Rfl: 11   bisoprolol  (ZEBETA ) 5 MG tablet, Take 0.5 tablets (2.5 mg total) by mouth daily. Take only if HR 60 or greater, Disp: 15 tablet, Rfl: 2   Cholecalciferol (VITAMIN D3) 1000 units CAPS, Take 1 tablet by mouth daily., Disp: , Rfl:    Cyanocobalamin  (VITAMIN B-12 PO), Takes 2,500 mg by mouth occasionally, Disp: , Rfl:    empagliflozin  (JARDIANCE ) 10 MG TABS tablet, Take 1 tablet (10 mg total) by mouth daily before breakfast., Disp: 30 tablet, Rfl: 11   fluticasone  (FLONASE ) 50 MCG/ACT nasal spray, Place 2 sprays into both nostrils daily. (Patient taking differently: Place 2 sprays into both nostrils daily as needed for allergies or rhinitis.), Disp: 16 g, Rfl: 0   multivitamin-lutein  (OCUVITE-LUTEIN ) CAPS, Take 1 capsule by mouth daily., Disp: , Rfl:    pantoprazole  (PROTONIX ) 40 MG tablet, Take 40 mg by mouth daily as needed (indigestion)., Disp: , Rfl:    Probiotic Product (PROBIOTIC DAILY PO), Take 1 tablet by mouth daily at 12 noon., Disp: , Rfl:    Pyridoxine HCl (VITAMIN B6 PO), Takes 100 mg by mouth occasionally, Disp: , Rfl:    rosuvastatin  (CRESTOR ) 20 MG tablet, Take 1 tablet by mouth once daily, Disp: 90 tablet, Rfl: 3   umeclidinium bromide  (INCRUSE ELLIPTA ) 62.5 MCG/ACT AEPB, Inhale 1 puff into the lungs daily., Disp: 30 each, Rfl: 5   VITAMIN A PO, Take 2,400 mcg by mouth daily at 12 noon., Disp: , Rfl:    Zinc 50 MG TABS, Take 1 tablet by mouth daily., Disp: , Rfl:  [2]  Social  History Tobacco Use  Smoking Status Never  Smokeless Tobacco Never  Tobacco Comments   Never smoked 06/20/24

## 2024-06-28 ENCOUNTER — Other Ambulatory Visit (HOSPITAL_BASED_OUTPATIENT_CLINIC_OR_DEPARTMENT_OTHER): Payer: Self-pay

## 2024-06-28 ENCOUNTER — Encounter (HOSPITAL_COMMUNITY)
Admission: RE | Admit: 2024-06-28 | Discharge: 2024-06-28 | Disposition: A | Discharge status: Home / Self Care | Source: Ambulatory Visit | Attending: Pulmonary Disease | Admitting: Pulmonary Disease

## 2024-06-28 DIAGNOSIS — J432 Centrilobular emphysema: Secondary | ICD-10-CM | POA: Diagnosis not present

## 2024-06-28 NOTE — Progress Notes (Signed)
 Daily Session Note  Patient Details  Name: Ashley Mayer MRN: [id number] Date of Birth: 1940/12/26 Referring Provider:   [name] Pulmonary Rehab Walk Test from 03/23/2024 in [facility] for Heart, Vascular, & Lung Health  Referring Provider [name]    Encounter Date: 06/28/2024  Check In:  Session Check In - 06/28/24 1339       Check-In   Supervising physician immediately available to respond to emergencies [facility] MD immediately available    Physician(s) Barnie Press, NP    Location [facility]    Staff Present Ronal Levin, RN, BSN;Casey Claudene, RT;Lashannon Bresnan [facility] BS, ACSM-CEP, Exercise Physiologist;Kaylee Nicholaus, MS, ACSM-CEP, Exercise Physiologist    Virtual Visit No    Medication changes reported     No    Fall or balance concerns reported    No    Tobacco Cessation No Change    Warm-up and Cool-down Performed as group-led instruction    Resistance Training Performed Yes    VAD Patient? No    PAD/SET Patient? No      Pain Assessment   Currently in Pain? No/denies    Multiple Pain Sites No          Capillary Blood Glucose: No results found for this or any previous visit (from the past 24 hours).    Tobacco Use History[1]  Goals Met:  Proper associated with RPD/PD & O2 Sat Independence with exercise equipment Exercise tolerated well No report of concerns or symptoms today Strength training completed today  Goals Unmet:  Not Applicable  Comments: Service time is from 1300 to 1449.    Dr. Slater [name] is Medical Director for Pulmonary Rehab at [facility].     [1]  Social History Tobacco Use  Smoking Status Never  Smokeless Tobacco Never  Tobacco Comments   Never smoked 06/20/24

## 2024-07-02 ENCOUNTER — Telehealth (HOSPITAL_BASED_OUTPATIENT_CLINIC_OR_DEPARTMENT_OTHER): Payer: Self-pay | Admitting: *Deleted

## 2024-07-02 ENCOUNTER — Ambulatory Visit (HOSPITAL_BASED_OUTPATIENT_CLINIC_OR_DEPARTMENT_OTHER): Admitting: Nurse Practitioner
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
## 2024-07-03 ENCOUNTER — Encounter (HOSPITAL_COMMUNITY)

## 2024-07-03 ENCOUNTER — Telehealth (HOSPITAL_COMMUNITY): Payer: Self-pay | Admitting: *Deleted
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
## 2024-07-05 ENCOUNTER — Encounter (HOSPITAL_COMMUNITY)

## 2024-07-10 ENCOUNTER — Encounter (HOSPITAL_COMMUNITY)
Admission: RE | Admit: 2024-07-10 | Discharge: 2024-07-10 | Disposition: A | Source: Ambulatory Visit | Attending: Pulmonary Disease

## 2024-07-10 VITALS — Wt 118.2 lb

## 2024-07-10 DIAGNOSIS — J432 Centrilobular emphysema: Secondary | ICD-10-CM

## 2024-07-11 ENCOUNTER — Other Ambulatory Visit (HOSPITAL_BASED_OUTPATIENT_CLINIC_OR_DEPARTMENT_OTHER): Payer: Self-pay

## 2024-07-11 ENCOUNTER — Other Ambulatory Visit (HOSPITAL_COMMUNITY): Payer: Self-pay

## 2024-07-11 NOTE — Addendum Note (Signed)
 Encounter addended by: Janel Nancy SAUNDERS, RN on: 07/11/2024 4:24 PM  Actions taken: Imaging Exam ended

## 2024-07-12 ENCOUNTER — Encounter (HOSPITAL_BASED_OUTPATIENT_CLINIC_OR_DEPARTMENT_OTHER): Payer: Self-pay | Admitting: Nurse Practitioner

## 2024-07-12 ENCOUNTER — Encounter (HOSPITAL_COMMUNITY)

## 2024-07-12 ENCOUNTER — Ambulatory Visit (HOSPITAL_BASED_OUTPATIENT_CLINIC_OR_DEPARTMENT_OTHER): Admitting: Nurse Practitioner

## 2024-07-12 VITALS — BP 112/50 | HR 56 | Wt 117.0 lb

## 2024-07-12 DIAGNOSIS — I2510 Atherosclerotic heart disease of native coronary artery without angina pectoris: Secondary | ICD-10-CM

## 2024-07-12 DIAGNOSIS — I340 Nonrheumatic mitral (valve) insufficiency: Secondary | ICD-10-CM

## 2024-07-12 DIAGNOSIS — K900 Celiac disease: Secondary | ICD-10-CM | POA: Diagnosis not present

## 2024-07-12 DIAGNOSIS — I739 Peripheral vascular disease, unspecified: Secondary | ICD-10-CM | POA: Diagnosis not present

## 2024-07-12 DIAGNOSIS — E785 Hyperlipidemia, unspecified: Secondary | ICD-10-CM | POA: Diagnosis not present

## 2024-07-12 DIAGNOSIS — I428 Other cardiomyopathies: Secondary | ICD-10-CM

## 2024-07-12 DIAGNOSIS — I5020 Unspecified systolic (congestive) heart failure: Secondary | ICD-10-CM

## 2024-07-12 DIAGNOSIS — I4819 Other persistent atrial fibrillation: Secondary | ICD-10-CM

## 2024-07-12 DIAGNOSIS — D6859 Other primary thrombophilia: Secondary | ICD-10-CM

## 2024-07-12 DIAGNOSIS — R001 Bradycardia, unspecified: Secondary | ICD-10-CM

## 2024-07-12 MED ORDER — BISOPROLOL FUMARATE 5 MG PO TABS: ORAL_TABLET | ORAL | Status: AC

## 2024-07-12 NOTE — Patient Instructions (Addendum)
 Medication Instructions:   HOLD Jardiance  for two weeks. I will call you to check how you are feeling in two weeks.   *If you need a refill on your cardiac medications before your next appointment, please call your pharmacy*  Lab Work:  None ordered.  If you have labs (blood work) drawn today and your tests are completely normal, you will receive your results only by: MyChart Message (if you have MyChart) OR A paper copy in the mail If you have any lab test that is abnormal or we need to change your treatment, we will call you to review the results.  Testing/Procedures:  None ordered.  Follow-Up: At [facility], you and your health needs are our priority.  As part of our continuing mission to provide you with exceptional heart care, our providers are all part of one team.  This team includes your primary Cardiologist (physician) and Advanced Practice Providers or APPs (Physician Assistants and Nurse Practitioners) who all work together to provide you with the care you need, when you need it.  Your next appointment:   1 week(s)  Provider:   You will follow up in the Atrial Fibrillation Clinic located at [facility]. Your provider will be: Jackee Alberts, NP    We recommend signing up for the patient portal called MyChart.  Sign up information is provided on this After Visit Summary.  MyChart is used to connect with patients for Virtual Visits (Telemedicine).  Patients are able to view lab/test results, encounter notes, upcoming appointments, etc.  Non-urgent messages can be sent to your provider as well.   To learn more about what you can do with MyChart, go to [email or web address].   Other Instructions Peripheral Vascular Consult  You have been referred to Dr. Darron the office will call you to schedule appointment.

## 2024-07-13 ENCOUNTER — Ambulatory Visit (HOSPITAL_COMMUNITY): Payer: Self-pay | Admitting: Nurse Practitioner

## 2024-07-17 ENCOUNTER — Encounter (HOSPITAL_COMMUNITY)

## 2024-07-18 ENCOUNTER — Ambulatory Visit (HOSPITAL_COMMUNITY): Admitting: Nurse Practitioner

## 2024-07-19 ENCOUNTER — Encounter (HOSPITAL_COMMUNITY)

## 2024-07-24 ENCOUNTER — Encounter (HOSPITAL_COMMUNITY)

## 2024-07-24 ENCOUNTER — Ambulatory Visit: Admitting: Gastroenterology

## 2024-07-26 ENCOUNTER — Encounter (HOSPITAL_COMMUNITY)

## 2024-07-31 ENCOUNTER — Encounter (HOSPITAL_COMMUNITY)

## 2024-08-02 ENCOUNTER — Encounter (HOSPITAL_COMMUNITY)

## 2024-08-07 ENCOUNTER — Encounter (HOSPITAL_COMMUNITY)

## 2024-08-17 ENCOUNTER — Ambulatory Visit (HOSPITAL_BASED_OUTPATIENT_CLINIC_OR_DEPARTMENT_OTHER): Admitting: Cardiology
